# Patient Record
Sex: Female | Born: 1955 | Race: White | Hispanic: No | Marital: Single | Smoking: Never smoker
Health system: Southern US, Community
[De-identification: ages and names within clinical notes are randomized; demographics above are authoritative.]

## PROBLEM LIST (undated history)

## (undated) DIAGNOSIS — G5603 Carpal tunnel syndrome, bilateral upper limbs: Secondary | ICD-10-CM

## (undated) DIAGNOSIS — R7303 Prediabetes: Secondary | ICD-10-CM

## (undated) DIAGNOSIS — Z806 Family history of leukemia: Secondary | ICD-10-CM

## (undated) DIAGNOSIS — C439 Malignant melanoma of skin, unspecified: Secondary | ICD-10-CM

## (undated) DIAGNOSIS — R011 Cardiac murmur, unspecified: Secondary | ICD-10-CM

## (undated) DIAGNOSIS — D509 Iron deficiency anemia, unspecified: Secondary | ICD-10-CM

## (undated) DIAGNOSIS — Z973 Presence of spectacles and contact lenses: Secondary | ICD-10-CM

## (undated) DIAGNOSIS — Z8719 Personal history of other diseases of the digestive system: Secondary | ICD-10-CM

## (undated) DIAGNOSIS — N950 Postmenopausal bleeding: Secondary | ICD-10-CM

## (undated) DIAGNOSIS — M1990 Unspecified osteoarthritis, unspecified site: Secondary | ICD-10-CM

## (undated) DIAGNOSIS — M4722 Other spondylosis with radiculopathy, cervical region: Secondary | ICD-10-CM

## (undated) DIAGNOSIS — K580 Irritable bowel syndrome with diarrhea: Secondary | ICD-10-CM

## (undated) DIAGNOSIS — Z808 Family history of malignant neoplasm of other organs or systems: Secondary | ICD-10-CM

## (undated) DIAGNOSIS — Z923 Personal history of irradiation: Secondary | ICD-10-CM

## (undated) DIAGNOSIS — E7800 Pure hypercholesterolemia, unspecified: Secondary | ICD-10-CM

## (undated) DIAGNOSIS — Z803 Family history of malignant neoplasm of breast: Secondary | ICD-10-CM

## (undated) DIAGNOSIS — Z800 Family history of malignant neoplasm of digestive organs: Secondary | ICD-10-CM

## (undated) HISTORY — DX: Family history of malignant neoplasm of digestive organs: Z80.0

## (undated) HISTORY — DX: Family history of leukemia: Z80.6

## (undated) HISTORY — DX: Family history of malignant neoplasm of breast: Z80.3

## (undated) HISTORY — PX: BREAST BIOPSY: SHX20

## (undated) HISTORY — DX: Family history of malignant neoplasm of other organs or systems: Z80.8

## (undated) HISTORY — PX: TOOTH EXTRACTION: SUR596

## (undated) HISTORY — PX: BREAST FIBROADENOMA SURGERY: SHX580

---

## 1976-10-22 HISTORY — PX: BREAST EXCISIONAL BIOPSY: SUR124

## 2004-12-21 ENCOUNTER — Ambulatory Visit: Payer: Self-pay | Admitting: Obstetrics & Gynecology

## 2005-08-17 ENCOUNTER — Ambulatory Visit: Payer: Self-pay | Admitting: Unknown Physician Specialty

## 2006-02-21 ENCOUNTER — Ambulatory Visit: Payer: Self-pay | Admitting: Unknown Physician Specialty

## 2007-03-11 ENCOUNTER — Ambulatory Visit: Payer: Self-pay | Admitting: Unknown Physician Specialty

## 2008-03-31 ENCOUNTER — Ambulatory Visit: Payer: Self-pay | Admitting: Unknown Physician Specialty

## 2009-04-15 ENCOUNTER — Ambulatory Visit: Payer: Self-pay | Admitting: Unknown Physician Specialty

## 2010-04-14 DIAGNOSIS — D039 Melanoma in situ, unspecified: Secondary | ICD-10-CM

## 2010-04-14 HISTORY — DX: Melanoma in situ, unspecified: D03.9

## 2010-04-28 ENCOUNTER — Ambulatory Visit: Payer: Self-pay | Admitting: Unknown Physician Specialty

## 2010-08-04 ENCOUNTER — Ambulatory Visit: Payer: Self-pay | Admitting: Unknown Physician Specialty

## 2011-09-17 ENCOUNTER — Ambulatory Visit: Payer: Self-pay | Admitting: Unknown Physician Specialty

## 2012-02-19 ENCOUNTER — Ambulatory Visit: Payer: Self-pay | Admitting: Obstetrics and Gynecology

## 2012-09-24 ENCOUNTER — Ambulatory Visit: Payer: Self-pay | Admitting: Family Medicine

## 2012-09-29 ENCOUNTER — Ambulatory Visit: Payer: Self-pay | Admitting: Family Medicine

## 2013-10-12 ENCOUNTER — Ambulatory Visit: Payer: Self-pay | Admitting: Family Medicine

## 2014-11-09 ENCOUNTER — Ambulatory Visit: Payer: Self-pay | Admitting: Nurse Practitioner

## 2015-09-26 ENCOUNTER — Encounter
Admission: RE | Disposition: A | Discharge status: Home / Self Care | Payer: Self-pay | Source: Ambulatory Visit | Attending: Unknown Physician Specialty

## 2015-09-26 ENCOUNTER — Encounter: Payer: Self-pay | Admitting: *Deleted

## 2015-09-26 ENCOUNTER — Ambulatory Visit
Admission: RE | Admit: 2015-09-26 | Discharge: 2015-09-26 | Disposition: A | Discharge status: Home / Self Care | Payer: Federal, State, Local not specified - PPO | Source: Ambulatory Visit | Attending: Unknown Physician Specialty | Admitting: Unknown Physician Specialty

## 2015-09-26 ENCOUNTER — Ambulatory Visit: Payer: Federal, State, Local not specified - PPO | Admitting: Anesthesiology

## 2015-09-26 DIAGNOSIS — Z79899 Other long term (current) drug therapy: Secondary | ICD-10-CM | POA: Diagnosis not present

## 2015-09-26 DIAGNOSIS — Q438 Other specified congenital malformations of intestine: Secondary | ICD-10-CM | POA: Diagnosis not present

## 2015-09-26 DIAGNOSIS — Z1211 Encounter for screening for malignant neoplasm of colon: Secondary | ICD-10-CM | POA: Diagnosis present

## 2015-09-26 DIAGNOSIS — Z7982 Long term (current) use of aspirin: Secondary | ICD-10-CM | POA: Insufficient documentation

## 2015-09-26 DIAGNOSIS — K640 First degree hemorrhoids: Secondary | ICD-10-CM | POA: Diagnosis not present

## 2015-09-26 DIAGNOSIS — Z85820 Personal history of malignant melanoma of skin: Secondary | ICD-10-CM | POA: Insufficient documentation

## 2015-09-26 DIAGNOSIS — Z800 Family history of malignant neoplasm of digestive organs: Secondary | ICD-10-CM | POA: Diagnosis present

## 2015-09-26 HISTORY — DX: Malignant melanoma of skin, unspecified: C43.9

## 2015-09-26 HISTORY — PX: COLONOSCOPY: SHX5424

## 2015-09-26 SURGERY — COLONOSCOPY
Anesthesia: General

## 2015-09-26 MED ORDER — SODIUM CHLORIDE 0.9 % IV SOLN: INTRAVENOUS | Status: DC

## 2015-09-26 MED ORDER — LIDOCAINE HCL (PF) 2 % IJ SOLN
INTRAMUSCULAR | Status: DC | PRN
  Administered 2015-09-26: 50 mg

## 2015-09-26 MED ORDER — PROPOFOL 10 MG/ML IV BOLUS
INTRAVENOUS | Status: DC | PRN
  Administered 2015-09-26: 30 mg via INTRAVENOUS
  Administered 2015-09-26: 20 mg via INTRAVENOUS

## 2015-09-26 MED ORDER — FENTANYL CITRATE (PF) 100 MCG/2ML IJ SOLN
INTRAMUSCULAR | Status: DC | PRN
  Administered 2015-09-26: 50 ug via INTRAVENOUS

## 2015-09-26 MED ORDER — SODIUM CHLORIDE 0.9 % IV SOLN
INTRAVENOUS | Status: DC
  Administered 2015-09-26: 14:00:00 via INTRAVENOUS

## 2015-09-26 MED ORDER — PROPOFOL 500 MG/50ML IV EMUL
INTRAVENOUS | Status: DC | PRN
  Administered 2015-09-26: 100 ug/kg/min via INTRAVENOUS

## 2015-09-26 MED ORDER — MIDAZOLAM HCL 5 MG/5ML IJ SOLN
INTRAMUSCULAR | Status: DC | PRN
  Administered 2015-09-26: 1 mg via INTRAVENOUS

## 2015-09-26 NOTE — Transfer of Care (Signed)
Immediate Anesthesia Transfer of Care Note  Patient: Meghan Harper  Procedure(s) Performed: Procedure(s): COLONOSCOPY (N/A)  Patient Location: PACU  Anesthesia Type:General  Level of Consciousness: sedated  Airway & Oxygen Therapy: Patient Spontanous Breathing and Patient connected to nasal cannula oxygen  Post-op Assessment: Report given to RN and Post -op Vital signs reviewed and stable  Post vital signs: Reviewed and stable  Last Vitals:  Filed Vitals:   09/26/15 1344 09/26/15 1520  BP: 145/72 99/66  Pulse: 72 71  Temp: 37.6 C 36.4 C  Resp: 18 15    Complications: No apparent anesthesia complications

## 2015-09-26 NOTE — Op Note (Signed)
[facility] Patient Name: Meghan Harper Procedure Date: 09/26/2015 2:44 PM MRN: [id number] Account #: [id number] Date of Birth: 08/01/1956 Admit Type: Outpatient Age: 59 Room: [facility] ENDO ROOM 1 Gender: Female Note Status: Finalized Procedure:         Colonoscopy Indications:       Screening in patient at increased risk: Family history of                     1st-degree relative with colorectal cancer Providers:         Manya Silvas, MD Medicines:         Propofol per Anesthesia Complications:     No immediate complications. Procedure:         Pre-Anesthesia Assessment:                    - After reviewing the risks and benefits, the patient was                     deemed in satisfactory condition to undergo the procedure.                    After obtaining informed consent, the colonoscope was                     passed under direct vision. Throughout the procedure, the                     patient's blood pressure, pulse, and oxygen saturations                     were monitored continuously. The Olympus PCF-H180AL                     colonoscope ( S#: [id number] ) was introduced through the                     anus and advanced to the the cecum, identified by                     appendiceal orifice and ileocecal valve. The colonoscopy                     was somewhat difficult due to significant looping.                     Successful completion of the procedure was aided by                     applying abdominal pressure. The patient tolerated the                     procedure well. The quality of the bowel preparation was                     good. Findings:      The colon was long and redundant requiring external abd pressure. Cecum       reched wih certainty.      Internal hemorrhoids were found during endoscopy. The hemorrhoids were       small and Grade I (internal hemorrhoids that do not prolapse).      The exam was otherwise without  abnormality. Impression:        - Internal hemorrhoids.                    -  The examination was otherwise normal.                    - No specimens collected. Recommendation:    - Repeat colonoscopy in 5 years for screening purposes. Manya Silvas, MD 09/26/2015 3:18:58 PM This report has been signed electronically. Number of Addenda: 0 Note Initiated On: 09/26/2015 2:44 PM Scope Withdrawal Time: 0 hours 12 minutes 58 seconds  Total Procedure Duration: 0 hours 22 minutes 41 seconds       [facility]

## 2015-09-26 NOTE — H&P (Signed)
   Primary Care Physician:  No PCP Per Patient Primary Gastroenterologist:  Dr. Vira Agar  Pre-Procedure History & Physical: HPI:  Meghan Harper is a 59 y.o. female is here for an colonoscopy.   Past Medical History  Diagnosis Date  . Melanoma [facility])     Past Surgical History  Procedure Laterality Date  . Breast fibroadenoma surgery      Prior to Admission medications   Medication Sig Start Date End Date Taking? Authorizing Provider  aspirin EC 81 MG tablet Take 81 mg by mouth daily.   Yes Historical Provider, MD  Calcium Carb-Cholecalciferol (CALCIUM 600 + D PO) Take 2 tablets by mouth daily.   Yes Historical Provider, MD  estradiol (VIVELLE-DOT) 0.1 MG/24HR patch Place 1 patch onto the skin 2 (two) times a week.   Yes Historical Provider, MD  ipratropium-albuterol (DUONEB) 0.5-2.5 (3) MG/3ML SOLN Take 3 mLs by nebulization.   Yes Historical Provider, MD  medroxyPROGESTERone (DEPO-PROVERA) 150 MG/ML injection Inject 150 mg into the muscle every 3 (three) months.   Yes Historical Provider, MD  Multiple Vitamin (MULTIVITAMIN) capsule Take 1 capsule by mouth daily.   Yes Historical Provider, MD  naproxen sodium (ANAPROX) 220 MG tablet Take 220 mg by mouth 2 (two) times daily with a meal.   Yes Historical Provider, MD  Omega-3 Fatty Acids (FISH OIL) 1000 MG CAPS Take 1 capsule by mouth daily.   Yes Historical Provider, MD  rosuvastatin (CRESTOR) 5 MG tablet Take 5 mg by mouth daily.   Yes Historical Provider, MD  traZODone (DESYREL) 25 mg TABS tablet Take 25 mg by mouth at bedtime as needed for sleep.   Yes Historical Provider, MD    Allergies as of 08/12/2015  . (Not on File)    History reviewed. No pertinent family history.  Social History   Social History  . Marital Status: Single    Spouse Name: N/A  . Number of Children: N/A  . Years of Education: N/A   Occupational History  . Not on file.   Social History Main Topics  . Smoking status: Never Smoker   . Smokeless  tobacco: Not on file  . Alcohol Use: 0.6 oz/week    1 Glasses of wine per week  . Drug Use: No  . Sexual Activity: Not on file   Other Topics Concern  . Not on file   Social History Narrative    Review of Systems: See HPI, otherwise negative ROS  Physical Exam: BP 145/72 mmHg  Pulse 72  Temp(Src) 99.6 F (37.6 C) (Tympanic)  Resp 18  Ht 5\' 4"  (1.626 m)  Wt 79.833 kg (176 lb)  BMI 30.20 kg/m2  SpO2 100% General:   Alert,  pleasant and cooperative in NAD Head:  Normocephalic and atraumatic. Neck:  Supple; no masses or thyromegaly. Lungs:  Clear throughout to auscultation.    Heart:  Regular rate and rhythm. Abdomen:  Soft, nontender and nondistended. Normal bowel sounds, without guarding, and without rebound.   Neurologic:  Alert and  oriented x4;  grossly normal neurologically.  Impression/Plan: Meghan Harper is here for an colonoscopy to be performed for [facility] in mother  Risks, benefits, limitations, and alternatives regarding  colonoscopy have been reviewed with the patient.  Questions have been answered.  All parties agreeable.   Gaylyn Cheers, MD  09/26/2015, 2:46 PM

## 2015-09-26 NOTE — Anesthesia Preprocedure Evaluation (Signed)
Anesthesia Evaluation  Patient identified by MRN, date of birth, ID band Patient awake    Reviewed: Allergy & Precautions, H&P , NPO status , Patient's Chart, lab work & pertinent test results, reviewed documented beta blocker date and time   Airway Mallampati: II  TM Distance: >3 FB Neck ROM: full    Dental no notable dental hx. (+) Caps   Pulmonary neg pulmonary ROS,    Pulmonary exam normal breath sounds clear to auscultation       Cardiovascular Exercise Tolerance: Good negative cardio ROS Normal cardiovascular exam Rhythm:regular Rate:Normal     Neuro/Psych negative neurological ROS  negative psych ROS   GI/Hepatic negative GI ROS, Neg liver ROS,   Endo/Other  negative endocrine ROS  Renal/GU negative Renal ROS  negative genitourinary   Musculoskeletal   Abdominal   Peds  Hematology negative hematology ROS (+)   Anesthesia Other Findings Past Medical History:   Melanoma ([place])                                               Reproductive/Obstetrics negative OB ROS                             Anesthesia Physical Anesthesia Plan  ASA: I  Anesthesia Plan: General   Post-op Pain Management:    Induction:   Airway Management Planned:   Additional Equipment:   Intra-op Plan:   Post-operative Plan:   Informed Consent: I have reviewed the patients History and Physical, chart, labs and discussed the procedure including the risks, benefits and alternatives for the proposed anesthesia with the patient or authorized representative who has indicated his/her understanding and acceptance.   Dental Advisory Given  Plan Discussed with: Anesthesiologist, CRNA and Surgeon  Anesthesia Plan Comments:         Anesthesia Quick Evaluation

## 2015-09-28 NOTE — Anesthesia Postprocedure Evaluation (Signed)
Anesthesia Post Note  Patient: Meghan Harper  Procedure(s) Performed: Procedure(s) (LRB): COLONOSCOPY (N/A)  Patient location during evaluation: Endoscopy Anesthesia Type: General Level of consciousness: awake and alert Pain management: pain level controlled Vital Signs Assessment: post-procedure vital signs reviewed and stable Respiratory status: spontaneous breathing, nonlabored ventilation, respiratory function stable and patient connected to nasal cannula oxygen Cardiovascular status: blood pressure returned to baseline and stable Postop Assessment: no signs of nausea or vomiting Anesthetic complications: no    Last Vitals:  Filed Vitals:   09/26/15 1540 09/26/15 1550  BP: 125/73 124/72  Pulse: 71 66  Temp:    Resp: 19 18    Last Pain:  Filed Vitals:   09/27/15 0805  PainSc: 0-No pain                 Martha Clan

## 2016-02-27 DIAGNOSIS — E7800 Pure hypercholesterolemia, unspecified: Secondary | ICD-10-CM | POA: Insufficient documentation

## 2016-02-27 DIAGNOSIS — M19042 Primary osteoarthritis, left hand: Secondary | ICD-10-CM | POA: Insufficient documentation

## 2016-02-27 DIAGNOSIS — M4722 Other spondylosis with radiculopathy, cervical region: Secondary | ICD-10-CM | POA: Insufficient documentation

## 2016-04-27 ENCOUNTER — Other Ambulatory Visit: Payer: Self-pay | Admitting: Internal Medicine

## 2016-04-27 DIAGNOSIS — Z1231 Encounter for screening mammogram for malignant neoplasm of breast: Secondary | ICD-10-CM

## 2016-05-15 ENCOUNTER — Ambulatory Visit
Admission: RE | Admit: 2016-05-15 | Discharge: 2016-05-15 | Disposition: A | Discharge status: Home / Self Care | Payer: Federal, State, Local not specified - PPO | Source: Ambulatory Visit | Attending: Internal Medicine | Admitting: Internal Medicine

## 2016-05-15 ENCOUNTER — Other Ambulatory Visit: Payer: Self-pay | Admitting: Internal Medicine

## 2016-05-15 DIAGNOSIS — Z1231 Encounter for screening mammogram for malignant neoplasm of breast: Secondary | ICD-10-CM | POA: Diagnosis present

## 2017-05-05 DIAGNOSIS — J309 Allergic rhinitis, unspecified: Secondary | ICD-10-CM | POA: Insufficient documentation

## 2017-07-29 ENCOUNTER — Ambulatory Visit
Admission: RE | Admit: 2017-07-29 | Discharge: 2017-07-29 | Disposition: A | Discharge status: Home / Self Care | Payer: Federal, State, Local not specified - PPO | Source: Ambulatory Visit | Attending: Internal Medicine | Admitting: Internal Medicine

## 2017-07-29 ENCOUNTER — Other Ambulatory Visit: Payer: Self-pay | Admitting: Internal Medicine

## 2017-07-29 DIAGNOSIS — Z1231 Encounter for screening mammogram for malignant neoplasm of breast: Secondary | ICD-10-CM | POA: Diagnosis present

## 2017-08-02 DIAGNOSIS — R7303 Prediabetes: Secondary | ICD-10-CM | POA: Insufficient documentation

## 2017-08-02 DIAGNOSIS — N950 Postmenopausal bleeding: Secondary | ICD-10-CM | POA: Insufficient documentation

## 2017-08-02 DIAGNOSIS — Z780 Asymptomatic menopausal state: Secondary | ICD-10-CM | POA: Insufficient documentation

## 2017-08-28 ENCOUNTER — Ambulatory Visit: Payer: Federal, State, Local not specified - PPO | Admitting: Anesthesiology

## 2017-08-28 ENCOUNTER — Ambulatory Visit
Admission: RE | Admit: 2017-08-28 | Discharge: 2017-08-28 | Disposition: A | Discharge status: Home / Self Care | Payer: Federal, State, Local not specified - PPO | Source: Ambulatory Visit | Attending: Surgery | Admitting: Surgery

## 2017-08-28 ENCOUNTER — Encounter
Admission: RE | Disposition: A | Discharge status: Home / Self Care | Payer: Self-pay | Source: Ambulatory Visit | Attending: Surgery

## 2017-08-28 DIAGNOSIS — Z79899 Other long term (current) drug therapy: Secondary | ICD-10-CM | POA: Diagnosis not present

## 2017-08-28 DIAGNOSIS — Z820 Family history of epilepsy and other diseases of the nervous system: Secondary | ICD-10-CM | POA: Insufficient documentation

## 2017-08-28 DIAGNOSIS — Z8249 Family history of ischemic heart disease and other diseases of the circulatory system: Secondary | ICD-10-CM | POA: Insufficient documentation

## 2017-08-28 DIAGNOSIS — Z85820 Personal history of malignant melanoma of skin: Secondary | ICD-10-CM | POA: Insufficient documentation

## 2017-08-28 DIAGNOSIS — M1990 Unspecified osteoarthritis, unspecified site: Secondary | ICD-10-CM | POA: Diagnosis not present

## 2017-08-28 DIAGNOSIS — Z888 Allergy status to other drugs, medicaments and biological substances status: Secondary | ICD-10-CM | POA: Insufficient documentation

## 2017-08-28 DIAGNOSIS — G5601 Carpal tunnel syndrome, right upper limb: Secondary | ICD-10-CM | POA: Insufficient documentation

## 2017-08-28 DIAGNOSIS — E785 Hyperlipidemia, unspecified: Secondary | ICD-10-CM | POA: Diagnosis not present

## 2017-08-28 HISTORY — DX: Unspecified osteoarthritis, unspecified site: M19.90

## 2017-08-28 HISTORY — PX: CARPAL TUNNEL RELEASE: SHX101

## 2017-08-28 SURGERY — RELEASE, CARPAL TUNNEL, ENDOSCOPIC
Anesthesia: Regional | Laterality: Right | Wound class: Clean

## 2017-08-28 MED ORDER — MIDAZOLAM HCL 2 MG/2ML IJ SOLN
INTRAMUSCULAR | Status: DC | PRN
  Administered 2017-08-28: 2 mg via INTRAVENOUS

## 2017-08-28 MED ORDER — METOCLOPRAMIDE HCL 5 MG/ML IJ SOLN: 5.0000 mg | Freq: Three times a day (TID) | INTRAMUSCULAR | Status: DC | PRN

## 2017-08-28 MED ORDER — ONDANSETRON HCL 4 MG PO TABS: 4.0000 mg | ORAL_TABLET | Freq: Four times a day (QID) | ORAL | Status: DC | PRN

## 2017-08-28 MED ORDER — ONDANSETRON HCL 4 MG/2ML IJ SOLN: 4.0000 mg | Freq: Once | INTRAMUSCULAR | Status: DC | PRN

## 2017-08-28 MED ORDER — FENTANYL CITRATE (PF) 100 MCG/2ML IJ SOLN
INTRAMUSCULAR | Status: DC | PRN
  Administered 2017-08-28: 100 ug via INTRAVENOUS

## 2017-08-28 MED ORDER — PROPOFOL 500 MG/50ML IV EMUL
INTRAVENOUS | Status: DC | PRN
  Administered 2017-08-28: 100 ug/kg/min via INTRAVENOUS

## 2017-08-28 MED ORDER — BUPIVACAINE HCL (PF) 0.5 % IJ SOLN
INTRAMUSCULAR | Status: DC | PRN
  Administered 2017-08-28: 10 mL

## 2017-08-28 MED ORDER — POTASSIUM CHLORIDE IN NACL 20-0.9 MEQ/L-% IV SOLN: INTRAVENOUS | Status: DC

## 2017-08-28 MED ORDER — OXYCODONE HCL 5 MG PO TABS: 5.0000 mg | ORAL_TABLET | Freq: Once | ORAL | Status: DC | PRN

## 2017-08-28 MED ORDER — LIDOCAINE HCL (CARDIAC) 20 MG/ML IV SOLN
INTRAVENOUS | Status: DC | PRN
  Administered 2017-08-28: 50 mg via INTRAVENOUS

## 2017-08-28 MED ORDER — METOCLOPRAMIDE HCL 5 MG PO TABS: 5.0000 mg | ORAL_TABLET | Freq: Three times a day (TID) | ORAL | Status: DC | PRN

## 2017-08-28 MED ORDER — OXYCODONE HCL 5 MG/5ML PO SOLN: 5.0000 mg | Freq: Once | ORAL | Status: DC | PRN

## 2017-08-28 MED ORDER — LACTATED RINGERS IV SOLN
1000.0000 mL | INTRAVENOUS | Status: DC
  Administered 2017-08-28: 1000 mL via INTRAVENOUS

## 2017-08-28 MED ORDER — DEXTROSE 5 % IV SOLN
2000.0000 mg | Freq: Once | INTRAVENOUS | Status: AC
  Administered 2017-08-28: 2000 mg via INTRAVENOUS

## 2017-08-28 MED ORDER — FENTANYL CITRATE (PF) 100 MCG/2ML IJ SOLN: 25.0000 ug | INTRAMUSCULAR | Status: DC | PRN

## 2017-08-28 MED ORDER — ONDANSETRON HCL 4 MG/2ML IJ SOLN: 4.0000 mg | Freq: Four times a day (QID) | INTRAMUSCULAR | Status: DC | PRN

## 2017-08-28 MED ORDER — TRAMADOL HCL 50 MG PO TABS: 50.0000 mg | ORAL_TABLET | Freq: Four times a day (QID) | ORAL | 0 refills | Status: AC | PRN

## 2017-08-28 SURGICAL SUPPLY — 26 items
BANDAGE ELASTIC 2 LF NS (GAUZE/BANDAGES/DRESSINGS) ×2 IMPLANT
BNDG COHESIVE 4X5 TAN STRL (GAUZE/BANDAGES/DRESSINGS) ×2 IMPLANT
BNDG ESMARK 4X12 TAN STRL LF (GAUZE/BANDAGES/DRESSINGS) ×2 IMPLANT
CHLORAPREP W/TINT 26ML (MISCELLANEOUS) ×2 IMPLANT
CORD BIP STRL DISP 12FT (MISCELLANEOUS) ×2 IMPLANT
COVER LIGHT HANDLE UNIVERSAL (MISCELLANEOUS) ×4 IMPLANT
CUFF TOURNIQUET DUAL PORT 18X3 (MISCELLANEOUS) ×2 IMPLANT
DRAPE SURG 17X11 SM STRL (DRAPES) ×2 IMPLANT
GAUZE PETRO XEROFOAM 1X8 (MISCELLANEOUS) ×2 IMPLANT
GAUZE SPONGE 4X4 12PLY STRL (GAUZE/BANDAGES/DRESSINGS) ×2 IMPLANT
GLOVE BIO SURGEON STRL SZ8 (GLOVE) ×2 IMPLANT
GLOVE INDICATOR 8.0 STRL GRN (GLOVE) ×2 IMPLANT
GOWN STRL REUS W/ TWL LRG LVL3 (GOWN DISPOSABLE) ×1 IMPLANT
GOWN STRL REUS W/ TWL XL LVL3 (GOWN DISPOSABLE) ×1 IMPLANT
GOWN STRL REUS W/TWL LRG LVL3 (GOWN DISPOSABLE) ×1
GOWN STRL REUS W/TWL XL LVL3 (GOWN DISPOSABLE) ×1
KIT CARPAL TUNNEL (MISCELLANEOUS) ×1
KIT ESCP INSRT D SLOT CANN KN (MISCELLANEOUS) ×1 IMPLANT
KIT ROOM TURNOVER OR (KITS) ×2 IMPLANT
NS IRRIG 500ML POUR BTL (IV SOLUTION) ×2 IMPLANT
PACK EXTREMITY ARMC (MISCELLANEOUS) ×2 IMPLANT
STOCKINETTE IMPERVIOUS 9X36 MD (GAUZE/BANDAGES/DRESSINGS) ×2 IMPLANT
STRAP BODY AND KNEE 60X3 (MISCELLANEOUS) ×2 IMPLANT
SUT PROLENE 4 0 PS 2 18 (SUTURE) ×2 IMPLANT
SUT VIC AB 3-0 SH 27 (SUTURE)
SUT VIC AB 3-0 SH 27X BRD (SUTURE) IMPLANT

## 2017-08-28 NOTE — Discharge Instructions (Addendum)
General Anesthesia, Adult, Care After These instructions provide you with information about caring for yourself after your procedure. Your health care provider may also give you more specific instructions. Your treatment has been planned according to current medical practices, but problems sometimes occur. Call your health care provider if you have any problems or questions after your procedure. What can I expect after the procedure? After the procedure, it is common to have:  Vomiting.  A sore throat.  Mental slowness.  It is common to feel:  Nauseous.  Cold or shivery.  Sleepy.  Tired.  Sore or achy, even in parts of your body where you did not have surgery.  Follow these instructions at home: For at least 24 hours after the procedure:  Do not: ? Participate in activities where you could fall or become injured. ? Drive. ? Use heavy machinery. ? Drink alcohol. ? Take sleeping pills or medicines that cause drowsiness. ? Make important decisions or sign legal documents. ? Take care of children on your own.  Rest. Eating and drinking  If you vomit, drink water, juice, or soup when you can drink without vomiting.  Drink enough fluid to keep your urine clear or pale yellow.  Make sure you have little or no nausea before eating solid foods.  Follow the diet recommended by your health care provider. General instructions  Have a responsible adult stay with you until you are awake and alert.  Return to your normal activities as told by your health care provider. Ask your health care provider what activities are safe for you.  Take over-the-counter and prescription medicines only as told by your health care provider.  If you smoke, do not smoke without supervision.  Keep all follow-up visits as told by your health care provider. This is important. Contact a health care provider if:  You continue to have nausea or vomiting at home, and medicines are not helpful.  You  cannot drink fluids or start eating again.  You cannot urinate after 8-12 hours.  You develop a skin rash.  You have fever.  You have increasing redness at the site of your procedure. Get help right away if:  You have difficulty breathing.  You have chest pain.  You have unexpected bleeding.  You feel that you are having a life-threatening or urgent problem. This information is not intended to replace advice given to you by your health care provider. Make sure you discuss any questions you have with your health care provider. Document Released: 01/14/2001 Document Revised: 03/12/2016 Document Reviewed: 09/22/2015 Elsevier Interactive Patient Education  2018 [employer].   Keep dressing dry and intact. Keep hand elevated above heart level. May shower after dressing removed on postop day 4 (Sunday). Cover sutures with Band-Aid after drying off. Apply ice to affected area frequently. Take naproxen 440 mg BID with meals for 7-10 days, then as necessary. Take ES Tylenol or pain medication as prescribed when needed.  Return for follow-up in 10-14 days or as scheduled.

## 2017-08-28 NOTE — Anesthesia Procedure Notes (Signed)
Performed by: Daden Mahany, CRNA Pre-anesthesia Checklist: Patient identified, Emergency Drugs available, Suction available, Timeout performed and Patient being monitored Patient Re-evaluated:Patient Re-evaluated prior to induction Oxygen Delivery Method: Simple face mask Placement Confirmation: positive ETCO2       

## 2017-08-28 NOTE — Transfer of Care (Signed)
Immediate Anesthesia Transfer of Care Note  Patient: Meghan Harper  Procedure(s) Performed: CARPAL TUNNEL RELEASE ENDOSCOPIC (Right )  Patient Location: PACU  Anesthesia Type: Bier Block  Level of Consciousness: awake, alert  and patient cooperative  Airway and Oxygen Therapy: Patient Spontanous Breathing and Patient connected to supplemental oxygen  Post-op Assessment: Post-op Vital signs reviewed, Patient's Cardiovascular Status Stable, Respiratory Function Stable, Patent Airway and No signs of Nausea or vomiting  Post-op Vital Signs: Reviewed and stable  Complications: No apparent anesthesia complications

## 2017-08-28 NOTE — Anesthesia Postprocedure Evaluation (Signed)
Anesthesia Post Note  Patient: Meghan Harper  Procedure(s) Performed: CARPAL TUNNEL RELEASE ENDOSCOPIC (Right )  Patient location during evaluation: PACU Anesthesia Type: Bier Block Level of consciousness: awake and alert Pain management: pain level controlled Vital Signs Assessment: post-procedure vital signs reviewed and stable Respiratory status: spontaneous breathing, nonlabored ventilation, respiratory function stable and patient connected to nasal cannula oxygen Cardiovascular status: stable and blood pressure returned to baseline Postop Assessment: no apparent nausea or vomiting Anesthetic complications: no    Alisa Graff

## 2017-08-28 NOTE — Anesthesia Procedure Notes (Signed)
Anesthesia Regional Block: Bier block (IV Regional)   Pre-Anesthetic Checklist: ,, timeout performed, Correct Patient, Correct Site, Correct Laterality, Correct Procedure, Correct Position, site marked, Risks and benefits discussed, Surgical consent,  Pre-op evaluation,  At surgeon's request  Laterality: Right      Procedures:,,,,, intact distal pulses, Esmarch exsanguination,, #20gu IV placed and double tourniquet utilized  Narrative:  Start time: 08/28/2017 1:44 PM End time: 08/28/2017 1:44 PM Injection made incrementally with aspirations every 40 mL.

## 2017-08-28 NOTE — Op Note (Signed)
08/28/2017  2:22 PM  Patient:   Meghan Harper  Pre-Op Diagnosis:   Right carpal tunnel syndrome.  Post-Op Diagnosis:   Same.  Procedure:   Endoscopic right carpal tunnel release.  Surgeon:   Pascal Lux, MD  Anesthesia:   Bier block  Findings:   As above.  Complications:   None  EBL:   0 cc  Fluids:   700 cc crystalloid  TT:   29 minutes at 250 mmHg  Drains:   None  Closure:   4-0 Prolene interrupted sutures  Brief Clinical Note:   The patient is a 61 year old female with a history of progressively worsening pain and paresthesias of her right hand.  Her symptoms have persisted despite medications, activity modification, splinting, etc.  Her history and examination are consistent with carpal tunnel syndrome confirmed by EMG.  The patient presents at this time for an endoscopic right carpal tunnel release.   Procedure:   The patient was brought into the operating room and lain in the supine position. After adequate IV sedation was achieved, a timeout was performed to verify the appropriate surgical site before a Bier block was placed by the anesthesiologist and the tourniquet inflated to 250 mmHg. The right hand and upper extremity were prepped with ChloraPrep solution before being draped sterilely. Preoperative antibiotics were administered. An approximately 1.5-2 cm incision was made over the volar wrist flexion crease, centered over the palmaris longus tendon. The incision was carried down through the subcutaneous tissues with care taken to identify and protect any neurovascular structures. The distal forearm fascia was penetrated just proximal to the transverse carpal ligament. The soft tissues were released off the superficial and deep surfaces of the distal forearm fascia and this was released proximally for 3-4 cm under direct visualization.  Attention was directed distally. The Soil scientist was passed beneath the transverse carpal ligament along the ulnar aspect of the  carpal tunnel and used to release any adhesions as well as to remove any adherent synovial tissue before first the smaller then the larger of the two dilators were passed beneath the transverse carpal ligament along the ulnar margin of the carpal tunnel. The slotted cannula was introduced and the endoscope was placed into the slotted cannula and the undersurface of the transverse carpal ligament visualized. The distal margin of the transverse carpal ligament was marked by placing a 25-gauge needle percutaneously at Thayne cardinal point so that it entered the distal portion of the slotted cannula. Under endoscopic visualization, the transverse carpal ligament was released from proximal to distal using the end-cutting blade. A second pass was performed to ensure complete release of the ligament. The adequacy of release was verified both endoscopically and by palpation using the freer elevator.  The wound was irrigated thoroughly with sterile saline solution before being closed using 4-0 Prolene interrupted sutures. A total of 10 cc of 0.5% plain Sensorcaine was injected in and around the incision before a sterile bulky dressing was applied to the wound. The patient was placed into a volar wrist splint before being awakened and returned to the recovery room in satisfactory condition after tolerating the procedure well.

## 2017-08-28 NOTE — H&P (Signed)
Paper H&P to be scanned into permanent record. H&P reviewed and patient re-examined. No changes. 

## 2017-08-28 NOTE — Anesthesia Preprocedure Evaluation (Signed)
Anesthesia Evaluation  Patient identified by MRN, date of birth, ID band Patient awake    Reviewed: Allergy & Precautions, H&P , NPO status , Patient's Chart, lab work & pertinent test results, reviewed documented beta blocker date and time   Airway Mallampati: II  TM Distance: >3 FB Neck ROM: full    Dental no notable dental hx.    Pulmonary neg pulmonary ROS,    Pulmonary exam normal breath sounds clear to auscultation       Cardiovascular Exercise Tolerance: Good negative cardio ROS   Rhythm:regular Rate:Normal     Neuro/Psych negative neurological ROS  negative psych ROS   GI/Hepatic negative GI ROS, Neg liver ROS,   Endo/Other  negative endocrine ROS  Renal/GU negative Renal ROS  negative genitourinary   Musculoskeletal  (+) Arthritis ,   Abdominal   Peds  Hematology negative hematology ROS (+)   Anesthesia Other Findings   Reproductive/Obstetrics negative OB ROS                             Anesthesia Physical Anesthesia Plan  ASA: II  Anesthesia Plan: Bier Block and Bier Block-Lidocaine Only   Post-op Pain Management:    Induction:   PONV Risk Score and Plan: 2  Airway Management Planned:   Additional Equipment:   Intra-op Plan:   Post-operative Plan:   Informed Consent: I have reviewed the patients History and Physical, chart, labs and discussed the procedure including the risks, benefits and alternatives for the proposed anesthesia with the patient or authorized representative who has indicated his/her understanding and acceptance.   Dental Advisory Given  Plan Discussed with: CRNA  Anesthesia Plan Comments:         Anesthesia Quick Evaluation

## 2017-08-29 ENCOUNTER — Encounter: Payer: Self-pay | Admitting: Surgery

## 2017-10-31 ENCOUNTER — Encounter: Payer: Self-pay | Admitting: *Deleted

## 2017-10-31 ENCOUNTER — Other Ambulatory Visit: Payer: Self-pay

## 2017-11-06 ENCOUNTER — Ambulatory Visit
Admission: RE | Admit: 2017-11-06 | Discharge: 2017-11-06 | Disposition: A | Discharge status: Home / Self Care | Payer: Federal, State, Local not specified - PPO | Source: Ambulatory Visit | Attending: Surgery | Admitting: Surgery

## 2017-11-06 ENCOUNTER — Ambulatory Visit: Payer: Federal, State, Local not specified - PPO | Admitting: Anesthesiology

## 2017-11-06 ENCOUNTER — Encounter
Admission: RE | Disposition: A | Discharge status: Home / Self Care | Payer: Self-pay | Source: Ambulatory Visit | Attending: Surgery

## 2017-11-06 DIAGNOSIS — G5602 Carpal tunnel syndrome, left upper limb: Secondary | ICD-10-CM | POA: Diagnosis present

## 2017-11-06 DIAGNOSIS — Z79899 Other long term (current) drug therapy: Secondary | ICD-10-CM | POA: Diagnosis not present

## 2017-11-06 DIAGNOSIS — E785 Hyperlipidemia, unspecified: Secondary | ICD-10-CM | POA: Diagnosis not present

## 2017-11-06 DIAGNOSIS — Z85820 Personal history of malignant melanoma of skin: Secondary | ICD-10-CM | POA: Diagnosis not present

## 2017-11-06 HISTORY — DX: Presence of spectacles and contact lenses: Z97.3

## 2017-11-06 HISTORY — PX: CARPAL TUNNEL RELEASE: SHX101

## 2017-11-06 SURGERY — RELEASE, CARPAL TUNNEL, ENDOSCOPIC
Anesthesia: Regional | Laterality: Left | Wound class: Clean

## 2017-11-06 MED ORDER — FENTANYL CITRATE (PF) 100 MCG/2ML IJ SOLN: 25.0000 ug | INTRAMUSCULAR | Status: DC | PRN

## 2017-11-06 MED ORDER — LACTATED RINGERS IV SOLN
10.0000 mL/h | INTRAVENOUS | Status: DC
  Administered 2017-11-06: 10 mL/h via INTRAVENOUS

## 2017-11-06 MED ORDER — METOCLOPRAMIDE HCL 5 MG/ML IJ SOLN: 5.0000 mg | Freq: Three times a day (TID) | INTRAMUSCULAR | Status: DC | PRN

## 2017-11-06 MED ORDER — LACTATED RINGERS IV SOLN: INTRAVENOUS | Status: DC

## 2017-11-06 MED ORDER — POTASSIUM CHLORIDE IN NACL 20-0.9 MEQ/L-% IV SOLN: INTRAVENOUS | Status: DC

## 2017-11-06 MED ORDER — ONDANSETRON HCL 4 MG PO TABS: 4.0000 mg | ORAL_TABLET | Freq: Four times a day (QID) | ORAL | Status: DC | PRN

## 2017-11-06 MED ORDER — ONDANSETRON HCL 4 MG/2ML IJ SOLN: 4.0000 mg | Freq: Four times a day (QID) | INTRAMUSCULAR | Status: DC | PRN

## 2017-11-06 MED ORDER — OXYCODONE HCL 5 MG PO TABS: 5.0000 mg | ORAL_TABLET | Freq: Once | ORAL | Status: DC | PRN

## 2017-11-06 MED ORDER — ACETAMINOPHEN 10 MG/ML IV SOLN: 1000.0000 mg | Freq: Once | INTRAVENOUS | Status: DC | PRN

## 2017-11-06 MED ORDER — FENTANYL CITRATE (PF) 100 MCG/2ML IJ SOLN
INTRAMUSCULAR | Status: DC | PRN
  Administered 2017-11-06 (×2): 50 ug via INTRAVENOUS

## 2017-11-06 MED ORDER — LIDOCAINE HCL (CARDIAC) 20 MG/ML IV SOLN
INTRAVENOUS | Status: DC | PRN
  Administered 2017-11-06: 40 mg via INTRAVENOUS

## 2017-11-06 MED ORDER — METOCLOPRAMIDE HCL 5 MG PO TABS: 5.0000 mg | ORAL_TABLET | Freq: Three times a day (TID) | ORAL | Status: DC | PRN

## 2017-11-06 MED ORDER — BUPIVACAINE HCL (PF) 0.5 % IJ SOLN
INTRAMUSCULAR | Status: DC | PRN
  Administered 2017-11-06: 10 mL

## 2017-11-06 MED ORDER — MIDAZOLAM HCL 5 MG/5ML IJ SOLN
INTRAMUSCULAR | Status: DC | PRN
  Administered 2017-11-06: 2 mg via INTRAVENOUS

## 2017-11-06 MED ORDER — CEFAZOLIN SODIUM-DEXTROSE 2-4 GM/100ML-% IV SOLN
2.0000 g | Freq: Once | INTRAVENOUS | Status: AC
  Administered 2017-11-06: 2 g via INTRAVENOUS

## 2017-11-06 MED ORDER — ONDANSETRON HCL 4 MG/2ML IJ SOLN: 4.0000 mg | Freq: Once | INTRAMUSCULAR | Status: DC | PRN

## 2017-11-06 MED ORDER — OXYCODONE HCL 5 MG/5ML PO SOLN: 5.0000 mg | Freq: Once | ORAL | Status: DC | PRN

## 2017-11-06 MED ORDER — PROPOFOL 500 MG/50ML IV EMUL
INTRAVENOUS | Status: DC | PRN
  Administered 2017-11-06: 50 ug/kg/min via INTRAVENOUS

## 2017-11-06 SURGICAL SUPPLY — 27 items
BANDAGE ELASTIC 2 LF NS (GAUZE/BANDAGES/DRESSINGS) ×2 IMPLANT
BNDG COHESIVE 4X5 TAN STRL (GAUZE/BANDAGES/DRESSINGS) ×2 IMPLANT
BNDG ESMARK 4X12 TAN STRL LF (GAUZE/BANDAGES/DRESSINGS) ×2 IMPLANT
CHLORAPREP W/TINT 26ML (MISCELLANEOUS) ×2 IMPLANT
CORD BIP STRL DISP 12FT (MISCELLANEOUS) ×2 IMPLANT
COVER LIGHT HANDLE UNIVERSAL (MISCELLANEOUS) ×4 IMPLANT
CUFF TOURNIQUET DUAL PORT 18X3 (MISCELLANEOUS) ×2 IMPLANT
DRAPE SURG 17X11 SM STRL (DRAPES) ×2 IMPLANT
GAUZE PETRO XEROFOAM 1X8 (MISCELLANEOUS) ×2 IMPLANT
GAUZE SPONGE 4X4 12PLY STRL (GAUZE/BANDAGES/DRESSINGS) ×2 IMPLANT
GLOVE BIO SURGEON STRL SZ8 (GLOVE) ×2 IMPLANT
GLOVE INDICATOR 8.0 STRL GRN (GLOVE) ×2 IMPLANT
GOWN STRL REUS W/ TWL LRG LVL3 (GOWN DISPOSABLE) ×1 IMPLANT
GOWN STRL REUS W/ TWL XL LVL3 (GOWN DISPOSABLE) ×1 IMPLANT
GOWN STRL REUS W/TWL LRG LVL3 (GOWN DISPOSABLE) ×1
GOWN STRL REUS W/TWL XL LVL3 (GOWN DISPOSABLE) ×1
KIT CARPAL TUNNEL (MISCELLANEOUS) ×1
KIT ESCP INSRT D SLOT CANN KN (MISCELLANEOUS) ×1 IMPLANT
KIT ROOM TURNOVER OR (KITS) ×2 IMPLANT
NS IRRIG 500ML POUR BTL (IV SOLUTION) ×2 IMPLANT
PACK EXTREMITY ARMC (MISCELLANEOUS) ×2 IMPLANT
SPLINT WRIST M LT TX990308 (SOFTGOODS) ×2 IMPLANT
STOCKINETTE IMPERVIOUS 9X36 MD (GAUZE/BANDAGES/DRESSINGS) ×2 IMPLANT
STRAP BODY AND KNEE 60X3 (MISCELLANEOUS) ×2 IMPLANT
SUT PROLENE 4 0 PS 2 18 (SUTURE) ×2 IMPLANT
SUT VIC AB 3-0 SH 27 (SUTURE)
SUT VIC AB 3-0 SH 27X BRD (SUTURE) IMPLANT

## 2017-11-06 NOTE — Transfer of Care (Signed)
Immediate Anesthesia Transfer of Care Note  Patient: Meghan Harper  Procedure(s) Performed: CARPAL TUNNEL RELEASE ENDOSCOPIC (Left )  Patient Location: PACU  Anesthesia Type: General, Bier Block  Level of Consciousness: awake, alert  and patient cooperative  Airway and Oxygen Therapy: Patient Spontanous Breathing and Patient connected to supplemental oxygen  Post-op Assessment: Post-op Vital signs reviewed, Patient's Cardiovascular Status Stable, Respiratory Function Stable, Patent Airway and No signs of Nausea or vomiting  Post-op Vital Signs: Reviewed and stable  Complications: No apparent anesthesia complications

## 2017-11-06 NOTE — Anesthesia Procedure Notes (Signed)
Performed by: Amish Mintzer, CRNA Pre-anesthesia Checklist: Patient identified, Emergency Drugs available, Suction available, Patient being monitored and Timeout performed Patient Re-evaluated:Patient Re-evaluated prior to induction Oxygen Delivery Method: Simple face mask Placement Confirmation: positive ETCO2 and breath sounds checked- equal and bilateral       

## 2017-11-06 NOTE — H&P (Signed)
Paper H&P to be scanned into permanent record. H&P reviewed and patient re-examined. No changes. 

## 2017-11-06 NOTE — Anesthesia Preprocedure Evaluation (Signed)
Anesthesia Evaluation  Patient identified by MRN, date of birth, ID band Patient awake    Reviewed: Allergy & Precautions, NPO status , Patient's Chart, lab work & pertinent test results  History of Anesthesia Complications Negative for: history of anesthetic complications  Airway Mallampati: I  TM Distance: >3 FB Neck ROM: Full    Dental no notable dental hx.    Pulmonary  Snoring    Pulmonary exam normal breath sounds clear to auscultation       Cardiovascular Exercise Tolerance: Good negative cardio ROS Normal cardiovascular exam Rhythm:Regular Rate:Normal     Neuro/Psych negative neurological ROS     GI/Hepatic negative GI ROS,   Endo/Other  negative endocrine ROS  Renal/GU negative Renal ROS     Musculoskeletal  (+) Arthritis , Osteoarthritis,    Abdominal   Peds  Hematology negative hematology ROS (+)   Anesthesia Other Findings   Reproductive/Obstetrics                             Anesthesia Physical Anesthesia Plan  ASA: II  Anesthesia Plan: General and Bier Block and Bier Block-LIDOCAINE ONLY   Post-op Pain Management:  Regional for Post-op pain and GA combined w/ Regional for post-op pain   Induction: Intravenous  PONV Risk Score and Plan: 2 and TIVA and Ondansetron  Airway Management Planned: Natural Airway  Additional Equipment:   Intra-op Plan:   Post-operative Plan:   Informed Consent: I have reviewed the patients History and Physical, chart, labs and discussed the procedure including the risks, benefits and alternatives for the proposed anesthesia with the patient or authorized representative who has indicated his/her understanding and acceptance.     Plan Discussed with: CRNA  Anesthesia Plan Comments:         Anesthesia Quick Evaluation

## 2017-11-06 NOTE — Discharge Instructions (Signed)
General Anesthesia, Adult, Care After These instructions provide you with information about caring for yourself after your procedure. Your health care provider may also give you more specific instructions. Your treatment has been planned according to current medical practices, but problems sometimes occur. Call your health care provider if you have any problems or questions after your procedure. What can I expect after the procedure? After the procedure, it is common to have:  Vomiting.  A sore throat.  Mental slowness.  It is common to feel:  Nauseous.  Cold or shivery.  Sleepy.  Tired.  Sore or achy, even in parts of your body where you did not have surgery.  Follow these instructions at home: For at least 24 hours after the procedure:  Do not: ? Participate in activities where you could fall or become injured. ? Drive. ? Use heavy machinery. ? Drink alcohol. ? Take sleeping pills or medicines that cause drowsiness. ? Make important decisions or sign legal documents. ? Take care of children on your own.  Rest. Eating and drinking  If you vomit, drink water, juice, or soup when you can drink without vomiting.  Drink enough fluid to keep your urine clear or pale yellow.  Make sure you have little or no nausea before eating solid foods.  Follow the diet recommended by your health care provider. General instructions  Have a responsible adult stay with you until you are awake and alert.  Return to your normal activities as told by your health care provider. Ask your health care provider what activities are safe for you.  Take over-the-counter and prescription medicines only as told by your health care provider.  If you smoke, do not smoke without supervision.  Keep all follow-up visits as told by your health care provider. This is important. Contact a health care provider if:  You continue to have nausea or vomiting at home, and medicines are not helpful.  You  cannot drink fluids or start eating again.  You cannot urinate after 8-12 hours.  You develop a skin rash.  You have fever.  You have increasing redness at the site of your procedure. Get help right away if:  You have difficulty breathing.  You have chest pain.  You have unexpected bleeding.  You feel that you are having a life-threatening or urgent problem. This information is not intended to replace advice given to you by your health care provider. Make sure you discuss any questions you have with your health care provider. Document Released: 01/14/2001 Document Revised: 03/12/2016 Document Reviewed: 09/22/2015 Elsevier Interactive Patient Education  2018 [employer].   Keep dressing dry and intact. Keep hand elevated above heart level. May shower after dressing removed on postop day 4 (Sunday). Cover sutures with Band-Aids after drying off. Apply ice to affected area frequently. Take ibuprofen 600 mg TID or Aleve 2 tabs BID with meals for 7-10 days, then as necessary. Take ES Tylenol or pain medication as prescribed when needed.  Return for follow-up in 10-14 days or as scheduled.

## 2017-11-06 NOTE — Op Note (Signed)
11/06/2017  12:34 PM  Patient:   Meghan Harper  Pre-Op Diagnosis:   Left carpal tunnel syndrome.  Post-Op Diagnosis:   Same.  Procedure:   Endoscopic left carpal tunnel release.  Surgeon:   Pascal Lux, MD  Anesthesia:   Bier block  Findings:   As above.  Complications:   None  EBL:   1 cc  Fluids:   750 cc crystalloid  TT:   40 minutes at 250 mmHg  Drains:   None  Closure:   4-0 Prolene interrupted sutures  Brief Clinical Note:   The patient is a 62 year old female with a history of progressive pain and paresthesias to her left hand.  Her symptoms have persisted despite medications, activity modification, etc.  Her history and examination consistent with carpal tunnel syndrome confirmed by EMG.  The patient presents at this time for an endoscopic left carpal tunnel release.   Procedure:   The patient was brought into the operating room and lain in the supine position. After adequate IV sedation was achieved, a timeout was performed to verify the appropriate surgical site before a Bier block was placed by the anesthesiologist and the tourniquet inflated to 250 mmHg. The left hand and upper extremity were prepped with ChloraPrep solution before being draped sterilely. Preoperative antibiotics were administered. An approximately 1.5-2 cm incision was made over the volar wrist flexion crease, centered over the palmaris longus tendon. The incision was carried down through the subcutaneous tissues with care taken to identify and protect any neurovascular structures. The distal forearm fascia was penetrated just proximal to the transverse carpal ligament. The soft tissues were released off the superficial and deep surfaces of the distal forearm fascia and this was released proximally for 3-4 cm under direct visualization.  Attention was directed distally. The Soil scientist was passed beneath the transverse carpal ligament along the ulnar aspect of the carpal tunnel and used to  release any adhesions as well as to remove any adherent synovial tissue before first the smaller then the larger of the two dilators were passed beneath the transverse carpal ligament along the ulnar margin of the carpal tunnel. The slotted cannula was introduced and the endoscope was placed into the slotted cannula and the undersurface of the transverse carpal ligament visualized. The distal margin of the transverse carpal ligament was marked by placing a 25-gauge needle percutaneously at [place] cardinal point so that it entered the distal portion of the slotted cannula. Under endoscopic visualization, the transverse carpal ligament was released from proximal to distal using the end-cutting blade. A second pass was performed to ensure complete release of the ligament. The adequacy of release was verified both endoscopically and by palpation using the freer elevator.  The wound was irrigated thoroughly with sterile saline solution before being closed using 4-0 Prolene interrupted sutures. A total of 10 cc of 0.5% plain Sensorcaine was injected in and around the incision before a sterile bulky dressing was applied to the wound. The patient was placed into a volar wrist splint before being awakened and returned to the recovery room in satisfactory condition after tolerating the procedure well.

## 2017-11-06 NOTE — Anesthesia Procedure Notes (Addendum)
Anesthesia Regional Block: Bier block (IV Regional)   Pre-Anesthetic Checklist: ,, timeout performed, Correct Patient, Correct Site, Correct Laterality, Correct Procedure, Correct Position, site marked, Risks and benefits discussed,  Surgical consent,  Pre-op evaluation,  At surgeon's request and post-op pain management  Laterality: Left  Prep: alcohol swabs        Procedures:,,,,, intact distal pulses, Esmarch exsanguination,, #20gu IV placed and double tourniquet utilized  Narrative:  Start time: 11/06/2017 11:48 AM End time: 11/06/2017 11:49 AM Injection made incrementally with aspirations every 5 mL.  Performed by: With CRNAs  Anesthesiologist: Darrin Nipper, MD CRNA: Mayme Genta, CRNA  Additional Notes: After esmarch exsanguination, proximal and distal tourniquets inflated. Negative radial pulse noted. Distal cuff deflated. Injected 35mL 0.5% plain Lidocaine. Tolerated well by pt.

## 2017-11-06 NOTE — Anesthesia Postprocedure Evaluation (Signed)
Anesthesia Post Note  Patient: Meghan Harper  Procedure(s) Performed: CARPAL TUNNEL RELEASE ENDOSCOPIC (Left )  Patient location during evaluation: PACU Anesthesia Type: Bier Block Level of consciousness: awake and alert, oriented and patient cooperative Pain management: pain level controlled Vital Signs Assessment: post-procedure vital signs reviewed and stable Respiratory status: spontaneous breathing, nonlabored ventilation and respiratory function stable Cardiovascular status: blood pressure returned to baseline and stable Postop Assessment: adequate PO intake Anesthetic complications: no    Darrin Nipper

## 2018-08-12 ENCOUNTER — Other Ambulatory Visit: Payer: Self-pay | Admitting: Internal Medicine

## 2018-08-12 DIAGNOSIS — Z1231 Encounter for screening mammogram for malignant neoplasm of breast: Secondary | ICD-10-CM

## 2018-09-26 ENCOUNTER — Ambulatory Visit
Admission: RE | Admit: 2018-09-26 | Discharge: 2018-09-26 | Disposition: A | Discharge status: Home / Self Care | Payer: Federal, State, Local not specified - PPO | Source: Ambulatory Visit | Attending: Internal Medicine | Admitting: Internal Medicine

## 2018-09-26 DIAGNOSIS — Z1231 Encounter for screening mammogram for malignant neoplasm of breast: Secondary | ICD-10-CM | POA: Insufficient documentation

## 2018-11-05 IMAGING — MG 2D DIGITAL SCREENING BILATERAL MAMMOGRAM WITH CAD AND ADJUNCT TO
8 of 12 series · 8 of 28 positions shown · non-contrast
Comparison: Previous exam(s).

CLINICAL DATA: Screening.

EXAM:
2D DIGITAL SCREENING BILATERAL MAMMOGRAM WITH CAD AND ADJUNCT TOMO

[R MLO synth-2D]
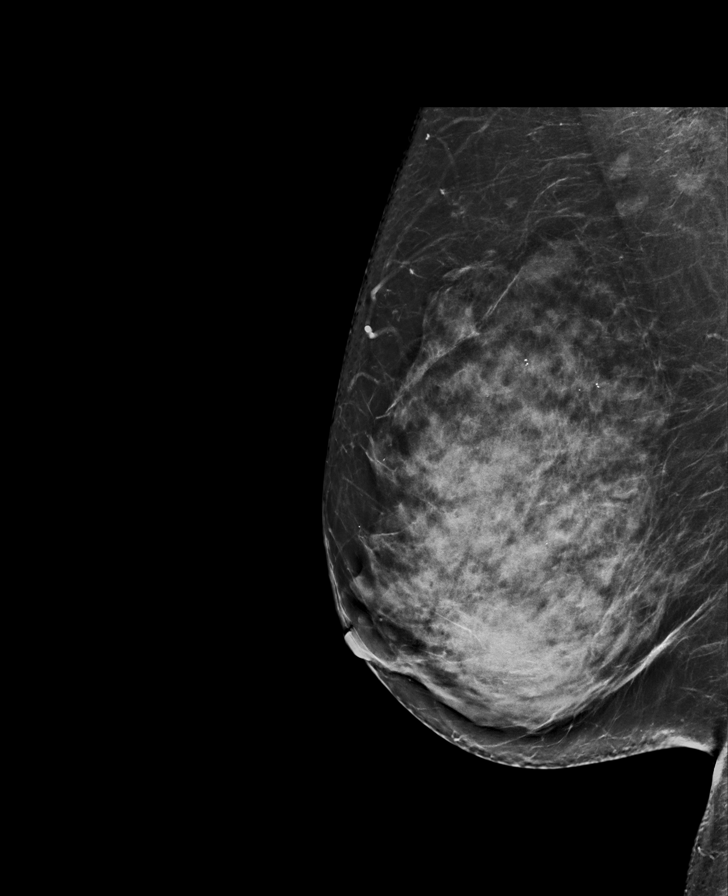

[L MLO synth-2D]
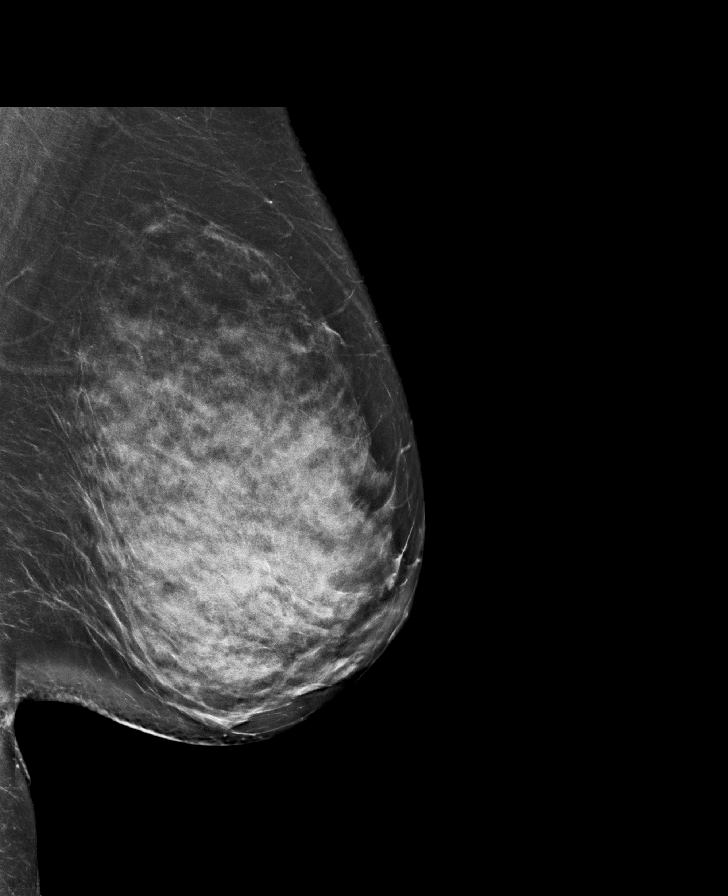

[R CC]
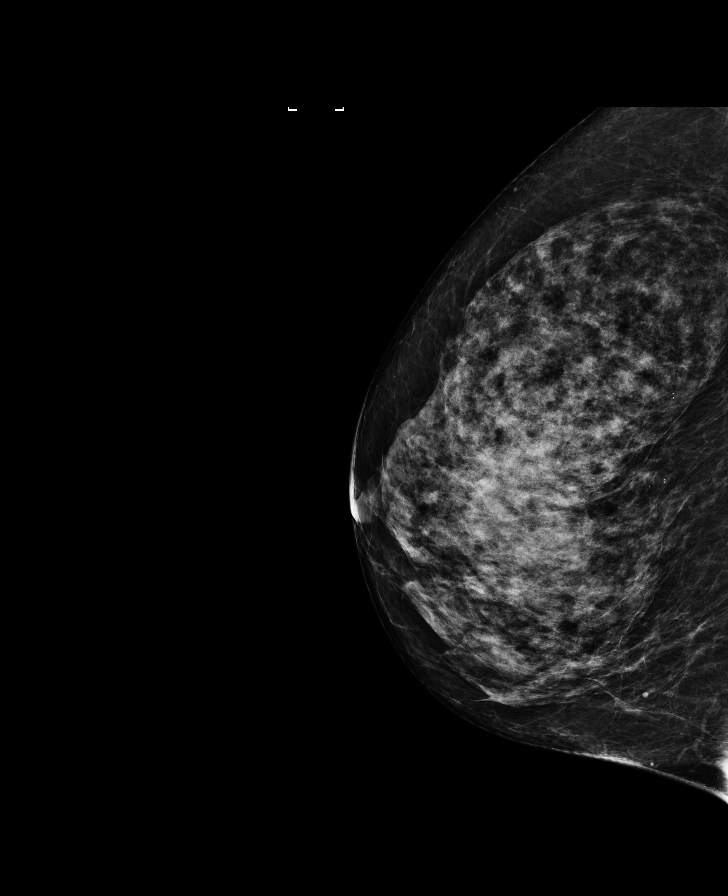

[L CC synth-2D]
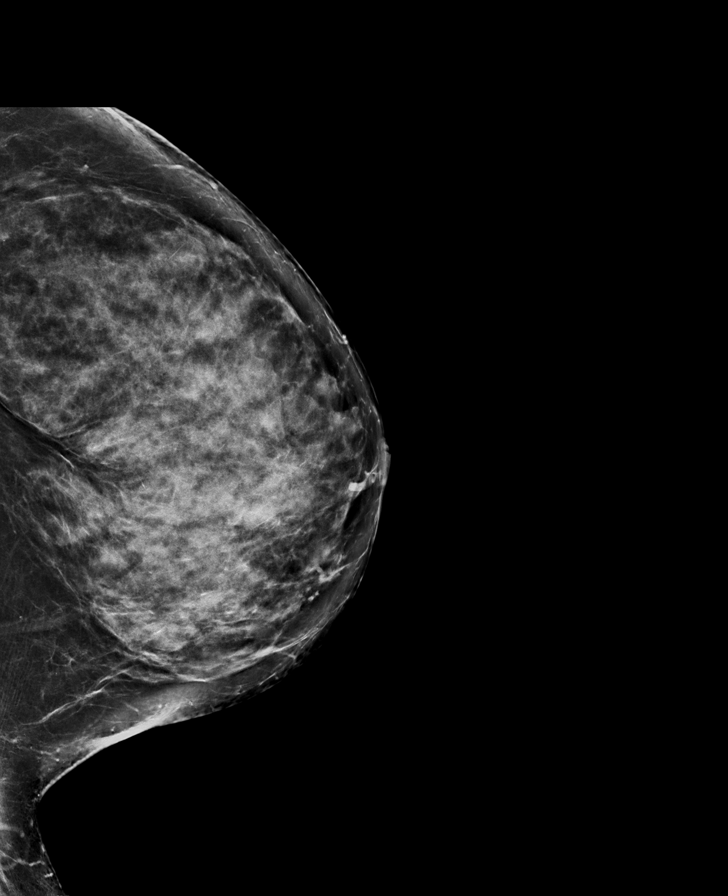

[R CC synth-2D]
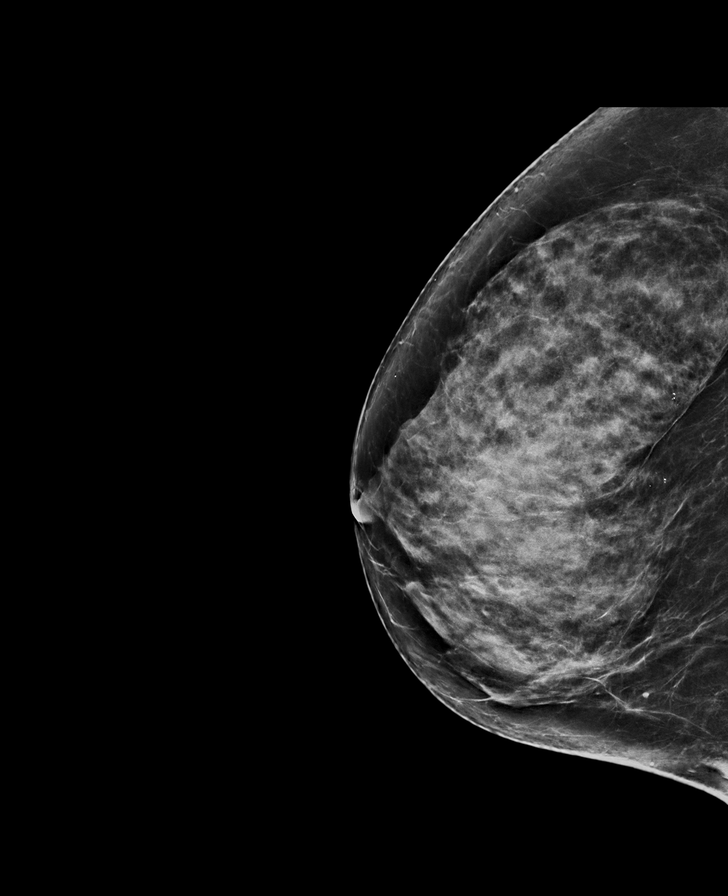

[L MLO]
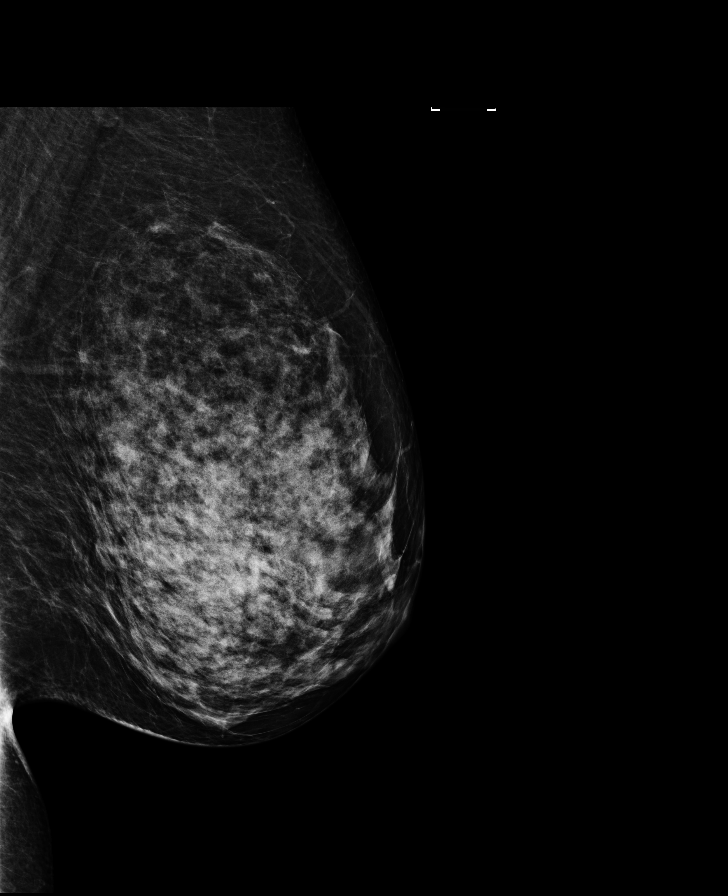

[R MLO]
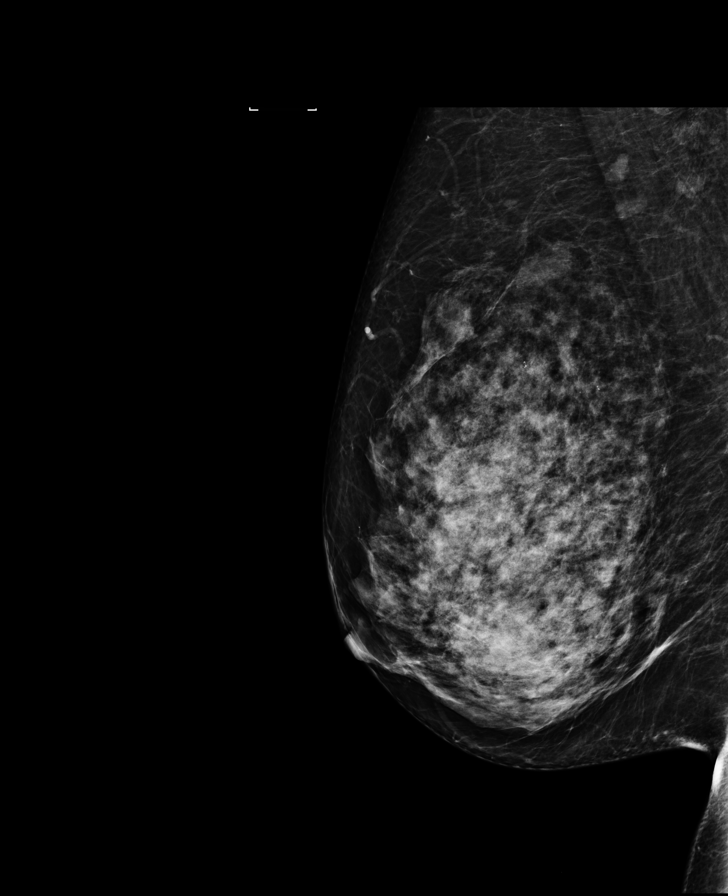

[L CC]
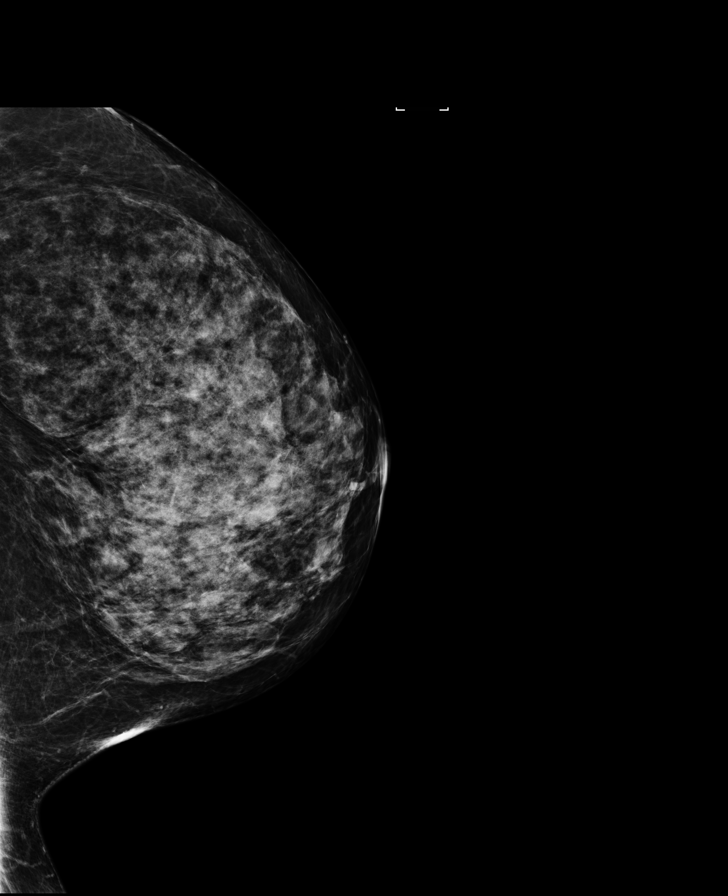

[8 of 28 positions shown; findings below may reference images not displayed]

ACR Breast Density Category d: The breast tissue is extremely dense,
which lowers the sensitivity of mammography.
FINDINGS: There are no findings suspicious for malignancy. Images were
processed with CAD.
IMPRESSION: No mammographic evidence of malignancy. A result letter of this
screening mammogram will be mailed directly to the patient.

RECOMMENDATION:
Screening mammogram in one year. (Code:US-D-RZ7)

BI-RADS CATEGORY  1: Negative.

## 2019-11-09 ENCOUNTER — Other Ambulatory Visit: Payer: Self-pay | Admitting: Physician Assistant

## 2019-11-09 DIAGNOSIS — Z1231 Encounter for screening mammogram for malignant neoplasm of breast: Secondary | ICD-10-CM

## 2019-12-04 ENCOUNTER — Ambulatory Visit
Admission: RE | Admit: 2019-12-04 | Discharge: 2019-12-04 | Disposition: A | Discharge status: Home / Self Care | Payer: Federal, State, Local not specified - PPO | Source: Ambulatory Visit | Attending: Physician Assistant | Admitting: Physician Assistant

## 2019-12-04 DIAGNOSIS — Z1231 Encounter for screening mammogram for malignant neoplasm of breast: Secondary | ICD-10-CM

## 2020-02-17 ENCOUNTER — Other Ambulatory Visit (HOSPITAL_COMMUNITY): Payer: Self-pay | Admitting: Physician Assistant

## 2020-02-17 ENCOUNTER — Ambulatory Visit
Admission: RE | Admit: 2020-02-17 | Discharge: 2020-02-17 | Disposition: A | Discharge status: Home / Self Care | Payer: Federal, State, Local not specified - PPO | Source: Ambulatory Visit | Attending: Physician Assistant | Admitting: Physician Assistant

## 2020-02-17 ENCOUNTER — Other Ambulatory Visit: Payer: Self-pay | Admitting: Physician Assistant

## 2020-02-17 ENCOUNTER — Other Ambulatory Visit: Payer: Self-pay

## 2020-02-17 DIAGNOSIS — R7303 Prediabetes: Secondary | ICD-10-CM | POA: Insufficient documentation

## 2020-02-17 DIAGNOSIS — M79662 Pain in left lower leg: Secondary | ICD-10-CM

## 2020-02-17 DIAGNOSIS — M7989 Other specified soft tissue disorders: Secondary | ICD-10-CM | POA: Diagnosis present

## 2020-06-07 ENCOUNTER — Other Ambulatory Visit: Payer: Self-pay

## 2020-06-07 ENCOUNTER — Ambulatory Visit: Payer: Federal, State, Local not specified - PPO | Admitting: Dermatology

## 2020-06-07 ENCOUNTER — Encounter: Payer: Self-pay | Admitting: Dermatology

## 2020-06-07 DIAGNOSIS — D216 Benign neoplasm of connective and other soft tissue of trunk, unspecified: Secondary | ICD-10-CM | POA: Diagnosis not present

## 2020-06-07 DIAGNOSIS — Z1283 Encounter for screening for malignant neoplasm of skin: Secondary | ICD-10-CM | POA: Diagnosis not present

## 2020-06-07 DIAGNOSIS — D225 Melanocytic nevi of trunk: Secondary | ICD-10-CM

## 2020-06-07 DIAGNOSIS — L738 Other specified follicular disorders: Secondary | ICD-10-CM

## 2020-06-07 DIAGNOSIS — Z86006 Personal history of melanoma in-situ: Secondary | ICD-10-CM

## 2020-06-07 DIAGNOSIS — L814 Other melanin hyperpigmentation: Secondary | ICD-10-CM

## 2020-06-07 DIAGNOSIS — D229 Melanocytic nevi, unspecified: Secondary | ICD-10-CM | POA: Diagnosis not present

## 2020-06-07 DIAGNOSIS — D219 Benign neoplasm of connective and other soft tissue, unspecified: Secondary | ICD-10-CM

## 2020-06-07 DIAGNOSIS — L578 Other skin changes due to chronic exposure to nonionizing radiation: Secondary | ICD-10-CM

## 2020-06-07 DIAGNOSIS — D1800 Hemangioma unspecified site: Secondary | ICD-10-CM

## 2020-06-07 DIAGNOSIS — L821 Other seborrheic keratosis: Secondary | ICD-10-CM

## 2020-06-07 NOTE — Patient Instructions (Signed)

## 2020-06-07 NOTE — Progress Notes (Signed)
   Follow-Up Visit   Subjective  Meghan Harper is a 64 y.o. female who presents for the following: Annual Exam. 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 She has a growth on the bottom of her foot that she would like removed at a later date. She is going on vacation next week.  No other new or changing lesions.  The following portions of the chart were reviewed this encounter and updated as appropriate:      Review of Systems:  No other skin or systemic complaints except as noted in HPI or Assessment and Plan.  Objective  Well appearing patient in no apparent distress; mood and affect are within normal limits.  A full examination was performed including scalp, head, eyes, ears, nose, lips, neck, chest, axillae, abdomen, back, buttocks, bilateral upper extremities, bilateral lower extremities, hands, feet, fingers, toes, fingernails, and toenails. All findings within normal limits unless otherwise noted below.  Objective  Right Upper Back: Well healed scar with no evidence of recurrence.   Objective  Left Plantar 1st Webspace: 4.55mm pink flesh papule  Objective  Left Buttock: 7.0 x 4.56mm medium dark brown macule, Present for years and no change per patient.   Images     Assessment & Plan   Skin cancer screening performed today.  Actinic Damage - diffuse scaly erythematous macules with underlying dyspigmentation - Recommend daily broad spectrum sunscreen SPF 30+ to sun-exposed areas, reapply every 2 hours as needed.  - Call for new or changing lesions.  Seborrheic Keratoses - Stuck-on, waxy, tan-brown papules and plaques  - Discussed benign etiology and prognosis. - Observe - Call for any changes  Lentigines - Scattered tan macules - Discussed due to sun exposure - Benign, observe - Call for any changes  Hemangiomas - Red papules - Discussed benign nature - Observe - Call for any changes  Melanocytic Nevi -  Tan-brown and/or pink-flesh-colored symmetric macules and papules - Benign appearing on exam today - Observation - Call clinic for new or changing moles - Recommend daily use of broad spectrum spf 30+ sunscreen to sun-exposed areas.   Sebaceous Hyperplasia - Small yellow papules with a central dell - Benign - Observe. Discussed ED, not covered by insurance.   History of melanoma in situ Right Upper Back  Clear. Observe for recurrence. Call clinic for new or changing lesions.  Recommend regular skin exams, daily broad-spectrum spf 30+ sunscreen use, and photoprotection.     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 Patient will schedule for shave removal  at a later time.  Nevus Left Buttock  Benign-appearing.  Observation.  Call clinic for new or changing moles.  Recommend daily use of broad spectrum spf 30+ sunscreen to sun-exposed areas.   Return in about 1 year (around 06/07/2021) for TBSE. Patient will schedule for biopsy on her foot at her convenience. Lindi Adie, CMA, am acting as scribe for Brendolyn Patty, MD .  Documentation: I have reviewed the above documentation for accuracy and completeness, and I agree with the above.  Brendolyn Patty MD

## 2020-09-07 ENCOUNTER — Ambulatory Visit: Payer: Federal, State, Local not specified - PPO | Admitting: Dermatology

## 2020-10-22 DIAGNOSIS — C50211 Malignant neoplasm of upper-inner quadrant of right female breast: Secondary | ICD-10-CM

## 2020-10-22 DIAGNOSIS — C50919 Malignant neoplasm of unspecified site of unspecified female breast: Secondary | ICD-10-CM

## 2020-10-22 HISTORY — DX: Malignant neoplasm of unspecified site of unspecified female breast: C50.919

## 2020-10-22 HISTORY — DX: Malignant neoplasm of upper-inner quadrant of right female breast: C50.211

## 2021-03-10 ENCOUNTER — Other Ambulatory Visit: Payer: Self-pay | Admitting: Physician Assistant

## 2021-03-10 DIAGNOSIS — Z1231 Encounter for screening mammogram for malignant neoplasm of breast: Secondary | ICD-10-CM

## 2021-03-16 ENCOUNTER — Other Ambulatory Visit: Payer: Self-pay

## 2021-03-16 ENCOUNTER — Ambulatory Visit
Admission: RE | Admit: 2021-03-16 | Discharge: 2021-03-16 | Disposition: A | Discharge status: Home / Self Care | Payer: Federal, State, Local not specified - PPO | Source: Ambulatory Visit | Attending: Physician Assistant | Admitting: Physician Assistant

## 2021-03-16 DIAGNOSIS — Z1231 Encounter for screening mammogram for malignant neoplasm of breast: Secondary | ICD-10-CM | POA: Diagnosis present

## 2021-03-23 ENCOUNTER — Other Ambulatory Visit: Payer: Self-pay | Admitting: Physician Assistant

## 2021-03-23 DIAGNOSIS — N6310 Unspecified lump in the right breast, unspecified quadrant: Secondary | ICD-10-CM

## 2021-03-23 DIAGNOSIS — R928 Other abnormal and inconclusive findings on diagnostic imaging of breast: Secondary | ICD-10-CM

## 2021-03-30 ENCOUNTER — Ambulatory Visit
Admission: RE | Admit: 2021-03-30 | Discharge: 2021-03-30 | Disposition: A | Discharge status: Home / Self Care | Payer: Federal, State, Local not specified - PPO | Source: Ambulatory Visit | Attending: Physician Assistant | Admitting: Physician Assistant

## 2021-03-30 ENCOUNTER — Other Ambulatory Visit: Payer: Self-pay

## 2021-03-30 DIAGNOSIS — N6310 Unspecified lump in the right breast, unspecified quadrant: Secondary | ICD-10-CM | POA: Diagnosis present

## 2021-03-30 DIAGNOSIS — R928 Other abnormal and inconclusive findings on diagnostic imaging of breast: Secondary | ICD-10-CM | POA: Diagnosis present

## 2021-04-03 ENCOUNTER — Other Ambulatory Visit: Payer: Self-pay | Admitting: Physician Assistant

## 2021-04-03 DIAGNOSIS — N6310 Unspecified lump in the right breast, unspecified quadrant: Secondary | ICD-10-CM

## 2021-04-03 DIAGNOSIS — R928 Other abnormal and inconclusive findings on diagnostic imaging of breast: Secondary | ICD-10-CM

## 2021-04-06 ENCOUNTER — Ambulatory Visit
Admission: RE | Admit: 2021-04-06 | Discharge: 2021-04-06 | Disposition: A | Discharge status: Home / Self Care | Payer: Federal, State, Local not specified - PPO | Source: Ambulatory Visit | Attending: Physician Assistant | Admitting: Physician Assistant

## 2021-04-06 ENCOUNTER — Other Ambulatory Visit: Payer: Self-pay

## 2021-04-06 DIAGNOSIS — N6310 Unspecified lump in the right breast, unspecified quadrant: Secondary | ICD-10-CM

## 2021-04-06 DIAGNOSIS — R928 Other abnormal and inconclusive findings on diagnostic imaging of breast: Secondary | ICD-10-CM | POA: Insufficient documentation

## 2021-04-06 HISTORY — PX: BREAST BIOPSY: SHX20

## 2021-04-10 NOTE — Progress Notes (Signed)
Introduced Materials engineer.  Patient requests consult with Dr. Bary Castilla.  Per office this will be scheduled tomorrow after talking to Dr. Bary Castilla. Patient does not want to schedule Med/Onc appointment until she sees surgeon.  History obtained for case conference.

## 2021-04-12 ENCOUNTER — Other Ambulatory Visit: Payer: Self-pay | Admitting: General Surgery

## 2021-04-12 DIAGNOSIS — C50411 Malignant neoplasm of upper-outer quadrant of right female breast: Secondary | ICD-10-CM

## 2021-04-12 DIAGNOSIS — C50919 Malignant neoplasm of unspecified site of unspecified female breast: Secondary | ICD-10-CM

## 2021-04-12 NOTE — Progress Notes (Signed)
Per Dr. Dwyane Luo request, patient scheduled with Dr. Rogue Bussing for 04/18/21.  Genetic's referral placed.

## 2021-04-14 LAB — SURGICAL PATHOLOGY

## 2021-04-18 ENCOUNTER — Encounter: Payer: Self-pay | Admitting: Licensed Clinical Social Worker

## 2021-04-18 ENCOUNTER — Inpatient Hospital Stay (HOSPITAL_BASED_OUTPATIENT_CLINIC_OR_DEPARTMENT_OTHER): Payer: Federal, State, Local not specified - PPO | Admitting: Licensed Clinical Social Worker

## 2021-04-18 ENCOUNTER — Encounter: Payer: Self-pay | Admitting: Internal Medicine

## 2021-04-18 ENCOUNTER — Encounter (INDEPENDENT_AMBULATORY_CARE_PROVIDER_SITE_OTHER): Payer: Self-pay

## 2021-04-18 ENCOUNTER — Inpatient Hospital Stay: Payer: Federal, State, Local not specified - PPO

## 2021-04-18 ENCOUNTER — Inpatient Hospital Stay: Payer: Federal, State, Local not specified - PPO | Attending: Internal Medicine | Admitting: Internal Medicine

## 2021-04-18 ENCOUNTER — Other Ambulatory Visit: Payer: Self-pay

## 2021-04-18 DIAGNOSIS — Z808 Family history of malignant neoplasm of other organs or systems: Secondary | ICD-10-CM | POA: Insufficient documentation

## 2021-04-18 DIAGNOSIS — C50919 Malignant neoplasm of unspecified site of unspecified female breast: Secondary | ICD-10-CM | POA: Diagnosis not present

## 2021-04-18 DIAGNOSIS — Z800 Family history of malignant neoplasm of digestive organs: Secondary | ICD-10-CM | POA: Insufficient documentation

## 2021-04-18 DIAGNOSIS — C50211 Malignant neoplasm of upper-inner quadrant of right female breast: Secondary | ICD-10-CM

## 2021-04-18 DIAGNOSIS — Z806 Family history of leukemia: Secondary | ICD-10-CM

## 2021-04-18 DIAGNOSIS — Z170 Estrogen receptor positive status [ER+]: Secondary | ICD-10-CM | POA: Diagnosis not present

## 2021-04-18 DIAGNOSIS — Z803 Family history of malignant neoplasm of breast: Secondary | ICD-10-CM

## 2021-04-18 LAB — COMPREHENSIVE METABOLIC PANEL
ALT: 30 U/L (ref 0–44)
AST: 31 U/L (ref 15–41)
Albumin: 4.7 g/dL (ref 3.5–5.0)
Alkaline Phosphatase: 50 U/L (ref 38–126)
Anion gap: 9 (ref 5–15)
BUN: 12 mg/dL (ref 8–23)
CO2: 26 mmol/L (ref 22–32)
Calcium: 9.2 mg/dL (ref 8.9–10.3)
Chloride: 102 mmol/L (ref 98–111)
Creatinine, Ser: 0.66 mg/dL (ref 0.44–1.00)
GFR, Estimated: >60 mL/min (ref >60)
Glucose, Bld: 102 mg/dL — ABNORMAL HIGH (ref 70–99)
Potassium: 4.1 mmol/L (ref 3.5–5.1)
Sodium: 137 mmol/L (ref 135–145)
Total Bilirubin: 0.7 mg/dL (ref 0.3–1.2)
Total Protein: 7.5 g/dL (ref 6.5–8.1)

## 2021-04-18 LAB — CBC WITH DIFFERENTIAL/PLATELET
Abs Immature Granulocytes: 0.01 10*3/uL (ref 0.00–0.07)
Basophils Absolute: 0 10*3/uL (ref 0.0–0.1)
Basophils Relative: 1 %
Eosinophils Absolute: 0.1 10*3/uL (ref 0.0–0.5)
Eosinophils Relative: 2 %
HCT: 40.5 % (ref 36.0–46.0)
Hemoglobin: 13.8 g/dL (ref 12.0–15.0)
Immature Granulocytes: 0 %
Lymphocytes Relative: 26 %
Lymphs Abs: 1.7 10*3/uL (ref 0.7–4.0)
MCH: 30.1 pg (ref 26.0–34.0)
MCHC: 34.1 g/dL (ref 30.0–36.0)
MCV: 88.4 fL (ref 80.0–100.0)
Monocytes Absolute: 0.4 10*3/uL (ref 0.1–1.0)
Monocytes Relative: 7 %
Neutro Abs: 4.2 10*3/uL (ref 1.7–7.7)
Neutrophils Relative %: 64 %
Platelets: 242 10*3/uL (ref 150–400)
RBC: 4.58 MIL/uL (ref 3.87–5.11)
RDW: 11.9 % (ref 11.5–15.5)
WBC: 6.4 10*3/uL (ref 4.0–10.5)
nRBC: 0 % (ref 0.0–0.2)

## 2021-04-18 NOTE — Progress Notes (Signed)
REFERRING PROVIDER: Robert Bellow, MD [place],  [place] [id number]  PRIMARY PROVIDER:  Marinda Elk, MD  PRIMARY REASON FOR VISIT:  1. Invasive carcinoma of breast ([place])   2. Family history of breast cancer   3. Family history of pancreatic cancer   4. Family history of colon cancer   5. Family history of melanoma   6. Family history of CLL (chronic lymphoid leukemia)      HISTORY OF PRESENT ILLNESS:   Meghan Harper, a 65 y.o. female, was seen for a [place] cancer genetics consultation at the request of Dr. Bary Castilla due to a personal and family history of cancer.  Meghan Harper presents to clinic today to discuss the possibility of a hereditary predisposition to cancer, genetic testing, and to further clarify her future cancer risks, as well as potential cancer risks for family members.   In 2022, at the age of 62, [name] was diagnosed with invasive mammary carcinoma of the right breast. The treatment plan is still being determined. Meghan Harper does not believe she change a surgical plan based on genetic test results, but would prefer to have results back quickly just in case.    CANCER HISTORY:  Oncology History   No history exists.     RISK FACTORS:  Menarche was at age 43.  Ovaries intact: yes.  Hysterectomy: no.  Menopausal status: postmenopausal.  HRT use: yes Colonoscopy: yes; normal. Mammogram within the last year: yes. Number of breast biopsies: 3.  Past Medical History:  Diagnosis Date   Arthritis    Family history of breast cancer    Family history of CLL (chronic lymphoid leukemia)    Family history of colon cancer    Family history of melanoma    Family history of pancreatic cancer    Melanoma in situ ([place]) 04/14/2010   Right upper back. 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 Excised: 04/21/2010   Wears contact lenses     Past Surgical History:  Procedure Laterality Date   BREAST BIOPSY Right 1987, 2013   fibroadenoma x 2   BREAST  BIOPSY Right 04/06/2021   u/s bx-"venus" clip-path pendning   BREAST EXCISIONAL BIOPSY Left 1978   BREAST FIBROADENOMA SURGERY     CARPAL TUNNEL RELEASE Right 08/28/2017   Procedure: CARPAL TUNNEL RELEASE ENDOSCOPIC;  Surgeon: Corky Mull, MD;  Location: [facility];  Service: Orthopedics;  Laterality: Right;   CARPAL TUNNEL RELEASE Left 11/06/2017   Procedure: CARPAL TUNNEL RELEASE ENDOSCOPIC;  Surgeon: Corky Mull, MD;  Location: [facility];  Service: Orthopedics;  Laterality: Left;   COLONOSCOPY N/A 09/26/2015   Procedure: COLONOSCOPY;  Surgeon: Manya Silvas, MD;  Location: [facility] ENDOSCOPY;  Service: Endoscopy;  Laterality: N/A;    Social History   Socioeconomic History   Marital status: Single    Spouse name: Not on file   Number of children: Not on file   Years of education: Not on file   Highest education level: Not on file  Occupational History   Not on file  Tobacco Use   Smoking status: Never   Smokeless tobacco: Never  Vaping Use   Vaping Use: Never used  Substance and Sexual Activity   Alcohol use: Yes    Alcohol/week: 0.0 standard drinks    Comment: may have a drink 3-4x/yr   Drug use: No   Sexual activity: Not on file  Other Topics Concern   Not on file  Social History  Narrative   Not on file   Social Determinants of Health   Financial Resource Strain: Not on file  Food Insecurity: Not on file  Transportation Needs: Not on file  Physical Activity: Not on file  Stress: Not on file  Social Connections: Not on file     FAMILY HISTORY:  We obtained a detailed, 4-generation family history.  Significant diagnoses are listed below:  Family History  Problem Relation Age of Onset   Colon cancer Mother        dx 41s   Other Brother        CLL and skin cancers   Breast cancer Maternal Aunt 25   Lung cancer Maternal Aunt    Breast cancer Paternal Aunt 78   Pancreatic cancer Paternal Aunt    Melanoma Maternal Uncle    Breast cancer  Niece        dx 34s, "BRCA pos"     Meghan Harper does not have children. She has 3  brothers. One brother has CLL and has had skin cancers, he worked in [employer]. His daughter had breast cancer in her 32s and reportedly has a BRCA mutation. Patient is unsure if she could get a copy of this report.   Meghan Harper mother had colon cancer in her 63s and died at 54. Patient had 3 maternal aunts, 1 uncle. One aunt had breast cancer postmenopause, then 10 years later had lung cancer and died at 30. Her uncle had melanoma and died in his 73s.Grandmother died at 67, grandfather died at 80.  Meghan Harper father died at 9. Patient had 2 paternal aunts. One had breast cancer postmenopause and then pancreatic cancer 10 years later, she died at  25. Maternal grandmother died at 51, grandfather died early.   Meghan Harper is aware of previous family history of genetic testing for hereditary cancer risks. Patient's maternal ancestors are of English descent, and paternal ancestors are of Scottish/Irish descent. There is no reported Ashkenazi Jewish ancestry. There is no known consanguinity.    GENETIC COUNSELING ASSESSMENT: [name] is a 65 y.o. female with a personal and family history of breast cancer, and family history of a BRCA mutation, which is somewhat suggestive of a hereditary cancer syndrome and predisposition to cancer. We, therefore, discussed and recommended the following at today's visit.   DISCUSSION: We discussed that approximately 5-10% of breast cancer is hereditary. Most cases of hereditary breast cancer are associated with BRCA1/BRCA2 genes, although there are other genes associated with hereditary cancer as well. Cancers and risks are gene specific. We discussed that in order for her to have a chance to have the BRCA mutation in her niece, her brother would also have to have it. [name] does not believe he had genetic testing. If he has it, she would have a 50% chance of also having it.  We  discussed that testing is beneficial for several reasons including surgical decision-making for breast cancer, knowing about other cancer risks, identifying potential screening and risk-reduction options that may be appropriate, and to understand if other family members could be at risk for cancer and allow them to undergo genetic testing.   We reviewed the characteristics, features and inheritance patterns of hereditary cancer syndromes. We also discussed genetic testing, including the appropriate family members to test, the process of testing, insurance coverage and turn-around-time for results. We discussed the implications of a negative, positive and/or variant of uncertain significant result. In order to get genetic test results in  a timely manner so that Ms. Tippets can use these genetic test results for surgical decisions, we recommended Ms. Mione pursue genetic testing for the [facility] panel. Once complete, we recommend Ms. Christoffersen pursue reflex genetic testing to the CancerNext-Expanded+RNA gene panel.   The CancerNext-Expanded + RNAinsight gene panel offered by [employer] and includes sequencing and rearrangement analysis for the following 77 genes: IP, ALK, APC*, ATM*, AXIN2, BAP1, BARD1, BLM, BMPR1A, BRCA1*, BRCA2*, BRIP1*, CDC73, CDH1*,CDK4, CDKN1B, CDKN2A, CHEK2*, CTNNA1, DICER1, FANCC, FH, FLCN, GALNT12, KIF1B, LZTR1, MAX, MEN1, MET, MLH1*, MSH2*, MSH3, MSH6*, MUTYH*, NBN, NF1*, NF2, NTHL1, PALB2*, PHOX2B, PMS2*, POT1, PRKAR1A, PTCH1, PTEN*, RAD51C*, RAD51D*,RB1, RECQL, RET, SDHA, SDHAF2, SDHB, SDHC, SDHD, SMAD4, SMARCA4, SMARCB1, SMARCE1, STK11, SUFU, TMEM127, TP53*,TSC1, TSC2, VHL and XRCC2 (sequencing and deletion/duplication); EGFR, EGLN1, HOXB13, KIT, MITF, PDGFRA, POLD1 and POLE (sequencing only); EPCAM and GREM1 (deletion/duplication only).  Based on Ms. Banales's personal and family history of cancer, she meets medical criteria for genetic testing. Despite that she meets criteria, she  may still have an out of pocket cost.   PLAN: After considering the risks, benefits, and limitations, Ms. Shorty provided informed consent to pursue genetic testing and the blood sample was sent to [facility] for analysis of the BRCAPlus+CancerNext-Expanded+RNA panel. Results should be available within approximately 5-12 days' time, at which point they will be disclosed by telephone to Ms. Sabatino, as will any additional recommendations warranted by these results. Ms. August will receive a summary of her genetic counseling visit and a copy of her results once available. This information will also be available in Epic.   Ms. Schlotterbeck questions were answered to her satisfaction today. Our contact information was provided should additional questions or concerns arise. Thank you for the referral and allowing [place] to share in the care of your patient.   Faith Rogue, MS, [facility] Genetic Counselor [email or web address] Phone: [phone]  The patient was seen for a total of 40 minutes in face-to-face genetic counseling.  Patient brought her boyfriend, Jenny Reichmann. Dr. Grayland Ormond was available for discussion regarding this case.   _______________________________________________________________________ For Office Staff:  Number of people involved in session: 2 Was an Intern/ student involved with case: no

## 2021-04-18 NOTE — Assessment & Plan Note (Addendum)
#  Clinical stage I breast cancer- ER positive; PR negative; her 2 negative; awaiting MRI for further evaluation-given dense breast.  #  I had a long discussion with the patient in general regarding the treatment options of breast cancer including-surgery; adjuvant radiation; role of adjuvant systemic therapy including-chemotherapy antihormone therapy.  #Based on pending MRI- patient will likely need lumpectomy with sentinel lymph node evaluation; followed by radiation.   # Decision regarding chemotherapy based on final surgical pathology/gene assay. Patient will benefit from antihormone therapy.  However suspect given her lobular disease/ER/PR positive disease-less likely to recommend chemotherapy.  However await further work-up.  #Genetics: will discuss.   Thank you Dr. Bary Castilla for allowing me to participate in the care of your pleasant patient. Please do not hesitate to contact me with questions or concerns in the interim.  Discussed with Sheena. Discussed with Dr. Bary Castilla  # DISPOSITION: #  Labs- cbc/cmp- # follow up TBD- Dr.B

## 2021-04-18 NOTE — Progress Notes (Signed)
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   Oncology History Overview Note  # June 2022- Targeted ultrasound was performed of the RIGHT upper inner breast. At 1:30 8 cm from the nipple, there is an irregular hypoechoic mass with indistinct margins. It measures approximately 10 x 5 x 6 mm. This corresponds to the site of screening mammographic concern.   Targeted ultrasound was performed of the RIGHT axilla. No suspicious axillary lymph nodes are seen.   IMPRESSION: 1. There is a 10 mm mass in the RIGHT upper inner breast which is concerning for malignancy. Recommend ultrasound-guided biopsy for definitive characterization.  DIAGNOSIS:  A. BREAST, RIGHT, 1:30 O'CLOCK 8 CM FROM NIPPLE; ULTRASOUND-GUIDED CORE  BIOPSY:  - INVASIVE MAMMARY CARCINOMA, NO SPECIAL TYPE.   Size of invasive carcinoma: 7 mm in this sample  Histologic grade of invasive carcinoma: Grade 2                       Glandular/tubular differentiation score: 3                       Nuclear pleomorphism score: 2                       Mitotic rate score: 1                       Total score: 6  Ductal carcinoma in situ: Present, intermediate grade  Lymphovascular invasion: Not identified   CASE SUMMARY: BREAST BIOMARKER TESTS  Estrogen Receptor (ER) Status: POSITIVE          Percentage of cells with nuclear positivity: Greater than 90%          Average intensity of staining: Strong   Progesterone Receptor (PgR) Status: NEGATIVE (less than 1%)          Internal control cells present and stain as expected   HER2 (by immunohistochemistry): NEGATIVE (Score 0)   Carcinoma of upper-inner quadrant of right breast in female, estrogen receptor positive ([place])  04/18/2021 Initial Diagnosis    Carcinoma of upper-inner quadrant of right breast in female, estrogen receptor positive ([place])       HISTORY OF PRESENTING ILLNESS:  Meghan Harper 65 y.o.  female female with no prior history of breast cancer/or malignancies has been referred to [place] for further evaluation recommendations for new diagnosis of breast cancer.   Patient states she was found to have an abnormal screening mammogram which led to diagnostic mammogram/ultrasound/followed by biopsy-as summarized above.  Patient denies any chest pain or shortness of breath or cough.  Denies any nausea vomiting.  Evaluated by Dr. Bary Castilla.  She is awaiting MRI of the breast given her dense breasts.  Review of Systems  Constitutional:  Negative for chills, diaphoresis, fever, malaise/fatigue and weight loss.  HENT:  Negative for nosebleeds and sore throat.   Eyes:  Negative for double vision.  Respiratory:  Negative for cough, hemoptysis, sputum production, shortness of breath and wheezing.   Cardiovascular:  Negative for chest pain, palpitations, orthopnea and leg swelling.  Gastrointestinal:  Negative for abdominal pain, blood in stool, constipation, diarrhea, heartburn, melena, nausea and vomiting.  Genitourinary:  Negative for dysuria, frequency and urgency.  Musculoskeletal:  Negative for back pain and joint pain.  Skin: Negative.  Negative for itching and rash.  Neurological:  Negative for dizziness, tingling, focal weakness, weakness and headaches.  Endo/Heme/Allergies:  Does not bruise/bleed easily.  Psychiatric/Behavioral:  Negative for depression. The patient is not nervous/anxious and does not have insomnia.     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 Excised: 04/21/2010   Wears contact lenses     SURGICAL HISTORY: Past Surgical History:  Procedure Laterality Date   BREAST BIOPSY Right 1987, 2013   fibroadenoma x 2   BREAST BIOPSY Right 04/06/2021   u/s bx-"venus" clip-path pendning   BREAST EXCISIONAL BIOPSY Left 1978   BREAST FIBROADENOMA SURGERY     CARPAL TUNNEL RELEASE Right 08/28/2017   Procedure: CARPAL TUNNEL RELEASE ENDOSCOPIC;  Surgeon: Corky Mull, MD;  Location: [place];  Service: Orthopedics;  Laterality: Right;   CARPAL TUNNEL RELEASE Left 11/06/2017   Procedure: CARPAL TUNNEL RELEASE ENDOSCOPIC;  Surgeon: Corky Mull, MD;  Location: [place];  Service: Orthopedics;  Laterality: Left;   COLONOSCOPY N/A 09/26/2015   Procedure: COLONOSCOPY;  Surgeon: Manya Silvas, MD;  Location[facility] ENDOSCOPY;  Service: Endoscopy;  Laterality: N/A;    SOCIAL HISTORY: Social History   Socioeconomic History   Marital status: Single    Spouse name: Not on file   Number of children: Not on file   Years of education: Not on file   Highest education level: Not on file  Occupational History   Not on file  Tobacco Use   Smoking status: Never   Smokeless tobacco: Never  Vaping Use   Vaping Use: Never used  Substance and Sexual Activity   Alcohol use: Yes    Alcohol/week: 0.0 standard drinks    Comment: may have a drink 3-4x/yr   Drug use: No   Sexual activity: Not on file  Other Topics Concern   Not on file  Social History Narrative   Not on file   Social Determinants of Health   Financial Resource Strain: Not on file  Food Insecurity: Not on file  Transportation Needs: Not on file  Physical Activity: Not on file  Stress: Not on file  Social Connections: Not on file  Intimate Partner Violence: Not on file    FAMILY HISTORY: Family History  Problem Relation Age of Onset   Colon cancer Mother        dx 26s   Other Brother        CLL and skin  cancers   Breast cancer Maternal Aunt 60   Lung cancer Maternal Aunt    Breast cancer Paternal Aunt 60   Pancreatic cancer Paternal Aunt    Melanoma Maternal Uncle    Breast cancer Niece        dx 2s, "BRCA pos"    ALLERGIES:  is allergic to meloxicam.  MEDICATIONS:  Current Outpatient Medications  Medication Sig Dispense Refill   Calcium Carb-Cholecalciferol (CALCIUM 600 + D PO) Take 2 tablets by mouth daily.     co-enzyme Q-10 50 MG capsule Take 50 mg by  mouth daily.     fexofenadine (ALLEGRA) 60 MG tablet Take by mouth.     levocetirizine (XYZAL) 5 MG tablet Take 2.5 mg by mouth at bedtime as needed for allergies.     Multiple Vitamin (MULTIVITAMIN) capsule Take 1 capsule by mouth daily.     Omega-3 Fatty Acids (FISH OIL) 1000 MG CAPS Take 1 capsule by mouth daily.     rosuvastatin (CRESTOR) 5 MG tablet Take 5 mg by mouth daily.     Turmeric 500 MG CAPS Take by mouth.     No current facility-administered medications for this visit.      Marland Kitchen  PHYSICAL EXAMINATION: ECOG PERFORMANCE STATUS: 0 - Asymptomatic  Vitals:   04/18/21 1358  BP: (!) 146/79  Pulse: 70  Resp: 16  Temp: 98.2 F (36.8 C)  SpO2: 98%   Filed Weights   04/18/21 1358  Weight: 187 lb 12.8 oz (85.2 kg)    Physical Exam Vitals and nursing note reviewed.  Constitutional:      Comments: Ambulating: Independently;  With family-husband.  HENT:     Head: Normocephalic and atraumatic.     Mouth/Throat:     Pharynx: Oropharynx is clear.  Eyes:     Extraocular Movements: Extraocular movements intact.     Pupils: Pupils are equal, round, and reactive to light.  Cardiovascular:     Rate and Rhythm: Normal rate and regular rhythm.  Pulmonary:     Comments: Decreased breath sounds bilaterally.  Abdominal:     Palpations: Abdomen is soft.  Musculoskeletal:        General: Normal range of motion.     Cervical back: Normal range of motion.  Skin:    General: Skin is warm.  Neurological:      General: No focal deficit present.     Mental Status: She is alert and oriented to person, place, and time.  Psychiatric:        Behavior: Behavior normal.        Judgment: Judgment normal.     LABORATORY DATA:  I have reviewed the data as listed Lab Results  Component Value Date   WBC 6.4 04/18/2021   HGB 13.8 04/18/2021   HCT 40.5 04/18/2021   MCV 88.4 04/18/2021   PLT 242 04/18/2021   Recent Labs    04/18/21 1459  NA 137  K 4.1  CL 102  CO2 26  GLUCOSE 102*  BUN 12  CREATININE 0.66  CALCIUM 9.2  GFRNONAA >60  PROT 7.5  ALBUMIN 4.7  AST 31  ALT 30  ALKPHOS 50  BILITOT 0.7    RADIOGRAPHIC STUDIES: I have personally reviewed the radiological images as listed and agreed with the findings in the report. MR BREAST BILATERAL W WO CONTRAST INC CAD  Result Date: 04/19/2021 CLINICAL DATA:  65 year old female for staging of newly diagnosed RIGHT breast cancer. LABS:  None performed today EXAM: BILATERAL BREAST MRI WITH AND WITHOUT CONTRAST TECHNIQUE: Multiplanar, multisequence MR images of both breasts were obtained prior to and following the intravenous administration of 7.5 ml of Gadavist Three-dimensional MR images were rendered by post-processing of the original MR data on an independent workstation. The three-dimensional MR images were interpreted, and findings are reported in the following complete MRI report for this study. Three dimensional images were evaluated at the independent interpreting workstation using the DynaCAD thin client. COMPARISON:  Prior mammograms and ultrasounds FINDINGS: Breast composition: c. Heterogeneous fibroglandular tissue. Background parenchymal enhancement: Mild Right breast: A 1.3 cm  UPPER INNER RIGHT breast mass containing biopsy clip artifact, middle-posterior depth, compatible with biopsy-proven malignancy. No other suspicious abnormal areas of enhancement are noted. Left breast: No mass or abnormal enhancement. Lymph nodes: No abnormal  appearing lymph nodes. Ancillary findings:  None. IMPRESSION: 1. 1.3 cm biopsy-proven UPPER INNER RIGHT breast malignancy. No evidence of multifocal, multicentric or contralateral malignancy. No abnormal appearing lymph nodes. RECOMMENDATION: Treatment plan BI-RADS CATEGORY  6: Known biopsy-proven malignancy. Electronically Signed   By: Margarette Canada M.D.   On: 04/19/2021 13:13  US BREAST LTD UNI RIGHT INC AXILLA  Result Date: 03/30/2021 CLINICAL DATA:  Callback for RIGHT breast mass with associated architectural distortion. History of excisional biopsy in the RIGHT for fibroadenomas. EXAM: DIGITAL DIAGNOSTIC UNILATERAL RIGHT MAMMOGRAM WITH TOMOSYNTHESIS AND CAD; ULTRASOUND RIGHT BREAST LIMITED TECHNIQUE: Right digital diagnostic mammography and breast tomosynthesis was performed. The images were evaluated with computer-aided detection.; Targeted ultrasound examination of the right breast was performed COMPARISON:  Previous exam(s). ACR Breast Density Category c: The breast tissue is heterogeneously dense, which may obscure small masses. FINDINGS: Spot compression tomosynthesis views confirm persistence of an irregular mass with associated architectural distortion in the RIGHT upper inner breast at posterior depth. It measures approximately 9 mm. On physical exam, firm tissue is noted in the RIGHT upper inner breast. Targeted ultrasound was performed of the RIGHT upper inner breast. At 1:30 8 cm from the nipple, there is an irregular hypoechoic mass with indistinct margins. It measures approximately 10 x 5 x 6 mm. This corresponds to the site of screening mammographic concern. Targeted ultrasound was performed of the RIGHT axilla. No suspicious axillary lymph nodes are seen. IMPRESSION: 1. There is a 10 mm mass in the RIGHT upper inner breast which is concerning for malignancy. Recommend ultrasound-guided biopsy for definitive characterization. 2. No RIGHT axillary adenopathy. RECOMMENDATION: RIGHT breast  ultrasound-guided biopsy x1 I have discussed the findings and recommendations with the patient. If applicable, a reminder letter will be sent to the patient regarding the next appointment. Patient will be scheduled for biopsy at her earliest convenience by the schedulers and ordering provider will be notified. BI-RADS CATEGORY  5: Highly suggestive of malignancy. Electronically Signed   By: Valentino Saxon MD   On: 03/30/2021 15:46  MM DIAG BREAST TOMO UNI RIGHT  Result Date: 03/30/2021 CLINICAL DATA:  Callback for RIGHT breast mass with associated architectural distortion. History of excisional biopsy in the RIGHT for fibroadenomas. EXAM: DIGITAL DIAGNOSTIC UNILATERAL RIGHT MAMMOGRAM WITH TOMOSYNTHESIS AND CAD; ULTRASOUND RIGHT BREAST LIMITED TECHNIQUE: Right digital diagnostic mammography and breast tomosynthesis was performed. The images were evaluated with computer-aided detection.; Targeted ultrasound examination of the right breast was performed COMPARISON:  Previous exam(s). ACR Breast Density Category c: The breast tissue is heterogeneously dense, which may obscure small masses. FINDINGS: Spot compression tomosynthesis views confirm persistence of an irregular mass with associated architectural distortion in the RIGHT upper inner breast at posterior depth. It measures approximately 9 mm. On physical exam, firm tissue is noted in the RIGHT upper inner breast. Targeted ultrasound was performed of the RIGHT upper inner breast. At 1:30 8 cm from the nipple, there is an irregular hypoechoic mass with indistinct margins. It measures approximately 10 x 5 x 6 mm. This corresponds to the site of screening mammographic concern. Targeted ultrasound was performed of the RIGHT axilla. No suspicious axillary lymph nodes are seen. IMPRESSION: 1. There is a 10 mm mass in the RIGHT upper inner breast which is concerning for malignancy. Recommend  ultrasound-guided biopsy for definitive characterization. 2. No RIGHT  axillary adenopathy. RECOMMENDATION: RIGHT breast ultrasound-guided biopsy x1 I have discussed the findings and recommendations with the patient. If applicable, a reminder letter will be sent to the patient regarding the next appointment. Patient will be scheduled for biopsy at her earliest convenience by the schedulers and ordering provider will be notified. BI-RADS CATEGORY  5: Highly suggestive of malignancy. Electronically Signed   By: Valentino Saxon MD   On: 03/30/2021 15:46  MM CLIP PLACEMENT RIGHT  Result Date: 04/06/2021 CLINICAL DATA:  Status post ultrasound-guided biopsy EXAM: DIAGNOSTIC RIGHT MAMMOGRAM POST ULTRASOUND BIOPSY COMPARISON:  Previous exam(s). FINDINGS: Mammographic images were obtained following ultrasound guided biopsy of a mass in the RIGHT upper inner breast. The VENUS biopsy marking clip is in expected position at the site of biopsy. IMPRESSION: Appropriate positioning of the VENUS shaped biopsy marking clip at the site of biopsy in the RIGHT upper inner breast at site of mass with associated architectural distortion. Final Assessment: Post Procedure Mammograms for Marker Placement Electronically Signed   By: Valentino Saxon MD   On: 04/06/2021 08:50  [place] RT BREAST BX W LOC DEV 1ST LESION IMG BX SPEC US GUIDE  Addendum Date: 04/07/2021   ADDENDUM REPORT: 04/07/2021 12:55 ADDENDUM: PATHOLOGY revealed: A. BREAST, RIGHT, 1:30 O'CLOCK 8 CM FROM NIPPLE; ULTRASOUND-GUIDED CORE BIOPSY: - INVASIVE MAMMARY CARCINOMA, NO SPECIAL TYPE. Size of invasive carcinoma: 7 mm in this sample. Grade 2. Ductal carcinoma in situ: Present, intermediate grade. Lymphovascular invasion: Not identified. Comment: The definitive grade will be assigned on the excisional specimen. Pathology results are CONCORDANT with imaging findings, per Dr. Valentino Saxon. Pathology results and recommendations were discussed with patient via telephone on 05/07/2021. Patient reported doing well after the biopsy with no  adverse symptoms, and only slight tenderness at the site. Post biopsy care instructions were reviewed, questions were answered and my direct phone number was provided. Patient was instructed to call [facility] for any additional questions or concerns related to biopsy site. Recommendations: 1. Surgical consultation. Request for surgical consultation relayed to Al Pimple RN and Tanya Nones RN at [facility] by Electa Sniff RN on 04/07/2021. 2. Consider bilateral breast MRI without and with contrast to determine extent of disease given patient's breast density. Pathology results reported by Electa Sniff RN on 04/07/2021. Electronically Signed   By: Valentino Saxon MD   On: 04/07/2021 12:55   Result Date: 04/07/2021 CLINICAL DATA:  Suspicious RIGHT breast mass EXAM: ULTRASOUND GUIDED RIGHT BREAST CORE NEEDLE BIOPSY COMPARISON:  Previous exam(s). PROCEDURE: I met with the patient and we discussed the procedure of ultrasound-guided biopsy, including benefits and alternatives. We discussed the high likelihood of a successful procedure. We discussed the risks of the procedure, including infection, bleeding, tissue injury, clip migration, and inadequate sampling. Informed written consent was given. The usual time-out protocol was performed immediately prior to the procedure. Lesion quadrant: Upper inner quadrant Using sterile technique and 1% lidocaine and 1% lidocaine with epinephrine as local anesthetic, under direct ultrasound visualization, a 14 gauge spring-loaded device was used to perform biopsy of a mass at 1:30 8 cm from the nipple using a medial approach. At the conclusion of the procedure a VENUS tissue marker clip was deployed into the biopsy cavity. Follow up 2 view mammogram was performed and dictated separately. IMPRESSION: Ultrasound guided biopsy of a RIGHT breast mass. No apparent complications. Electronically Signed: By: Valentino Saxon MD On: 04/06/2021 09:52  ASSESSMENT & PLAN:   Carcinoma of upper-inner quadrant of right breast in female, estrogen receptor positive ([place]) #Clinical stage I breast cancer- ER positive; PR negative; her 2 negative; awaiting MRI for further evaluation-given dense breast.  #  I had a long discussion with the patient in general regarding the treatment options of breast cancer including-surgery; adjuvant radiation; role of adjuvant systemic therapy including-chemotherapy antihormone therapy.  #Based on pending MRI- patient will likely need lumpectomy with sentinel lymph node evaluation; followed by radiation.   # Decision regarding chemotherapy based on final surgical pathology/gene assay. Patient will benefit from antihormone therapy.  However suspect given her lobular disease/ER/PR positive disease-less likely to recommend chemotherapy.  However await further work-up.  #Genetics: will discuss.   Thank you Dr. Bary Castilla for allowing me to participate in the care of your pleasant patient. Please do not hesitate to contact me with questions or concerns in the interim.  Discussed with Sheena. Discussed with Dr. Bary Castilla  # DISPOSITION: #  Labs- cbc/cmp- # follow up TBD- Dr.B  All questions were answered. The patient/family knows to call the clinic with any problems, questions or concerns.    Cammie Sickle, MD 04/24/2021 7:37 PM

## 2021-04-19 ENCOUNTER — Telehealth: Payer: Self-pay | Admitting: General Surgery

## 2021-04-19 ENCOUNTER — Ambulatory Visit
Admission: RE | Admit: 2021-04-19 | Discharge: 2021-04-19 | Disposition: A | Discharge status: Home / Self Care | Payer: Federal, State, Local not specified - PPO | Source: Ambulatory Visit | Attending: General Surgery | Admitting: General Surgery

## 2021-04-19 DIAGNOSIS — C50411 Malignant neoplasm of upper-outer quadrant of right female breast: Secondary | ICD-10-CM | POA: Diagnosis present

## 2021-04-19 MED ORDER — GADOBUTROL 1 MMOL/ML IV SOLN
7.5000 mL | Freq: Once | INTRAVENOUS | Status: AC | PRN
  Administered 2021-04-19: 7.5 mL via INTRAVENOUS

## 2021-04-19 NOTE — Telephone Encounter (Signed)
Message left that MRI showed no additional findings.

## 2021-04-25 ENCOUNTER — Telehealth: Payer: Self-pay | Admitting: Internal Medicine

## 2021-04-25 NOTE — Telephone Encounter (Signed)
Pt is a scheduled for surgery on July 18; discussed with Dr. Bary Castilla.  A- please schedule follow-up-first week of August. MD; no labs-in [place].  Thanks GB

## 2021-04-25 NOTE — Telephone Encounter (Signed)
Colette - please schedule apts

## 2021-04-27 ENCOUNTER — Other Ambulatory Visit: Payer: Self-pay | Admitting: General Surgery

## 2021-04-27 DIAGNOSIS — C50211 Malignant neoplasm of upper-inner quadrant of right female breast: Secondary | ICD-10-CM

## 2021-04-27 DIAGNOSIS — Z170 Estrogen receptor positive status [ER+]: Secondary | ICD-10-CM

## 2021-04-27 NOTE — Progress Notes (Signed)
Subjective:     Patient ID: Meghan Harper is a 65 y.o. female.   HPI   The following portions of the patient's history were reviewed and updated as appropriate.   This an established patient is here today for: office visit. The patient has been referred today by Al Pimple, RN for evaluation of a newly diagnosed right breast cancer. The patient reports that she did not feel a lump prior to her recent mammogram. She states she has yearly mammogram checks. Patient does have a history of 3 prior fibroadenomas since age 50.    The patient has no history of breast trauma.  She does report a niece, her brothers daughter had breast cancer and her 37s and by report has a BRCA mutation.   Her brother had CLL.   A maternal and paternal aunt both had breast cancer.   Maternal uncle with melanoma.   Outside of her previous fibroadenoma excisions in the past she has had no breast problems.   The patient works for [employer].   Patient states her bra size is a 38DDD.   The patient is accompanied today by her boyfriend, [name].          Chief Complaint  Patient presents with   Treatment Plan Discussion      Newly diagnosed right breast cancer       BP (!) 162/88   Pulse 80   Temp 36.6 C (97.8 F)   Ht 161.3 cm (5' 3.5")   Wt 85.7 kg (189 lb)   SpO2 97%   BMI 32.95 kg/m        Past Medical History:  Diagnosis Date   Abnormal cytology 06/1995   Allergy many years   Arthritis 02/2016   History of abnormal cervical Pap smear 06/1995   Hyperlipidemia 2007   Melanoma (CMS-HCC) 2011   Moderate single current episode of major depressive disorder (CMS-HCC)             Past Surgical History:  Procedure Laterality Date   BREAST EXCISIONAL BIOPSY   04/06/2021   BREAST EXCISIONAL BIOPSY Left 1978   BREAST EXCISIONAL BIOPSY Right 1987   BREAST EXCISIONAL BIOPSY Right 2013   COLONOSCOPY   12/26/2001    Adenomatous Polyp, [facility] (Mother)   COLONOSCOPY   08/04/2010, 08/17/2005     PH Adenomatous Polyp, [facility] (Mother): CBF 07/2015; Recall Ltr mailed 06/08/2015 (dw)   COLONOSCOPY   09/26/2015    PH Adenomatous Polyp, [facility] (Mother): CBF 09/2020   COLONOSCOPY   09/26/2015   Endoscopic left carpal tunnel release  Left 11/06/2017    Dr.Poggi    endoscopic right carpal tunnel release  Right 08/28/2017    Dr.Poggi    TOOTH EXTRACTION Right 07/12/2018                OB History     Gravida  0   Para  0   Term  0   Preterm  0   AB  0   Living  0      SAB  0   IAB  0   Ectopic  0   Molar  0   Multiple  0   Live Births  0        Obstetric Comments  Age at first period 78               Social History           Socioeconomic History  Marital status: Single  Tobacco Use   Smoking status: Never Smoker   Smokeless tobacco: Never Used  Vaping Use   Vaping Use: Never used  Substance and Sexual Activity   Alcohol use: Yes      Comment: Occasional   Drug use: Never   Sexual activity: Yes      Partners: Male      Birth control/protection: Post-menopausal            Allergies  Allergen Reactions   Mobic [Meloxicam] Itching      Current Medications        Current Outpatient Medications  Medication Sig Dispense Refill   calcium carb/vit D3/minerals (CALTRATE 600+D PLUS MINERALS ORAL) Take by mouth once daily       cranberry extract/vitamin C (AZO CRANBERRY PLUS VIT C ORAL)         fexofenadine (ALLEGRA) 60 MG tablet Take 60 mg by mouth once daily as needed       folic acid/multivit-min/lutein (CENTRUM SILVER ORAL) Take by mouth once daily       glucosam/chond/hyalu/CF borate (MOVE FREE JOINT HEALTH ORAL) Take by mouth once daily       krill-omega-3-dha-epa-lipids 350-90-24-50 mg Cap Take 1 capsule by mouth once daily       Lactobac no.41/Bifidobact no.7 (PROBIOTIC-10 ORAL) Take by mouth once daily       pamabrom 50 mg Cap Take 1 capsule by mouth once daily as needed       rosuvastatin (CRESTOR) 5 MG tablet TAKE ONE TABLET BY  MOUTH DAILY 90 tablet 3   TURMERIC ORAL Take by mouth once daily       ubidecarenone/vitamin E mixed (COQ10 SG 100 ORAL) Take 100 mg by mouth once daily       cetirizine (ZYRTEC) 10 MG tablet Take 10 mg by mouth once daily       ipratropium (ATROVENT) 0.06 % nasal spray 1 spray as needed.           No current facility-administered medications for this visit.             Family History  Problem Relation Age of Onset   Alzheimer's disease Mother     Colon cancer Mother     Hyperlipidemia (Elevated cholesterol) Mother     Osteoporosis (Thinning of bones) Mother     Allergic rhinitis Mother     Myocardial Infarction (Heart attack) Father     Alzheimer's disease Father     Heart failure Father     Stroke Maternal Aunt     Breast cancer Maternal Aunt 60   Skin cancer Maternal Uncle     Breast cancer Paternal Aunt 4          Review of Systems  Constitutional: Negative for chills and fever.  Respiratory: Negative for cough.          Objective:   Physical Exam Exam conducted with a chaperone present.  Constitutional:      Appearance: Normal appearance.  Cardiovascular:     Rate and Rhythm: Normal rate and regular rhythm.     Pulses: Normal pulses.     Heart sounds: Normal heart sounds.  Pulmonary:     Effort: Pulmonary effort is normal.     Breath sounds: Normal breath sounds.  Chest:  Breasts:     Right: Inverted nipple (baseline per patient report) present. No axillary adenopathy or supraclavicular adenopathy.     Left: Normal. No axillary adenopathy or supraclavicular adenopathy.  Comments: The patient has 2 scars on the right breast, 1 on the left from previous biopsies.  No evidence of upper extremity asymmetry. Musculoskeletal:     Cervical back: Neck supple.  Lymphadenopathy:     Upper Body:     Right upper body: No supraclavicular or axillary adenopathy.     Left upper body: No supraclavicular or axillary adenopathy.  Skin:    General: Skin is  warm and dry.  Neurological:     Mental Status: She is alert and oriented to person, place, and time.  Psychiatric:        Mood and Affect: Mood normal.        Behavior: Behavior normal.        Labs and Radiology:    April 06, 2021 pathology:   . BREAST, RIGHT, 1:30 O'CLOCK 8 CM FROM NIPPLE; ULTRASOUND-GUIDED CORE  BIOPSY:  - INVASIVE MAMMARY CARCINOMA, NO SPECIAL TYPE.   Size of invasive carcinoma: 7 mm in this sample  Histologic grade of invasive carcinoma: Grade 2                       Glandular/tubular differentiation score: 3                       Nuclear pleomorphism score: 2                       Mitotic rate score: 1                       Total score: 6  Ductal carcinoma in situ: Present, intermediate grade  Lymphovascular invasion: Not identified   ER/PR/HER2: Immunohistochemistry will be performed on block A1, with  reflex to [facility] for HER2 2+. The results will be reported in an addendu     Imaging studies from December 04, 2019 through April 06, 2021 were independently reviewed.   New density developing in the upper inner quadrant of the right breast.  Ultrasound identified a 5 x 6 x 10 mm irregular mass in this area.  Biopsy results above.   Ultrasound:   Limited ultrasound of the right breast was undertaken to determine if preoperative localization would be required.  The hypoechoic nodule in the upper inner quadrant of the right breast at the 130 o'clock position, 8 cm from nipple measures 0.5 x 0.6 x 0.8 cm.  The clip is well visualized.  Wire localization will not be required.   February 14, 2021 laboratory:   Hemoglobin A1C 4.2 - 5.6 % 5.7 High    Average Blood Glucose (Calc) mg/dL 117     Glucose 70 - 110 mg/dL 97   Sodium 136 - 145 mmol/L 142   Potassium 3.6 - 5.1 mmol/L 4.3   Chloride 97 - 109 mmol/L 104   Carbon Dioxide (CO2) 22.0 - 32.0 mmol/L 30.4   Urea Nitrogen (BUN) 7 - 25 mg/dL 12   Creatinine 0.6 - 1.1 mg/dL 0.7   Glomerular Filtration Rate  (eGFR), MDRD Estimate >60 mL/min/1.73sq m 85   Calcium 8.7 - 10.3 mg/dL 9.6   AST  8 - 39 U/L 23   ALT  5 - 38 U/L 23   Alk Phos (alkaline Phosphatase) 34 - 104 U/L 50   Albumin 3.5 - 4.8 g/dL 4.6   Bilirubin, Total 0.3 - 1.2 mg/dL 0.5   Protein, Total 6.1 - 7.9 g/dL 6.6   A/G Ratio  1.0 - 5.0 gm/dL 2.3     WBC (White Blood Cell Count) 4.1 - 10.2 10^3/uL 5.4   RBC (Red Blood Cell Count) 4.04 - 5.48 10^6/uL 4.55   Hemoglobin 12.0 - 15.0 gm/dL 13.6   Hematocrit 35.0 - 47.0 % 40.4   MCV (Mean Corpuscular Volume) 80.0 - 100.0 fl 88.8   MCH (Mean Corpuscular Hemoglobin) 27.0 - 31.2 pg 29.9   MCHC (Mean Corpuscular Hemoglobin Concentration) 32.0 - 36.0 gm/dL 33.7   Platelet Count 150 - 450 10^3/uL 227   RDW-CV (Red Cell Distribution Width) 11.6 - 14.8 % 11.6   MPV (Mean Platelet Volume) 9.4 - 12.4 fl 8.9 Low    Neutrophils 1.50 - 7.80 10^3/uL 2.78   Lymphocytes 1.00 - 3.60 10^3/uL 1.93   Monocytes 0.00 - 1.50 10^3/uL 0.42   Eosinophils 0.00 - 0.55 10^3/uL 0.18   Basophils 0.00 - 0.09 10^3/uL 0.06   Neutrophil % 32.0 - 70.0 % 51.7   Lymphocyte % 10.0 - 50.0 % 35.9   Monocyte % 4.0 - 13.0 % 7.8   Eosinophil % 1.0 - 5.0 % 3.3   Basophil% 0.0 - 2.0 % 1.1   Immature Granulocyte % <=0.7 % 0.2       September 26 2015 colonoscopy:   Study completed for history of first-degree relative with colorectal cancer.  Negative.  5-year repeat recommended.  (2021) C.      Assessment:     Stage I carcinoma of the right breast.   Very dense breasts on mammography for age.   Family history of breast cancer with at least 1 member BRCA positive (by report).   History of colon cancer in her mother.  Slightly overdue for 5-year screening.    Plan:     We spent approximately an hour together, the majority of which reviewed options for management of her recently diagnosed cancer.  The radiologist had recommended and I agree that based on her dense breast is not inappropriate to do an MRI.  We had a  long discussion regarding the high false positive rate of MRI for either overestimating or underestimating tumor size and finding additional lesions that may or may not be of clinical significance.  In spite of this I think with her dense breast is appropriate.  Orders have been placed.   She requested a recommendation regarding medical oncology.  This was provided and Dr. Aletha Halim office has been contacted.   Breast conservation and mastectomy were presented as therapeutically equivalent procedures.  Pros and cons of each reviewed.  Either would be likely an outpatient procedure.  Role of radiation therapy for breast conservation patient's was discussed.   Hormone receptor assay is pending at this time.  This would not change her surgical options.   She is a candidate for genetic counseling based on her nieces reported diagnosis of being BRCA positive and having 2 maternal aunts with breast cancer.  This report would not change her options for management of her breast.   Informational brochure and website for her benefit were provided.   She was encouraged to call should she have any questions or concerns.  Opportunity for [employer] referral was discussed.   Follow-up based on her needs.   Patient to call the office with her decision.     This note is partially prepared by Ledell Noss, CMA acting as a scribe in the presence of Dr. Hervey Ard, MD.    The documentation recorded by the scribe accurately reflects the service I  personally performed and the decisions made by me.    Robert Bellow, MD FACS

## 2021-04-28 ENCOUNTER — Telehealth: Payer: Self-pay | Admitting: Licensed Clinical Social Worker

## 2021-04-28 NOTE — Telephone Encounter (Signed)
Revealed negative genetic testing on the BRCAPlus STAT Panel through [facility]. This test looks at 8 genes associated with the highest risks for breast cancer that some women will use to help with a surgical decision. The remainder of her testing is still pending and we will call once it is back.

## 2021-05-02 ENCOUNTER — Other Ambulatory Visit
Admission: RE | Admit: 2021-05-02 | Discharge: 2021-05-02 | Disposition: A | Discharge status: Home / Self Care | Payer: Federal, State, Local not specified - PPO | Source: Ambulatory Visit | Attending: General Surgery | Admitting: General Surgery

## 2021-05-02 ENCOUNTER — Other Ambulatory Visit: Payer: Self-pay

## 2021-05-02 HISTORY — DX: Prediabetes: R73.03

## 2021-05-02 NOTE — Patient Instructions (Signed)
Your procedure is scheduled on: Monday May 08, 2021. Report to Day Surgery inside [facility] 2nd floor (stop by admissions desk first before getting on elevator). To find out your arrival time please call [phone] between 1PM - 3PM on Friday May 05, 2021.  Remember: Instructions that are not followed completely may result in serious medical risk,  up to and including death, or upon the discretion of your surgeon and anesthesiologist your  surgery may need to be rescheduled.     _X__ 1. Do not eat food after midnight the night before your procedure.                 No chewing gum or hard candies. You may drink clear liquids up to 2 hours                 before you are scheduled to arrive for your surgery- DO not drink clear                 liquids within 2 hours of the start of your surgery.                 Clear Liquids include:  water, apple juice without pulp, clear Gatorade, G2 or                  Gatorade Zero (avoid Red/Purple/Blue), Black Coffee or Tea (Do not add                 anything to coffee or tea).  __X__2.  On the morning of surgery brush your teeth with toothpaste and water, you                may rinse your mouth with mouthwash if you wish.  Do not swallow any toothpaste of mouthwash.     _X__ 3.  No Alcohol for 24 hours before or after surgery.   _X__ 4.  Do Not Smoke or use e-cigarettes For 24 Hours Prior to Your Surgery.                 Do not use any chewable tobacco products for at least 6 hours prior to                 Surgery.  _X__  5.  Do not use any recreational drugs (marijuana, cocaine, heroin, ecstasy, MDMA or other)                For at least one week prior to your surgery.  Combination of these drugs with anesthesia                May have life threatening results.   __X__6.  Notify your doctor if there is any change in your medical condition      (cold, fever, infections).     Do not wear jewelry, make-up, hairpins,  clips or nail polish. Do not wear lotions, powders, or perfumes. You may wear deodorant. Do not shave 48 hours prior to surgery. Men may shave face and neck. Do not bring valuables to the hospital.    [facility] is not responsible for any belongings or valuables.  Contacts, dentures or bridgework may not be worn into surgery. Leave your suitcase in the car. After surgery it may be brought to your room. For patients admitted to the hospital, discharge time is determined by your treatment team.   Patients discharged the day of surgery will not be allowed to drive  home.   Make arrangements for someone to be with you for the first 24 hours of your Same Day Discharge.   __X__ Take these medicines the morning of surgery with A SIP OF WATER:    1. None   2.   3.   4.  5.  6.  ____ Fleet Enema (as directed)   __X__ Use CHG Soap (or wipes) as directed  ____ Use Benzoyl Peroxide Gel as instructed  ____ Use inhalers on the day of surgery  ____ Stop metformin 2 days prior to surgery    ____ Take 1/2 of usual insulin dose the night before surgery. No insulin the morning          of surgery.   ____ Call your PCP, cardiologist, or Pulmonologist if taking Coumadin/Plavix/aspirin and ask when to stop before your surgery.   __X__ One Week prior to surgery- Stop Anti-inflammatories such as Ibuprofen, Aleve, Advil, Motrin, meloxicam (MOBIC), diclofenac, etodolac, ketorolac, Toradol, Daypro, piroxicam, Goody's or BC powders. OK TO USE TYLENOL IF NEEDED   __X__ Stop supplements until after surgery.    ____ Bring C-Pap to the hospital.    If you have any questions regarding your pre-procedure instructions,  Please call Pre-admit Testing at [phone].

## 2021-05-08 ENCOUNTER — Ambulatory Visit: Payer: Federal, State, Local not specified - PPO | Admitting: Urgent Care

## 2021-05-08 ENCOUNTER — Encounter: Payer: Self-pay | Admitting: General Surgery

## 2021-05-08 ENCOUNTER — Encounter
Admission: RE | Admit: 2021-05-08 | Discharge: 2021-05-08 | Disposition: A | Discharge status: Home / Self Care | Payer: Federal, State, Local not specified - PPO | Source: Ambulatory Visit | Attending: General Surgery | Admitting: General Surgery

## 2021-05-08 ENCOUNTER — Other Ambulatory Visit: Payer: Self-pay

## 2021-05-08 ENCOUNTER — Encounter
Admission: RE | Disposition: A | Discharge status: Home / Self Care | Payer: Self-pay | Source: Home / Self Care | Attending: General Surgery

## 2021-05-08 ENCOUNTER — Ambulatory Visit
Admission: RE | Admit: 2021-05-08 | Discharge: 2021-05-08 | Disposition: A | Discharge status: Home / Self Care | Payer: Federal, State, Local not specified - PPO | Attending: General Surgery | Admitting: General Surgery

## 2021-05-08 ENCOUNTER — Ambulatory Visit
Admission: RE | Admit: 2021-05-08 | Discharge: 2021-05-08 | Disposition: A | Discharge status: Home / Self Care | Payer: Federal, State, Local not specified - PPO | Source: Home / Self Care | Attending: General Surgery | Admitting: General Surgery

## 2021-05-08 DIAGNOSIS — Z79899 Other long term (current) drug therapy: Secondary | ICD-10-CM | POA: Diagnosis not present

## 2021-05-08 DIAGNOSIS — Z170 Estrogen receptor positive status [ER+]: Secondary | ICD-10-CM | POA: Insufficient documentation

## 2021-05-08 DIAGNOSIS — Z91048 Other nonmedicinal substance allergy status: Secondary | ICD-10-CM | POA: Diagnosis not present

## 2021-05-08 DIAGNOSIS — C50211 Malignant neoplasm of upper-inner quadrant of right female breast: Secondary | ICD-10-CM | POA: Insufficient documentation

## 2021-05-08 DIAGNOSIS — R7303 Prediabetes: Secondary | ICD-10-CM | POA: Diagnosis not present

## 2021-05-08 DIAGNOSIS — Z800 Family history of malignant neoplasm of digestive organs: Secondary | ICD-10-CM | POA: Insufficient documentation

## 2021-05-08 DIAGNOSIS — Z803 Family history of malignant neoplasm of breast: Secondary | ICD-10-CM | POA: Insufficient documentation

## 2021-05-08 DIAGNOSIS — Z888 Allergy status to other drugs, medicaments and biological substances status: Secondary | ICD-10-CM | POA: Insufficient documentation

## 2021-05-08 DIAGNOSIS — Z808 Family history of malignant neoplasm of other organs or systems: Secondary | ICD-10-CM | POA: Insufficient documentation

## 2021-05-08 DIAGNOSIS — N6320 Unspecified lump in the left breast, unspecified quadrant: Secondary | ICD-10-CM

## 2021-05-08 DIAGNOSIS — C50911 Malignant neoplasm of unspecified site of right female breast: Secondary | ICD-10-CM | POA: Diagnosis present

## 2021-05-08 HISTORY — PX: BREAST LUMPECTOMY WITH SENTINEL LYMPH NODE BIOPSY: SHX5597

## 2021-05-08 SURGERY — BREAST LUMPECTOMY WITH SENTINEL LYMPH NODE BX
Anesthesia: General | Laterality: Right

## 2021-05-08 MED ORDER — BUPIVACAINE-EPINEPHRINE (PF) 0.5% -1:200000 IJ SOLN
INTRAMUSCULAR | Status: AC
  Filled 2021-05-08: qty 30

## 2021-05-08 MED ORDER — CHLORHEXIDINE GLUCONATE CLOTH 2 % EX PADS: 6.0000 | MEDICATED_PAD | Freq: Once | CUTANEOUS | Status: DC

## 2021-05-08 MED ORDER — PHENYLEPHRINE HCL (PRESSORS) 10 MG/ML IV SOLN
INTRAVENOUS | Status: DC | PRN
  Administered 2021-05-08: 100 ug via INTRAVENOUS
  Administered 2021-05-08: 50 ug via INTRAVENOUS
  Administered 2021-05-08: 100 ug via INTRAVENOUS

## 2021-05-08 MED ORDER — ORAL CARE MOUTH RINSE: 15.0000 mL | Freq: Once | OROMUCOSAL | Status: AC

## 2021-05-08 MED ORDER — ONDANSETRON HCL 4 MG/2ML IJ SOLN
INTRAMUSCULAR | Status: DC | PRN
Start: 2021-05-08 — End: 2021-05-08
  Administered 2021-05-08: 4 mg via INTRAVENOUS

## 2021-05-08 MED ORDER — MIDAZOLAM HCL 2 MG/2ML IJ SOLN
INTRAMUSCULAR | Status: AC
  Filled 2021-05-08: qty 2

## 2021-05-08 MED ORDER — BUPIVACAINE-EPINEPHRINE (PF) 0.5% -1:200000 IJ SOLN
INTRAMUSCULAR | Status: DC | PRN
  Administered 2021-05-08: 30 mL

## 2021-05-08 MED ORDER — ROCURONIUM BROMIDE 10 MG/ML (PF) SYRINGE
PREFILLED_SYRINGE | INTRAVENOUS | Status: AC
  Filled 2021-05-08: qty 10

## 2021-05-08 MED ORDER — CHLORHEXIDINE GLUCONATE 0.12 % MT SOLN
OROMUCOSAL | Status: AC
  Administered 2021-05-08: 15 mL via OROMUCOSAL
  Filled 2021-05-08: qty 15

## 2021-05-08 MED ORDER — KETOROLAC TROMETHAMINE 30 MG/ML IJ SOLN
INTRAMUSCULAR | Status: AC
  Filled 2021-05-08: qty 2

## 2021-05-08 MED ORDER — FAMOTIDINE 20 MG PO TABS: 20.0000 mg | ORAL_TABLET | Freq: Once | ORAL | Status: AC

## 2021-05-08 MED ORDER — DEXAMETHASONE SODIUM PHOSPHATE 10 MG/ML IJ SOLN
INTRAMUSCULAR | Status: AC
  Filled 2021-05-08: qty 1

## 2021-05-08 MED ORDER — METHYLENE BLUE 0.5 % INJ SOLN
INTRAVENOUS | Status: DC | PRN
  Administered 2021-05-08: 5 mL

## 2021-05-08 MED ORDER — CHLORHEXIDINE GLUCONATE 0.12 % MT SOLN: 15.0000 mL | Freq: Once | OROMUCOSAL | Status: AC

## 2021-05-08 MED ORDER — ACETAMINOPHEN 10 MG/ML IV SOLN
INTRAVENOUS | Status: DC | PRN
  Administered 2021-05-08: 1000 mg via INTRAVENOUS

## 2021-05-08 MED ORDER — METHYLENE BLUE 0.5 % INJ SOLN
INTRAVENOUS | Status: AC
  Filled 2021-05-08: qty 10

## 2021-05-08 MED ORDER — PROPOFOL 500 MG/50ML IV EMUL
INTRAVENOUS | Status: AC
  Filled 2021-05-08: qty 50

## 2021-05-08 MED ORDER — DEXMEDETOMIDINE (PRECEDEX) IN NS 20 MCG/5ML (4 MCG/ML) IV SYRINGE
PREFILLED_SYRINGE | INTRAVENOUS | Status: DC | PRN
  Administered 2021-05-08 (×2): 4 ug via INTRAVENOUS

## 2021-05-08 MED ORDER — FENTANYL CITRATE (PF) 100 MCG/2ML IJ SOLN: 25.0000 ug | INTRAMUSCULAR | Status: DC | PRN

## 2021-05-08 MED ORDER — TECHNETIUM TC 99M TILMANOCEPT KIT
1.0000 | PACK | Freq: Once | INTRAVENOUS | Status: AC | PRN
  Administered 2021-05-08: 1.16 via INTRADERMAL

## 2021-05-08 MED ORDER — LIDOCAINE HCL (PF) 2 % IJ SOLN
INTRAMUSCULAR | Status: AC
  Filled 2021-05-08: qty 10

## 2021-05-08 MED ORDER — FAMOTIDINE 20 MG PO TABS
ORAL_TABLET | ORAL | Status: AC
  Administered 2021-05-08: 20 mg via ORAL
  Filled 2021-05-08: qty 1

## 2021-05-08 MED ORDER — ACETAMINOPHEN 10 MG/ML IV SOLN: 1000.0000 mg | Freq: Once | INTRAVENOUS | Status: DC | PRN

## 2021-05-08 MED ORDER — FENTANYL CITRATE (PF) 100 MCG/2ML IJ SOLN
INTRAMUSCULAR | Status: AC
  Filled 2021-05-08: qty 2

## 2021-05-08 MED ORDER — FENTANYL CITRATE (PF) 100 MCG/2ML IJ SOLN
INTRAMUSCULAR | Status: DC | PRN
  Administered 2021-05-08 (×4): 25 ug via INTRAVENOUS
  Administered 2021-05-08 (×2): 50 ug via INTRAVENOUS

## 2021-05-08 MED ORDER — DEXAMETHASONE SODIUM PHOSPHATE 10 MG/ML IJ SOLN
INTRAMUSCULAR | Status: DC | PRN
  Administered 2021-05-08: 10 mg via INTRAVENOUS

## 2021-05-08 MED ORDER — LACTATED RINGERS IV SOLN: INTRAVENOUS | Status: DC

## 2021-05-08 MED ORDER — ONDANSETRON HCL 4 MG/2ML IJ SOLN
INTRAMUSCULAR | Status: AC
  Filled 2021-05-08: qty 2

## 2021-05-08 MED ORDER — ONDANSETRON HCL 4 MG/2ML IJ SOLN
INTRAMUSCULAR | Status: AC
  Filled 2021-05-08: qty 6

## 2021-05-08 MED ORDER — DEXAMETHASONE SODIUM PHOSPHATE 10 MG/ML IJ SOLN
INTRAMUSCULAR | Status: AC
  Filled 2021-05-08: qty 3

## 2021-05-08 MED ORDER — HYDROCODONE-ACETAMINOPHEN 5-325 MG PO TABS: 1.0000 | ORAL_TABLET | ORAL | 0 refills | Status: DC | PRN

## 2021-05-08 MED ORDER — ONDANSETRON HCL 4 MG/2ML IJ SOLN: 4.0000 mg | Freq: Once | INTRAMUSCULAR | Status: DC | PRN

## 2021-05-08 MED ORDER — OXYCODONE HCL 5 MG PO TABS: 5.0000 mg | ORAL_TABLET | Freq: Once | ORAL | Status: DC | PRN

## 2021-05-08 MED ORDER — MIDAZOLAM HCL 2 MG/2ML IJ SOLN
INTRAMUSCULAR | Status: AC
  Filled 2021-05-08: qty 10

## 2021-05-08 MED ORDER — PROPOFOL 10 MG/ML IV BOLUS
INTRAVENOUS | Status: DC | PRN
  Administered 2021-05-08: 180 mg via INTRAVENOUS

## 2021-05-08 MED ORDER — LIDOCAINE HCL (CARDIAC) PF 100 MG/5ML IV SOSY
PREFILLED_SYRINGE | INTRAVENOUS | Status: DC | PRN
Start: 2021-05-08 — End: 2021-05-08
  Administered 2021-05-08: 100 mg via INTRAVENOUS

## 2021-05-08 MED ORDER — OXYCODONE HCL 5 MG/5ML PO SOLN: 5.0000 mg | Freq: Once | ORAL | Status: DC | PRN

## 2021-05-08 MED ORDER — MIDAZOLAM HCL 2 MG/2ML IJ SOLN
INTRAMUSCULAR | Status: DC | PRN
Start: 2021-05-08 — End: 2021-05-08
  Administered 2021-05-08: 2 mg via INTRAVENOUS

## 2021-05-08 MED ORDER — KETOROLAC TROMETHAMINE 30 MG/ML IJ SOLN
INTRAMUSCULAR | Status: DC | PRN
Start: 2021-05-08 — End: 2021-05-08
  Administered 2021-05-08: 30 mg via INTRAVENOUS

## 2021-05-08 MED ORDER — LIDOCAINE HCL (PF) 2 % IJ SOLN
INTRAMUSCULAR | Status: AC
  Filled 2021-05-08: qty 5

## 2021-05-08 MED ORDER — EPHEDRINE SULFATE 50 MG/ML IJ SOLN
INTRAMUSCULAR | Status: DC | PRN
  Administered 2021-05-08: 10 mg via INTRAVENOUS
  Administered 2021-05-08: 5 mg via INTRAVENOUS
  Administered 2021-05-08: 10 mg via INTRAVENOUS
  Administered 2021-05-08: 5 mg via INTRAVENOUS

## 2021-05-08 MED ORDER — ACETAMINOPHEN 10 MG/ML IV SOLN
INTRAVENOUS | Status: AC
  Filled 2021-05-08: qty 100

## 2021-05-08 MED ORDER — STERILE WATER FOR IRRIGATION IR SOLN
Status: DC | PRN
  Administered 2021-05-08: 1000 mL

## 2021-05-08 SURGICAL SUPPLY — 60 items
ADH SKN CLS APL DERMABOND .7 (GAUZE/BANDAGES/DRESSINGS) ×1
APL PRP STRL LF DISP 70% ISPRP (MISCELLANEOUS) ×1
BINDER BREAST XLRG (GAUZE/BANDAGES/DRESSINGS) ×2 IMPLANT
BLADE BOVIE TIP EXT 4 (BLADE) IMPLANT
BLADE SURG 15 STRL SS SAFETY (BLADE) ×4 IMPLANT
BULB RESERV EVAC DRAIN JP 100C (MISCELLANEOUS) IMPLANT
CANISTER SUCT 1200ML W/VALVE (MISCELLANEOUS) ×2 IMPLANT
CHLORAPREP W/TINT 26 (MISCELLANEOUS) ×2 IMPLANT
CNTNR SPEC 2.5X3XGRAD LEK (MISCELLANEOUS)
CONT SPEC 4OZ STER OR WHT (MISCELLANEOUS)
CONT SPEC 4OZ STRL OR WHT (MISCELLANEOUS)
CONTAINER SPEC 2.5X3XGRAD LEK (MISCELLANEOUS) IMPLANT
COVER PROBE FLX POLY STRL (MISCELLANEOUS) ×2 IMPLANT
COVER TRANSDUCER ULTRASOUND (MISCELLANEOUS) ×2 IMPLANT
DERMABOND ADVANCED (GAUZE/BANDAGES/DRESSINGS) ×1
DERMABOND ADVANCED .7 DNX12 (GAUZE/BANDAGES/DRESSINGS) ×1 IMPLANT
DEVICE DUBIN SPECIMEN MAMMOGRA (MISCELLANEOUS) ×2 IMPLANT
DRAIN CHANNEL JP 15F RND 16 (MISCELLANEOUS) IMPLANT
DRAPE LAPAROTOMY TRNSV 106X77 (MISCELLANEOUS) ×2 IMPLANT
DRSG GAUZE FLUFF 36X18 (GAUZE/BANDAGES/DRESSINGS) ×4 IMPLANT
DRSG TELFA 3X8 NADH (GAUZE/BANDAGES/DRESSINGS) ×2 IMPLANT
ELECT CAUTERY BLADE TIP 2.5 (TIP) ×2
ELECT REM PT RETURN 9FT ADLT (ELECTROSURGICAL) ×2
ELECTRODE CAUTERY BLDE TIP 2.5 (TIP) ×1 IMPLANT
ELECTRODE REM PT RTRN 9FT ADLT (ELECTROSURGICAL) ×1 IMPLANT
GAUZE 4X4 16PLY ~~LOC~~+RFID DBL (SPONGE) ×2 IMPLANT
GLOVE SURG ENC MOIS LTX SZ7.5 (GLOVE) ×2 IMPLANT
GLOVE SURG UNDER LTX SZ8 (GLOVE) ×2 IMPLANT
GOWN STRL REUS W/ TWL LRG LVL3 (GOWN DISPOSABLE) ×2 IMPLANT
GOWN STRL REUS W/TWL LRG LVL3 (GOWN DISPOSABLE) ×4
KIT TURNOVER KIT A (KITS) ×2 IMPLANT
LABEL OR SOLS (LABEL) ×2 IMPLANT
MANIFOLD NEPTUNE II (INSTRUMENTS) ×2 IMPLANT
MARGIN MAP 10MM (MISCELLANEOUS) ×2 IMPLANT
NEEDLE HYPO 22GX1.5 SAFETY (NEEDLE) ×2 IMPLANT
NEEDLE HYPO 25X1 1.5 SAFETY (NEEDLE) ×4 IMPLANT
NEEDLE SPNL 20GX3.5 QUINCKE YW (NEEDLE) IMPLANT
PACK BASIN MINOR ARMC (MISCELLANEOUS) ×2 IMPLANT
RETRACTOR RING XSMALL (MISCELLANEOUS) ×1 IMPLANT
RTRCTR WOUND ALEXIS 13CM XS SH (MISCELLANEOUS) ×2
SHEARS FOC LG CVD HARMONIC 17C (MISCELLANEOUS) IMPLANT
SHEARS HARMONIC 9CM CVD (BLADE) IMPLANT
SLEVE PROBE SENORX GAMMA FIND (MISCELLANEOUS) IMPLANT
STRIP CLOSURE SKIN 1/2X4 (GAUZE/BANDAGES/DRESSINGS) ×2 IMPLANT
SUT ETHILON 3-0 FS-10 30 BLK (SUTURE) ×2
SUT SILK 2 0 (SUTURE) ×2
SUT SILK 2-0 18XBRD TIE 12 (SUTURE) ×1 IMPLANT
SUT VIC AB 2-0 CT1 (SUTURE) ×2 IMPLANT
SUT VIC AB 2-0 CT1 27 (SUTURE) ×4
SUT VIC AB 2-0 CT1 TAPERPNT 27 (SUTURE) ×2 IMPLANT
SUT VIC AB 3-0 SH 27 (SUTURE) ×4
SUT VIC AB 3-0 SH 27X BRD (SUTURE) ×2 IMPLANT
SUT VIC AB 4-0 FS2 27 (SUTURE) ×2 IMPLANT
SUT VICRYL+ 3-0 144IN (SUTURE) ×2 IMPLANT
SUTURE EHLN 3-0 FS-10 30 BLK (SUTURE) ×1 IMPLANT
SWABSTK COMLB BENZOIN TINCTURE (MISCELLANEOUS) ×2 IMPLANT
SYR 10ML LL (SYRINGE) ×2 IMPLANT
SYR BULB IRRIG 60ML STRL (SYRINGE) ×2 IMPLANT
TAPE TRANSPORE STRL 2 31045 (GAUZE/BANDAGES/DRESSINGS) ×2 IMPLANT
WATER STERILE IRR 1000ML POUR (IV SOLUTION) ×2 IMPLANT

## 2021-05-08 NOTE — Op Note (Signed)
Preoperative diagnosis: Invasive mammary carcinoma of the right breast, upper inner quadrant.  Postoperative diagnosis: Same.  Operative procedure: Ultrasound-guided wide excision of the right breast cancer, sentinel node biopsy x4, tissue transfer.  Operating Surgeon: Hervey Ard, MD.  Anesthesia: General by LMA, Marcaine 0.5% with 1: 200,000 units of epinephrine, 30 cc (pectoralis block).  Estimated blood loss: 5 cc.  Clinical note: This 65 year old woman was recently notified with a T1b carcinoma of the right breast.  She desired breast conservation.  She underwent injection with technetium sulfur colloid the morning of the procedure and tolerated this well.  SCD stockings were used for DVT prevention.  Antibiotics were not indicated.  Operative note: The patient underwent general anesthesia and tolerated this well.  5 cc of 0.5% methylene blue was instilled in the subareolar plexus.  The breast was then cleansed with ChloraPrep and draped.  Ultrasound was used to perform a pectoralis block with the above-mentioned local anesthetic.  The tumor site in the upper inner quadrant was identified with ultrasound (image captured for permanent record) and the area for resection blocked out.  Local anesthesia was infiltrated for postoperative analgesia.  A curvilinear incision in the upper outer quadrant was made.  The skin was incised sharply and the adipose tissue divided with electrocautery.  An extra small Alexis wound protector was placed and a block of tissue measuring 2-1/2 x 3 x 3 cm including the pectoralis fascia was excised, orientated and specimen radiograph confirmed the previously placed clip.  A dampened sponge was placed in the wound and attention was turned towards the axilla.  The node seeker device was utilized in the area of increased uptake in the axilla identified.  A transverse incision was made here and carried down through the skin and subcutaneous tissue.  Hemostasis was  electrocautery.  The axillary envelope was opened and eventually 4 nodes were identified with counts of 14,000, 9000, 8000 and 1000.  These were sent in formalin for routine histology.  Hemostasis was electrocautery.  The axillary envelope was closed with a 2-0 Vicryl figure-of-eight suture.  The adipose layer was closed in similar fashion.  The skin was closed with a running 4-0 Vicryl subcuticular suture.  The breast tissue was elevated circumferentially off the pectoralis muscle with electrocautery.  This allowed easy approximation of this 10 cm defect.  This was completed with 2-0 Vicryl figure-of-eight sutures.  Layered closure of the adipose tissue and breast parenchyma was completed with similar sutures.  The subcutaneous fat was closed with a running 2-0 Vicryl suture.  The skin closed with a running 4-0 Vicryl subcuticular suture.  The most medial aspect of the incision gapes slightly and it was necessary to use 1 Steri-Strip in this area.  The remaining wounds were covered with Dermabond.  Telfa, fluff gauze and a compressive wrap were applied.  Patient tolerated the procedure well and was taken to recovery in stable condition.

## 2021-05-08 NOTE — Anesthesia Preprocedure Evaluation (Signed)
Anesthesia Evaluation  Patient identified by MRN, date of birth, ID band Patient awake    Reviewed: Allergy & Precautions, NPO status , Patient's Chart, lab work & pertinent test results  History of Anesthesia Complications Negative for: history of anesthetic complications  Airway Mallampati: II  TM Distance: >3 FB Neck ROM: Full    Dental no notable dental hx. (+) Teeth Intact   Pulmonary neg pulmonary ROS, neg sleep apnea, neg COPD, Patient abstained from smoking.Not current smoker,  Snoring    Pulmonary exam normal breath sounds clear to auscultation       Cardiovascular Exercise Tolerance: Good METS(-) hypertension(-) CAD and (-) Past MI negative cardio ROS Normal cardiovascular exam(-) dysrhythmias  Rhythm:Regular Rate:Normal - Systolic murmurs    Neuro/Psych negative neurological ROS  negative psych ROS   GI/Hepatic negative GI ROS, neg GERD  ,(+)     (-) substance abuse  ,   Endo/Other  negative endocrine ROSneg diabetes  Renal/GU negative Renal ROS     Musculoskeletal  (+) Arthritis , Osteoarthritis,    Abdominal   Peds  Hematology negative hematology ROS (+)   Anesthesia Other Findings Past Medical History: No date: Arthritis No date: Family history of breast cancer No date: Family history of CLL (chronic lymphoid leukemia) No date: Family history of colon cancer No date: Family history of melanoma No date: Family history of pancreatic cancer 04/14/2010: 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 Excised:               04/21/2010 No date: Pre-diabetes No date: Wears contact lenses  Reproductive/Obstetrics                             Anesthesia Physical  Anesthesia Plan  ASA: 2  Anesthesia Plan: General   Post-op Pain Management:    Induction: Intravenous  PONV Risk Score and Plan: 3 and Ondansetron, Dexamethasone and  Midazolam  Airway Management Planned: LMA  Additional Equipment: None  Intra-op Plan:   Post-operative Plan: Extubation in OR  Informed Consent: I have reviewed the patients History and Physical, chart, labs and discussed the procedure including the risks, benefits and alternatives for the proposed anesthesia with the patient or authorized representative who has indicated his/her understanding and acceptance.     Dental advisory given  Plan Discussed with: CRNA  Anesthesia Plan Comments: (Discussed risks of anesthesia with patient, including PONV, sore throat, lip/dental damage. Rare risks discussed as well, such as cardiorespiratory and neurological sequelae. Patient understands.)        Anesthesia Quick Evaluation

## 2021-05-08 NOTE — Transfer of Care (Signed)
Immediate Anesthesia Transfer of Care Note  Patient: Meghan Harper  Procedure(s) Performed: BREAST LUMPECTOMY WITH SENTINEL LYMPH NODE BX (Right)  Patient Location: PACU  Anesthesia Type:General  Level of Consciousness: drowsy  Airway & Oxygen Therapy: Patient Spontanous Breathing and Patient connected to face mask oxygen  Post-op Assessment: Report given to RN and Post -op Vital signs reviewed and stable  Post vital signs: Reviewed and stable  Last Vitals:  Vitals Value Taken Time  BP 117/64 05/08/21 1533  Temp 36.6 C 05/08/21 1533  Pulse 82 05/08/21 1536  Resp 18 05/08/21 1536  SpO2 100 % 05/08/21 1536  Vitals shown include unvalidated device data.  Last Pain:  Vitals:   05/08/21 1240  TempSrc: Temporal  PainSc: 0-No pain         Complications: No notable events documented.

## 2021-05-08 NOTE — Discharge Instructions (Addendum)
Urine will be green for the next 24 hours.AMBULATORY SURGERY  DISCHARGE INSTRUCTIONS   The drugs that you were given will stay in your system until tomorrow so for the next 24 hours you should not:  Drive an automobile Make any legal decisions Drink any alcoholic beverage   You may resume regular meals tomorrow.  Today it is better to start with liquids and gradually work up to solid foods.  You may eat anything you prefer, but it is better to start with liquids, then soup and crackers, and gradually work up to solid foods.   Please notify your doctor immediately if you have any unusual bleeding, trouble breathing, redness and pain at the surgery site, drainage, fever, or pain not relieved by medication.    Your post-operative visit with Dr.                                       is: Date:                        Time:    Please call to schedule your post-operative visit.  Additional Instructions:

## 2021-05-08 NOTE — Anesthesia Procedure Notes (Signed)
Procedure Name: LMA Insertion Date/Time: 05/08/2021 1:45 PM Performed by: Demetrius Charity, CRNA Pre-anesthesia Checklist: Patient identified, Patient being monitored, Timeout performed, Emergency Drugs available and Suction available Patient Re-evaluated:Patient Re-evaluated prior to induction Oxygen Delivery Method: Circle system utilized Preoxygenation: Pre-oxygenation with 100% oxygen Induction Type: IV induction Ventilation: Mask ventilation without difficulty LMA: LMA inserted LMA Size: 4.0 Tube type: Oral Number of attempts: 1 Placement Confirmation: positive ETCO2 and breath sounds checked- equal and bilateral Tube secured with: Tape Dental Injury: Teeth and Oropharynx as per pre-operative assessment

## 2021-05-08 NOTE — H&P (Signed)
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  The patient desires to proceed to breast conservation surgery.  She tolerated injection with technetium this morning without difficulty.  Medications Prior to Admission  Medication Sig Dispense Refill Last Dose   Calcium Carb-Cholecalciferol (CALCIUM 600 + D PO) Take 1 tablet by mouth 2 (two) times daily.   05/02/2021   CO-ENZYME Q-10 PO Take 100 mg by mouth daily.   05/02/2021   fexofenadine (ALLEGRA) 60 MG tablet Take 60 mg by mouth daily.   05/02/2021   Glucosamine-Chondroitin (MOVE FREE PO) Take 1 tablet by mouth daily.   05/02/2021   ibuprofen (ADVIL) 200 MG tablet Take 400 mg by mouth every 6 (six) hours as needed for mild pain.   Past Week   Krill Oil 350 MG CAPS Take 350 mg by mouth daily.   05/02/2021   Multiple Vitamin (MULTIVITAMIN) capsule Take 1 capsule by mouth daily. Centrum Silver   05/02/2021   Pamabrom 50 MG CAPS Take 50 mg by mouth daily as needed (water gain).   Past Month   Phenylephrine-Acetaminophen (TYLENOL SINUS+HEADACHE) 5-325 MG TABS Take 1 tablet by mouth daily as needed (Headache).   Past Week   Polyvinyl Alcohol-Povidone (CLEAR EYES ALL SEASONS) 5-6 MG/ML SOLN Place 1 drop into both eyes daily as needed (Dry eyes).   05/08/2021 at 1030   Probiotic Product (PROBIOTIC-10 PO) Take 1 tablet by mouth daily.   05/02/2021   rosuvastatin (CRESTOR) 5 MG tablet Take 5 mg by mouth daily.   05/07/2021   Turmeric 500 MG CAPS Take 500 mg by mouth daily.   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 Excised: 04/21/2010   Pre-diabetes    Wears contact lenses    Past Surgical History:  Procedure Laterality Date   BREAST BIOPSY Right 1987, 2013   fibroadenoma x 2   BREAST BIOPSY Right 04/06/2021   u/s bx-"venus" clip-path pendning   BREAST EXCISIONAL BIOPSY Left 1978   BREAST FIBROADENOMA SURGERY     CARPAL TUNNEL RELEASE Right 08/28/2017   Procedure: CARPAL TUNNEL RELEASE ENDOSCOPIC;  Surgeon: Corky Mull, MD;  Location: [facility];  Service: Orthopedics;  Laterality: Right;   CARPAL TUNNEL RELEASE Left 11/06/2017   Procedure: CARPAL TUNNEL RELEASE ENDOSCOPIC;  Surgeon: Corky Mull, MD;  Location: [facility];  Service: Orthopedics;  Laterality: Left;   COLONOSCOPY N/A 09/26/2015   Procedure: COLONOSCOPY;  Surgeon: Manya Silvas, MD;  Location: [facility] ENDOSCOPY;  Service: Endoscopy;  Laterality: N/A;   TOOTH EXTRACTION     Social History   Socioeconomic History   Marital status: Single    Spouse name: Not on file   Number of children: Not on file   Years of education: Not on file   Highest education level: Not on file  Occupational History   Not on  file  Tobacco Use   Smoking status: Never   Smokeless tobacco: Never  Vaping Use   Vaping Use: Never used  Substance and Sexual Activity   Alcohol use: Yes    Alcohol/week: 0.0 standard drinks    Comment: may have a drink 3-4x/yr   Drug use: No   Sexual activity: Not on file  Other Topics Concern   Not on file  Social History Narrative   Not on file   Social Determinants of Health   Financial Resource Strain: Not on file  Food Insecurity: Not on file  Transportation Needs: Not on file  Physical Activity: Not on file  Stress: Not on file  Social Connections: Not on file  Intimate Partner Violence: Not on file   Social History   Social History Narrative   Not on file      ROS: Negative.     PE: HEENT: Negative. Lungs: Clear. Cardio: RR.  Assessment/Plan:  Proceed with planned right breast cancer excision and SLN biopsy.  Forest Gleason [facility] 05/08/2021

## 2021-05-09 ENCOUNTER — Encounter: Payer: Self-pay | Admitting: General Surgery

## 2021-05-09 ENCOUNTER — Telehealth: Payer: Self-pay | Admitting: Licensed Clinical Social Worker

## 2021-05-09 ENCOUNTER — Encounter: Payer: Self-pay | Admitting: Licensed Clinical Social Worker

## 2021-05-09 ENCOUNTER — Ambulatory Visit: Payer: Self-pay | Admitting: Licensed Clinical Social Worker

## 2021-05-09 DIAGNOSIS — Z1379 Encounter for other screening for genetic and chromosomal anomalies: Secondary | ICD-10-CM

## 2021-05-09 DIAGNOSIS — Z806 Family history of leukemia: Secondary | ICD-10-CM

## 2021-05-09 DIAGNOSIS — Z803 Family history of malignant neoplasm of breast: Secondary | ICD-10-CM

## 2021-05-09 DIAGNOSIS — Z170 Estrogen receptor positive status [ER+]: Secondary | ICD-10-CM

## 2021-05-09 DIAGNOSIS — Z808 Family history of malignant neoplasm of other organs or systems: Secondary | ICD-10-CM

## 2021-05-09 DIAGNOSIS — C50211 Malignant neoplasm of upper-inner quadrant of right female breast: Secondary | ICD-10-CM

## 2021-05-09 DIAGNOSIS — Z800 Family history of malignant neoplasm of digestive organs: Secondary | ICD-10-CM

## 2021-05-09 NOTE — Anesthesia Postprocedure Evaluation (Signed)
Anesthesia Post Note  Patient: Meghan Harper  Procedure(s) Performed: BREAST LUMPECTOMY WITH SENTINEL LYMPH NODE BX (Right)  Patient location during evaluation: PACU Anesthesia Type: General Level of consciousness: awake and alert Pain management: pain level controlled Vital Signs Assessment: post-procedure vital signs reviewed and stable Respiratory status: spontaneous breathing, nonlabored ventilation, respiratory function stable and patient connected to nasal cannula oxygen Cardiovascular status: blood pressure returned to baseline and stable Postop Assessment: no apparent nausea or vomiting Anesthetic complications: no   No notable events documented.   Last Vitals:  Vitals:   05/08/21 1630 05/08/21 1642  BP: 125/67 128/66  Pulse: 86 89  Resp: 18 18  Temp: (!) 36.3 C (!) 36.3 C  SpO2: 94% 96%    Last Pain:  Vitals:   05/08/21 1642  TempSrc: Temporal  PainSc: 0-No pain                 Arita Miss

## 2021-05-09 NOTE — Telephone Encounter (Signed)
Revealed negative genetic testing on the remainder of the panel.  This normal result is reassuring and indicates that it is unlikely Meghan Harper's cancer is due to a hereditary cause.  It is unlikely that there is an increased risk of another cancer due to a mutation in one of these genes.  However, genetic testing is not perfect, and cannot definitively rule out a hereditary cause.  It will be important for her to keep in contact with genetics to learn if any additional testing may be needed in the future.

## 2021-05-09 NOTE — Progress Notes (Signed)
HPI:  Ms. Esteban was previously seen in the [place] clinic due to a personal and family history of cancer and concerns regarding a hereditary predisposition to cancer. Please refer to our prior cancer genetics clinic note for more information regarding our discussion, assessment and recommendations, at the time. Ms. Stopher recent genetic test results were disclosed to her, as were recommendations warranted by these results. These results and recommendations are discussed in more detail below.  CANCER HISTORY:  Oncology History Overview Note  # June 2022- Targeted ultrasound was performed of the RIGHT upper inner breast. At 1:30 8 cm from the nipple, there is an irregular hypoechoic mass with indistinct margins. It measures approximately 10 x 5 x 6 mm. This corresponds to the site of screening mammographic concern.   Targeted ultrasound was performed of the RIGHT axilla. No suspicious axillary lymph nodes are seen.   IMPRESSION: 1. There is a 10 mm mass in the RIGHT upper inner breast which is concerning for malignancy. Recommend ultrasound-guided biopsy for definitive characterization.  DIAGNOSIS:  A. BREAST, RIGHT, 1:30 O'CLOCK 8 CM FROM NIPPLE; ULTRASOUND-GUIDED CORE  BIOPSY:  - INVASIVE MAMMARY CARCINOMA, NO SPECIAL TYPE.   Size of invasive carcinoma: 7 mm in this sample  Histologic grade of invasive carcinoma: Grade 2                       Glandular/tubular differentiation score: 3                       Nuclear pleomorphism score: 2                       Mitotic rate score: 1                       Total score: 6  Ductal carcinoma in situ: Present, intermediate grade  Lymphovascular invasion: Not identified   CASE SUMMARY: BREAST BIOMARKER TESTS  Estrogen Receptor (ER) Status: POSITIVE          Percentage of cells with nuclear positivity: Greater than 90%          Average intensity of staining: Strong   Progesterone Receptor (PgR) Status: NEGATIVE (less  than 1%)          Internal control cells present and stain as expected   HER2 (by immunohistochemistry): NEGATIVE (Score 0)   Carcinoma of upper-inner quadrant of right breast in female, estrogen receptor positive ([facility])  04/18/2021 Initial Diagnosis   Carcinoma of upper-inner quadrant of right breast in female, estrogen receptor positive ([facility])    Genetic Testing   Negative genetic testing. No pathogenic variants identified on the [facility] CancerNext-Expanded+RNA Panel. The report date is 04/30/2021.   The CancerNext-Expanded + RNAinsight gene panel offered by [employer] and includes sequencing and rearrangement analysis for the following 77 genes: IP, ALK, APC*, ATM*, AXIN2, BAP1, BARD1, BLM, BMPR1A, BRCA1*, BRCA2*, BRIP1*, CDC73, CDH1*,CDK4, CDKN1B, CDKN2A, CHEK2*, CTNNA1, DICER1, FANCC, FH, FLCN, GALNT12, KIF1B, LZTR1, MAX, MEN1, MET, MLH1*, MSH2*, MSH3, MSH6*, MUTYH*, NBN, NF1*, NF2, NTHL1, PALB2*, PHOX2B, PMS2*, POT1, PRKAR1A, PTCH1, PTEN*, RAD51C*, RAD51D*,RB1, RECQL, RET, SDHA, SDHAF2, SDHB, SDHC, SDHD, SMAD4, SMARCA4, SMARCB1, SMARCE1, STK11, SUFU, TMEM127, TP53*,TSC1, TSC2, VHL and XRCC2 (sequencing and deletion/duplication); EGFR, EGLN1, HOXB13, KIT, MITF, PDGFRA, POLD1 and POLE (sequencing only); EPCAM and GREM1 (deletion/duplication only).     FAMILY HISTORY:  We obtained a detailed, 4-generation family history.  Significant  diagnoses are listed below: Family History  Problem Relation Age of Onset   Colon cancer Mother        dx 17s   Other Brother        CLL and skin cancers   Breast cancer Maternal Aunt 60   Lung cancer Maternal Aunt    Breast cancer Paternal Aunt 72   Pancreatic cancer Paternal Aunt    Melanoma Maternal Uncle    Breast cancer Niece        dx 66s, "BRCA pos"   Ms. Doubrava does not have children. She has 3  brothers. One brother has CLL and has had skin cancers, he worked in [employer]. His daughter had breast cancer in her 47s and reportedly has a BRCA  mutation. Patient is unsure if she could get a copy of this report.   Ms. Chick mother had colon cancer in her 70s and died at 32. Patient had 3 maternal aunts, 1 uncle. One aunt had breast cancer postmenopause, then 10 years later had lung cancer and died at 52. Her uncle had melanoma and died in his 27s.Grandmother died at 62, grandfather died at 23.   Ms. Faulks father died at 43. Patient had 2 paternal aunts. One had breast cancer postmenopause and then pancreatic cancer 10 years later, she died at  52. Maternal grandmother died at 61, grandfather died early.   Ms. Fogleman is aware of previous family history of genetic testing for hereditary cancer risks. Patient's maternal ancestors are of English descent, and paternal ancestors are of Scottish/Irish descent. There is no reported Ashkenazi Jewish ancestry. There is no known consanguinity.       GENETIC TEST RESULTS: Genetic testing reported out on 04/30/2021 through the [facility] CancerNext-Expanded+RNA cancer panel found no pathogenic mutations.   The CancerNext-Expanded + RNAinsight gene panel offered by [employer] and includes sequencing and rearrangement analysis for the following 77 genes: IP, ALK, APC*, ATM*, AXIN2, BAP1, BARD1, BLM, BMPR1A, BRCA1*, BRCA2*, BRIP1*, CDC73, CDH1*,CDK4, CDKN1B, CDKN2A, CHEK2*, CTNNA1, DICER1, FANCC, FH, FLCN, GALNT12, KIF1B, LZTR1, MAX, MEN1, MET, MLH1*, MSH2*, MSH3, MSH6*, MUTYH*, NBN, NF1*, NF2, NTHL1, PALB2*, PHOX2B, PMS2*, POT1, PRKAR1A, PTCH1, PTEN*, RAD51C*, RAD51D*,RB1, RECQL, RET, SDHA, SDHAF2, SDHB, SDHC, SDHD, SMAD4, SMARCA4, SMARCB1, SMARCE1, STK11, SUFU, TMEM127, TP53*,TSC1, TSC2, VHL and XRCC2 (sequencing and deletion/duplication); EGFR, EGLN1, HOXB13, KIT, MITF, PDGFRA, POLD1 and POLE (sequencing only); EPCAM and GREM1 (deletion/duplication only).   The test report has been scanned into EPIC and is located under the Molecular Pathology section of the Results Review tab.  A portion of the  result report is included below for reference.     We discussed that because current genetic testing is not perfect, it is possible there may be a gene mutation in one of these genes that current testing cannot detect, but that chance is small.  There could be another gene that has not yet been discovered, or that we have not yet tested, that is responsible for the cancer diagnoses in the family. It is also possible there is a hereditary cause for the cancer in the family that Ms. Geisler did not inherit and therefore was not identified in her testing.  Therefore, it is important to remain in touch with cancer genetics in the future so that we can continue to offer Ms. Widrig the most up to date genetic testing.   ADDITIONAL GENETIC TESTING: We discussed with Ms. Yim that her genetic testing was fairly extensive.  If there are genes identified to increase  cancer risk that can be analyzed in the future, we would be happy to discuss and coordinate this testing at that time.    CANCER SCREENING RECOMMENDATIONS: Ms. Morning test result is considered negative (normal).  This means that we have not identified a hereditary cause for her  personal and family history of cancer at this time. Most cancers happen by chance and this negative test suggests that her cancer may fall into this category.    While reassuring, this does not definitively rule out a hereditary predisposition to cancer. It is still possible that there could be genetic mutations that are undetectable by current technology. There could be genetic mutations in genes that have not been tested or identified to increase cancer risk.  Therefore, it is recommended she continue to follow the cancer management and screening guidelines provided by her oncology and primary healthcare provider.   An individual's cancer risk and medical management are not determined by genetic test results alone. Overall cancer risk assessment incorporates additional  factors, including personal medical history, family history, and any available genetic information that may result in a personalized plan for cancer prevention and surveillance.  RECOMMENDATIONS FOR FAMILY MEMBERS:  Relatives in this family might be at some increased risk of developing cancer, over the general population risk, simply due to the family history of cancer.  We recommended female relatives in this family have a yearly mammogram beginning at age 58, or 1 years younger than the earliest onset of cancer, an annual clinical breast exam, and perform monthly breast self-exams. Female relatives in this family should also have a gynecological exam as recommended by their primary provider.  All family members should be referred for colonoscopy starting at age 51.    It is also possible there is a hereditary cause for the cancer in Ms. Zywicki's family that she did not inherit and therefore was not identified in her.  Based on Ms. Heady's family history, we recommended her brothers/paternal relatives have genetic counseling and testing. Ms. Goers will let us know if we can be of any assistance in coordinating genetic counseling and/or testing for these family members.  FOLLOW-UP: Lastly, we discussed with Ms. Rickles that cancer genetics is a rapidly advancing field and it is possible that new genetic tests will be appropriate for her and/or her family members in the future. We encouraged her to remain in contact with cancer genetics on an annual basis so we can update her personal and family histories and let her know of advances in cancer genetics that may benefit this family.   Our contact number was provided. Ms. Aufiero questions were answered to her satisfaction, and she knows she is welcome to call us at anytime with additional questions or concerns.   Faith Rogue, MS, [facility] Genetic Counselor [email or web address] Phone: [phone]

## 2021-05-15 LAB — SURGICAL PATHOLOGY

## 2021-05-18 ENCOUNTER — Other Ambulatory Visit: Payer: Self-pay | Admitting: General Surgery

## 2021-05-18 DIAGNOSIS — C50211 Malignant neoplasm of upper-inner quadrant of right female breast: Secondary | ICD-10-CM

## 2021-05-18 DIAGNOSIS — Z170 Estrogen receptor positive status [ER+]: Secondary | ICD-10-CM

## 2021-05-23 ENCOUNTER — Other Ambulatory Visit: Payer: Self-pay | Admitting: Internal Medicine

## 2021-05-24 ENCOUNTER — Other Ambulatory Visit: Payer: Self-pay

## 2021-05-24 ENCOUNTER — Inpatient Hospital Stay: Payer: Federal, State, Local not specified - PPO | Attending: Internal Medicine | Admitting: Internal Medicine

## 2021-05-24 DIAGNOSIS — C50211 Malignant neoplasm of upper-inner quadrant of right female breast: Secondary | ICD-10-CM

## 2021-05-24 DIAGNOSIS — Z170 Estrogen receptor positive status [ER+]: Secondary | ICD-10-CM | POA: Diagnosis not present

## 2021-05-24 NOTE — Assessment & Plan Note (Addendum)
#  Stage I T1c- ER positive; PR negative; her 2 negative; s/p lumpectomy clear margins.  #I reviewed the pathology stage with the patient in detail.  Patient currently awaiting Oncotype testing [Dr. Byrnett].  Discussed with patient difference between genomic testing versus genetic testing.  Discussed that based on genomic testing-would recommend chemotherapy or not.  Discussed with Dr. Bary Castilla.  #Follow-up patient with benefit from endocrine therapy. #Discussed the mechanism of action of aromatase inhibitors-with blocking of estrogen to prevent breast cancer.  Also discussed the potential side effects including but not limited to arthralgias hot flashes and increased risk of osteoporosis.  #Genetics: S/p counseling; reviewed genetic testing negative for any deleterious mutations.  # DISPOSITION: # referral to Dr.Crystal re: Breast cancer [Oncotype-pending] # Follow up in 6 weeks; MD; no labs-Dr.B

## 2021-05-24 NOTE — Progress Notes (Signed)
one [place] NOTE  Patient Care Team: Marinda Elk, MD as PCP - General (Physician Assistant) Theodore Demark, RN as Oncology Nurse Navigator  CHIEF COMPLAINTS/PURPOSE OF CONSULTATION: Breast cancer    Oncology History Overview Note  # June 2022- Targeted ultrasound was performed of the RIGHT upper inner breast. At 1:30 8 cm from the nipple, there is an irregular hypoechoic mass with indistinct margins. It measures approximately 10 x 5 x 6 mm. This corresponds to the site of screening mammographic concern.   Targeted ultrasound was performed of the RIGHT axilla. No suspicious axillary lymph nodes are seen.   IMPRESSION: 1. There is a 10 mm mass in the RIGHT upper inner breast which is concerning for malignancy. Recommend ultrasound-guided biopsy for definitive characterization.  DIAGNOSIS:  A. BREAST, RIGHT, 1:30 O'CLOCK 8 CM FROM NIPPLE; ULTRASOUND-GUIDED CORE  BIOPSY:  - INVASIVE MAMMARY CARCINOMA, NO SPECIAL TYPE.   Size of invasive carcinoma: 7 mm in this sample  Histologic grade of invasive carcinoma: Grade 2                       Glandular/tubular differentiation score: 3                       Nuclear pleomorphism score: 2                       Mitotic rate score: 1                       Total score: 6  Ductal carcinoma in situ: Present, intermediate grade  Lymphovascular invasion: Not identified   CASE SUMMARY: BREAST BIOMARKER TESTS  Estrogen Receptor (ER) Status: POSITIVE          Percentage of cells with nuclear positivity: Greater than 90%          Average intensity of staining: Strong   Progesterone Receptor (PgR) Status: NEGATIVE (less than 1%)          Internal control cells present and stain as expected   HER2 (by immunohistochemistry): NEGATIVE (Score 0)  TNM Descriptors: Not applicable  NA3F  Regional Lymph Nodes Modifier: Not applicable  pN0  pM - Not applicable  #  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   #Right  breast cancer status postlumpectomy   Carcinoma of upper-inner quadrant of right breast in female, estrogen receptor positive ([place])  04/18/2021 Initial Diagnosis   Carcinoma of upper-inner quadrant of right breast in female, estrogen receptor positive ([place])    Genetic Testing   Negative genetic testing. No pathogenic variants identified on the [facility] CancerNext-Expanded+RNA Panel. The report date is 04/30/2021.   The CancerNext-Expanded + RNAinsight gene panel offered by [employer] and includes sequencing and rearrangement analysis for the following 77 genes: IP, ALK, APC*, ATM*, AXIN2, BAP1, BARD1, BLM, BMPR1A, BRCA1*, BRCA2*, BRIP1*, CDC73, CDH1*,CDK4, CDKN1B, CDKN2A, CHEK2*, CTNNA1, DICER1, FANCC, FH, FLCN, GALNT12, KIF1B, LZTR1, MAX, MEN1, MET, MLH1*, MSH2*, MSH3, MSH6*, MUTYH*, NBN, NF1*, NF2, NTHL1, PALB2*, PHOX2B, PMS2*, POT1, PRKAR1A, PTCH1, PTEN*, RAD51C*, RAD51D*,RB1, RECQL, RET, SDHA, SDHAF2, SDHB, SDHC, SDHD, SMAD4, SMARCA4, SMARCB1, SMARCE1, STK11, SUFU, TMEM127, TP53*,TSC1, TSC2, VHL and XRCC2 (sequencing and deletion/duplication); EGFR, EGLN1, HOXB13, KIT, MITF, PDGFRA, POLD1 and POLE (sequencing only); EPCAM and GREM1 (deletion/duplication only).   05/25/2021 Cancer Staging   Staging form: Breast, AJCC 8th Edition - Pathologic: Stage IA (pT1c, pN0, cM0, G2, ER+, PR-, HER2-) -  Signed by Cammie Sickle, MD on 05/25/2021  Histologic grading system: 3 grade system        HISTORY OF PRESENTING ILLNESS:  Meghan Harper 65 y.o.  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  Patient interim was evaluated by surgery.  No surgical complication noted.  No fever no chills.  Review of Systems  Constitutional:  Negative for chills, diaphoresis, fever, malaise/fatigue and weight loss.  HENT:  Negative for nosebleeds and sore throat.   Eyes:  Negative for double vision.  Respiratory:  Negative for cough,  hemoptysis, sputum production, shortness of breath and wheezing.   Cardiovascular:  Negative for chest pain, palpitations, orthopnea and leg swelling.  Gastrointestinal:  Negative for abdominal pain, blood in stool, constipation, diarrhea, heartburn, melena, nausea and vomiting.  Genitourinary:  Negative for dysuria, frequency and urgency.  Musculoskeletal:  Negative for back pain and joint pain.  Skin: Negative.  Negative for itching and rash.  Neurological:  Negative for dizziness, tingling, focal weakness, weakness and headaches.  Endo/Heme/Allergies:  Does not bruise/bleed easily.  Psychiatric/Behavioral:  Negative for depression. The patient is not nervous/anxious and does not have insomnia.     MEDICAL HISTORY:  Past Medical History:  Diagnosis Date  . Arthritis   . Family history of breast cancer   . Family history of CLL (chronic lymphoid leukemia)   . Family history of colon cancer   . Family history of melanoma   . Family history of pancreatic cancer   . 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 Excised: 04/21/2010  . Pre-diabetes   . Wears contact lenses     SURGICAL HISTORY: Past Surgical History:  Procedure Laterality Date  . BREAST BIOPSY Right 1987, 2013   fibroadenoma x 2  . BREAST BIOPSY Right 04/06/2021   u/s bx-"venus" clip-path pendning  . BREAST EXCISIONAL BIOPSY Left 1978  . BREAST FIBROADENOMA SURGERY    . BREAST LUMPECTOMY WITH SENTINEL LYMPH NODE BIOPSY Right 05/08/2021   Procedure: BREAST LUMPECTOMY WITH SENTINEL LYMPH NODE BX;  Surgeon: Robert Bellow, MD;  Location: [facility] ORS;  Service: General;  Laterality: Right;  . CARPAL TUNNEL RELEASE Right 08/28/2017   Procedure: CARPAL TUNNEL RELEASE ENDOSCOPIC;  Surgeon: Corky Mull, MD;  Location: [place];  Service: Orthopedics;  Laterality: Right;  . CARPAL TUNNEL RELEASE Left 11/06/2017   Procedure: CARPAL TUNNEL RELEASE ENDOSCOPIC;  Surgeon: Corky Mull, MD;  Location: [place];  Service: Orthopedics;  Laterality: Left;  . COLONOSCOPY N/A 09/26/2015   Procedure: COLONOSCOPY;  Surgeon: Manya Silvas, MD;  Location: [facility] ENDOSCOPY;  Service: Endoscopy;  Laterality: N/A;  . TOOTH EXTRACTION      SOCIAL HISTORY: Social History   Socioeconomic History  . Marital status: Single    Spouse name: Not on file  . Number of children: Not on file  . Years of education: Not on file  . Highest education level: Not on file  Occupational History  . Not on file  Tobacco Use  . Smoking status: Never  . Smokeless tobacco: Never  Vaping Use  . Vaping Use: Never used  Substance and Sexual Activity  . Alcohol use: Yes    Alcohol/week: 0.0 standard drinks    Comment: may have a drink 3-4x/yr  . Drug use: No  . Sexual activity: Not on file  Other Topics Concern  .  Not on file  Social History Narrative  . Not on file   Social Determinants of Health   Financial Resource Strain: Not on file  Food Insecurity: Not on file  Transportation Needs: Not on file  Physical Activity: Not on file  Stress: Not on file  Social Connections: Not on file  Intimate Partner Violence: Not on file    FAMILY HISTORY: Family History  Problem Relation Age of Onset  . Colon cancer Mother        dx 52s  . Other Brother        CLL and skin cancers  . Breast cancer Maternal Aunt 60  . Lung cancer Maternal Aunt   . Breast cancer Paternal Aunt 70  . Pancreatic cancer Paternal Aunt   . Melanoma Maternal Uncle   . Breast cancer Niece        dx 57s, "BRCA pos"    ALLERGIES:  is allergic to wound dressing adhesive and meloxicam.  MEDICATIONS:  Current Outpatient Medications  Medication Sig Dispense Refill  . Calcium Carb-Cholecalciferol (CALCIUM 600 + D PO) Take 1 tablet by mouth 2 (two) times daily.    Marland Kitchen CO-ENZYME Q-10 PO Take 100 mg by mouth daily.    . fexofenadine (ALLEGRA) 60 MG tablet Take 60 mg by mouth daily.    .  Glucosamine-Chondroitin (MOVE FREE PO) Take 1 tablet by mouth daily.    Marland Kitchen HYDROcodone-acetaminophen (NORCO/VICODIN) 5-325 MG tablet Take 1 tablet by mouth every 4 (four) hours as needed for moderate pain. 20 tablet 0  . ibuprofen (ADVIL) 200 MG tablet Take 400 mg by mouth every 6 (six) hours as needed for mild pain.    Javier Docker Oil 350 MG CAPS Take 350 mg by mouth daily.    . Multiple Vitamin (MULTIVITAMIN) capsule Take 1 capsule by mouth daily. Centrum Silver    . Pamabrom 50 MG CAPS Take 50 mg by mouth daily as needed (water gain).    . Phenylephrine-Acetaminophen (TYLENOL SINUS+HEADACHE) 5-325 MG TABS Take 1 tablet by mouth daily as needed (Headache).    . Polyvinyl Alcohol-Povidone (CLEAR EYES ALL SEASONS) 5-6 MG/ML SOLN Place 1 drop into both eyes daily as needed (Dry eyes).    . Probiotic Product (PROBIOTIC-10 PO) Take 1 tablet by mouth daily.    . rosuvastatin (CRESTOR) 5 MG tablet Take 5 mg by mouth daily.    . Turmeric 500 MG CAPS Take 500 mg by mouth daily.     No current facility-administered medications for this visit.      Marland Kitchen  PHYSICAL EXAMINATION: ECOG PERFORMANCE STATUS: 0 - Asymptomatic  Vitals:   05/24/21 1458  BP: 136/74  Pulse: 75  Resp: 20  Temp: 97.6 F (36.4 C)   Filed Weights   05/24/21 1458  Weight: 189 lb (85.7 kg)    Physical Exam Vitals and nursing note reviewed.  Constitutional:      Comments: Ambulating: Independently;  With family-husband.  HENT:     Head: Normocephalic and atraumatic.     Mouth/Throat:     Pharynx: Oropharynx is clear.  Eyes:     Extraocular Movements: Extraocular movements intact.     Pupils: Pupils are equal, round, and reactive to light.  Cardiovascular:     Rate and Rhythm: Normal rate and regular rhythm.  Pulmonary:     Comments: Decreased breath sounds bilaterally.  Abdominal:     Palpations: Abdomen is soft.  Musculoskeletal:        General: Normal range of  motion.     Cervical back: Normal range of motion.   Skin:    General: Skin is warm.  Neurological:     General: No focal deficit present.     Mental Status: She is alert and oriented to person, place, and time.  Psychiatric:        Behavior: Behavior normal.        Judgment: Judgment normal.     LABORATORY DATA:  I have reviewed the data as listed Lab Results  Component Value Date   WBC 6.4 04/18/2021   HGB 13.8 04/18/2021   HCT 40.5 04/18/2021   MCV 88.4 04/18/2021   PLT 242 04/18/2021   Recent Labs    04/18/21 1459  NA 137  K 4.1  CL 102  CO2 26  GLUCOSE 102*  BUN 12  CREATININE 0.66  CALCIUM 9.2  GFRNONAA >60  PROT 7.5  ALBUMIN 4.7  AST 31  ALT 30  ALKPHOS 50  BILITOT 0.7    RADIOGRAPHIC STUDIES: I have personally reviewed the radiological images as listed and agreed with the findings in the report. NM Sentinel Node Inj-No Rpt (Breast)  Result Date: 05/08/2021 Sulfur Colloid was injected by the Nuclear Medicine Technologist for sentinel lymph node localization.   MM BREAST SURGICAL SPECIMEN  Result Date: 05/08/2021 CLINICAL DATA:  Right lumpectomy for recently diagnosed invasive mammary carcinoma and ductal carcinoma in situ in the 1:30 o'clock position of the right breast, marked with a Venus shaped biopsy marker clip. EXAM: SPECIMEN RADIOGRAPH OF THE RIGHT BREAST COMPARISON:  Previous exam(s). FINDINGS: Status post excision of the right breast. A spiculated mass and Venus shaped clip are present within the specimen. IMPRESSION: Specimen radiograph of the right breast. Electronically Signed   By: Claudie Revering M.D.   On: 05/08/2021 15:57   ASSESSMENT & PLAN:   Carcinoma of upper-inner quadrant of right breast in female, estrogen receptor positive ([place]) #Stage I T1c- ER positive; PR negative; her 2 negative; s/p lumpectomy clear margins.  #I reviewed the pathology stage with the patient in detail.  Patient currently awaiting Oncotype testing [Dr. Byrnett].  Discussed with patient difference between genomic  testing versus genetic testing.  Discussed that based on genomic testing-would recommend chemotherapy or not.  Discussed with Dr. Bary Castilla.  #Follow-up patient with benefit from endocrine therapy. #Discussed the mechanism of action of aromatase inhibitors-with blocking of estrogen to prevent breast cancer.  Also discussed the potential side effects including but not limited to arthralgias hot flashes and increased risk of osteoporosis.  #Genetics: S/p counseling; reviewed genetic testing negative for any deleterious mutations.  # DISPOSITION: # referral to Dr.Crystal re: Breast cancer [Oncotype-pending] # Follow up in 6 weeks; MD; no labs-Dr.B  All questions were answered. The patient/family knows to call the clinic with any problems, questions or concerns.    Cammie Sickle, MD 05/25/2021 7:25 AM

## 2021-06-08 ENCOUNTER — Institutional Professional Consult (permissible substitution): Payer: Federal, State, Local not specified - PPO | Admitting: Radiation Oncology

## 2021-06-14 ENCOUNTER — Encounter: Payer: Self-pay | Admitting: Radiation Oncology

## 2021-06-14 ENCOUNTER — Ambulatory Visit
Admission: RE | Admit: 2021-06-14 | Discharge: 2021-06-14 | Disposition: A | Discharge status: Home / Self Care | Payer: Federal, State, Local not specified - PPO | Source: Ambulatory Visit | Attending: Radiation Oncology | Admitting: Radiation Oncology

## 2021-06-14 DIAGNOSIS — Z803 Family history of malignant neoplasm of breast: Secondary | ICD-10-CM | POA: Diagnosis not present

## 2021-06-14 DIAGNOSIS — C50211 Malignant neoplasm of upper-inner quadrant of right female breast: Secondary | ICD-10-CM | POA: Diagnosis present

## 2021-06-14 DIAGNOSIS — Z801 Family history of malignant neoplasm of trachea, bronchus and lung: Secondary | ICD-10-CM | POA: Insufficient documentation

## 2021-06-14 DIAGNOSIS — M129 Arthropathy, unspecified: Secondary | ICD-10-CM | POA: Insufficient documentation

## 2021-06-14 DIAGNOSIS — Z806 Family history of leukemia: Secondary | ICD-10-CM | POA: Insufficient documentation

## 2021-06-14 DIAGNOSIS — Z800 Family history of malignant neoplasm of digestive organs: Secondary | ICD-10-CM | POA: Insufficient documentation

## 2021-06-14 DIAGNOSIS — Z170 Estrogen receptor positive status [ER+]: Secondary | ICD-10-CM | POA: Insufficient documentation

## 2021-06-14 NOTE — Consult Note (Signed)
NEW PATIENT EVALUATION  Name: Meghan Harper  MRN: [id number]  Date:   06/14/2021     DOB: 1956-06-27   This 65 y.o. female patient presents to the clinic for initial evaluation of stage Ia (T1 cN0 M0) ER positive PR negative HER2/neu not overexpressed invasive mammary carcinoma of the right breast status post wide local excision and sentinel node biopsy.  REFERRING PHYSICIAN: Marinda Elk, MD  CHIEF COMPLAINT:  Chief Complaint  Patient presents with   Breast Cancer    Initial consult    DIAGNOSIS: The encounter diagnosis was Carcinoma of upper-inner quadrant of right breast in female, estrogen receptor positive ([place]).   PREVIOUS INVESTIGATIONS:  Mammogram ultrasound reviewed Pathology report reviewed Clinical notes reviewed  HPI: Patient is a 65 year old female who presents with an abnormal mammogram of her right breast showing possible mass with distortion.  Spot compression is demonstrated architectural distortion in the right upper inner breast posterior depth measuring approximate 9 mm.  She underwent ultrasound-guided biopsy which was positive for invasive mammary carcinoma.  She went on to have a wide local excision for a 1.1 x 8.8 x 0.5 cm invasive mammary carcinoma overall grade 2.  Margins were clear by about 5 mm.  4 sentinel lymph nodes were all negative for metastatic disease.  Tumor was ER positive PR negative and HER2/neu not overexpressed.  Oncotype DX has been ordered and is pending.  She is now referred to radiation collagen for consideration of treatment she is doing well she specifically denies breast tenderness cough or bone pain.  PLANNED TREATMENT REGIMEN: Right whole breast radiation hypofractionated  PAST MEDICAL HISTORY:  has a past medical history of Arthritis, Family history of breast cancer, Family history of CLL (chronic lymphoid leukemia), Family history of colon cancer, Family history of melanoma, Family history of pancreatic cancer, Melanoma in  situ ([place]) (04/14/2010), Pre-diabetes, and Wears contact lenses.    PAST SURGICAL HISTORY:  Past Surgical History:  Procedure Laterality Date   BREAST BIOPSY Right 1987, 2013   fibroadenoma x 2   BREAST BIOPSY Right 04/06/2021   u/s bx-"venus" clip-path pendning   BREAST EXCISIONAL BIOPSY Left 1978   BREAST FIBROADENOMA SURGERY     BREAST LUMPECTOMY WITH SENTINEL LYMPH NODE BIOPSY Right 05/08/2021   Procedure: BREAST LUMPECTOMY WITH SENTINEL LYMPH NODE BX;  Surgeon: Robert Bellow, MD;  Location: [facility] ORS;  Service: General;  Laterality: Right;   CARPAL TUNNEL RELEASE Right 08/28/2017   Procedure: CARPAL TUNNEL RELEASE ENDOSCOPIC;  Surgeon: Corky Mull, MD;  Location: [place];  Service: Orthopedics;  Laterality: Right;   CARPAL TUNNEL RELEASE Left 11/06/2017   Procedure: CARPAL TUNNEL RELEASE ENDOSCOPIC;  Surgeon: Corky Mull, MD;  Location: [place];  Service: Orthopedics;  Laterality: Left;   COLONOSCOPY N/A 09/26/2015   Procedure: COLONOSCOPY;  Surgeon: Manya Silvas, MD;  Location: [facility] ENDOSCOPY;  Service: Endoscopy;  Laterality: N/A;   TOOTH EXTRACTION      FAMILY HISTORY: family history includes Breast cancer in her niece; Breast cancer (age of onset: 74) in her maternal aunt and paternal aunt; Colon cancer in her mother; Lung cancer in her maternal aunt; Melanoma in her maternal uncle; Other in her brother; Pancreatic cancer in her paternal aunt.  SOCIAL HISTORY:  reports that she has never smoked. She has never used smokeless tobacco. She reports current alcohol use. She reports that she does not use drugs.  ALLERGIES: Wound dressing adhesive and Meloxicam  MEDICATIONS:  Current Outpatient  Medications  Medication Sig Dispense Refill   Calcium Carb-Cholecalciferol (CALCIUM 600 + D PO) Take 1 tablet by mouth 2 (two) times daily.     CO-ENZYME Q-10 PO Take 100 mg by mouth daily.     fexofenadine (ALLEGRA) 60 MG tablet Take 60 mg by mouth daily.      Glucosamine-Chondroitin (MOVE FREE PO) Take 1 tablet by mouth daily.     ibuprofen (ADVIL) 200 MG tablet Take 400 mg by mouth every 6 (six) hours as needed for mild pain.     Krill Oil 350 MG CAPS Take 350 mg by mouth daily.     Multiple Vitamin (MULTIVITAMIN) capsule Take 1 capsule by mouth daily. Centrum Silver     Pamabrom 50 MG CAPS Take 50 mg by mouth daily as needed (water gain).     Phenylephrine-Acetaminophen (TYLENOL SINUS+HEADACHE) 5-325 MG TABS Take 1 tablet by mouth daily as needed (Headache).     Polyvinyl Alcohol-Povidone (CLEAR EYES ALL SEASONS) 5-6 MG/ML SOLN Place 1 drop into both eyes daily as needed (Dry eyes).     Probiotic Product (PROBIOTIC-10 PO) Take 1 tablet by mouth daily.     rosuvastatin (CRESTOR) 5 MG tablet Take 5 mg by mouth daily.     Turmeric 500 MG CAPS Take 500 mg by mouth daily. (Patient not taking: Reported on 06/14/2021)     No current facility-administered medications for this encounter.    ECOG PERFORMANCE STATUS:  0 - Asymptomatic  REVIEW OF SYSTEMS: Patient denies any weight loss, fatigue, weakness, fever, chills or night sweats. Patient denies any loss of vision, blurred vision. Patient denies any ringing  of the ears or hearing loss. No irregular heartbeat. Patient denies heart murmur or history of fainting. Patient denies any chest pain or pain radiating to her upper extremities. Patient denies any shortness of breath, difficulty breathing at night, cough or hemoptysis. Patient denies any swelling in the lower legs. Patient denies any nausea vomiting, vomiting of blood, or coffee ground material in the vomitus. Patient denies any stomach pain. Patient states has had normal bowel movements no significant constipation or diarrhea. Patient denies any dysuria, hematuria or significant nocturia. Patient denies any problems walking, swelling in the joints or loss of balance. Patient denies any skin changes, loss of hair or loss of weight. Patient denies any  excessive worrying or anxiety or significant depression. Patient denies any problems with insomnia. Patient denies excessive thirst, polyuria, polydipsia. Patient denies any swollen glands, patient denies easy bruising or easy bleeding. Patient denies any recent infections, allergies or URI. Patient "s visual fields have not changed significantly in recent time.   PHYSICAL EXAM: BP (!) (P) 147/77 (BP Location: Left Arm, Patient Position: Sitting)   Pulse (P) 75   Temp (P) 98.5 F (36.9 C) (Tympanic)   Resp (P) 16   Wt (P) 191 lb (86.6 kg)   BMI (P) 33.30 kg/m  Patient is a wide local excision scar which is healing well.  No dominant masses noted in either breast.  No axillary or supraclavicular adenopathy is appreciated.  Well-developed well-nourished patient in NAD. HEENT reveals PERLA, EOMI, discs not visualized.  Oral cavity is clear. No oral mucosal lesions are identified. Neck is clear without evidence of cervical or supraclavicular adenopathy. Lungs are clear to A&P. Cardiac examination is essentially unremarkable with regular rate and rhythm without murmur rub or thrill. Abdomen is benign with no organomegaly or masses noted. Motor sensory and DTR levels are equal and symmetric in the  upper and lower extremities. Cranial nerves II through XII are grossly intact. Proprioception is intact. No peripheral adenopathy or edema is identified. No motor or sensory levels are noted. Crude visual fields are within normal range.  LABORATORY DATA: Pathology report reviewed    RADIOLOGY RESULTS: Mammogram and ultrasound reviewed   IMPRESSION: Stage Ia ER positive invasive mammary carcinoma the right breast status post wide local excision with Oncotype DX pending in 65 year old female  PLAN: Present time I have recommended whole breast radiation I will treat hypofractionated course of treatment to her whole breast over 3 weeks boosting her scar another 1000 cGy using electron beam.  Risks and benefits  of treatment including skin reaction fatigue alteration blood counts possible inclusion of superficial lung all were reviewed in detail with the patient.  I have tentatively set her up for simulation the end of next week hopefully the Oncotype DX will be back by then we will not proceed with simulation until that is recorded.  Patient comprehends my recommendations well.  She also will benefit from antiestrogen therapy after completion of radiation.  I would like to take this opportunity to thank you for allowing me to participate in the care of your patient.Noreene Filbert, MD

## 2021-06-15 ENCOUNTER — Institutional Professional Consult (permissible substitution): Payer: Federal, State, Local not specified - PPO | Admitting: Radiation Oncology

## 2021-06-20 ENCOUNTER — Encounter: Payer: Self-pay | Admitting: General Surgery

## 2021-06-22 ENCOUNTER — Other Ambulatory Visit: Payer: Self-pay | Admitting: *Deleted

## 2021-06-22 ENCOUNTER — Ambulatory Visit
Admission: RE | Admit: 2021-06-22 | Discharge: 2021-06-22 | Disposition: A | Discharge status: Home / Self Care | Payer: Federal, State, Local not specified - PPO | Source: Ambulatory Visit | Attending: Radiation Oncology | Admitting: Radiation Oncology

## 2021-06-22 DIAGNOSIS — C50211 Malignant neoplasm of upper-inner quadrant of right female breast: Secondary | ICD-10-CM

## 2021-06-22 DIAGNOSIS — Z510 Encounter for antineoplastic radiation therapy: Secondary | ICD-10-CM | POA: Diagnosis present

## 2021-06-23 DIAGNOSIS — Z510 Encounter for antineoplastic radiation therapy: Secondary | ICD-10-CM | POA: Diagnosis not present

## 2021-06-29 ENCOUNTER — Ambulatory Visit: Admission: RE | Admit: 2021-06-29 | Payer: Federal, State, Local not specified - PPO | Source: Ambulatory Visit

## 2021-06-29 DIAGNOSIS — Z510 Encounter for antineoplastic radiation therapy: Secondary | ICD-10-CM | POA: Diagnosis not present

## 2021-07-03 ENCOUNTER — Ambulatory Visit
Admission: RE | Admit: 2021-07-03 | Discharge: 2021-07-03 | Disposition: A | Discharge status: Home / Self Care | Payer: Federal, State, Local not specified - PPO | Source: Ambulatory Visit | Attending: Radiation Oncology | Admitting: Radiation Oncology

## 2021-07-03 DIAGNOSIS — Z510 Encounter for antineoplastic radiation therapy: Secondary | ICD-10-CM | POA: Diagnosis not present

## 2021-07-04 ENCOUNTER — Ambulatory Visit: Payer: Federal, State, Local not specified - PPO | Admitting: Dermatology

## 2021-07-04 ENCOUNTER — Other Ambulatory Visit: Payer: Self-pay

## 2021-07-04 ENCOUNTER — Ambulatory Visit
Admission: RE | Admit: 2021-07-04 | Discharge: 2021-07-04 | Disposition: A | Discharge status: Home / Self Care | Payer: Federal, State, Local not specified - PPO | Source: Ambulatory Visit | Attending: Radiation Oncology | Admitting: Radiation Oncology

## 2021-07-04 DIAGNOSIS — D225 Melanocytic nevi of trunk: Secondary | ICD-10-CM

## 2021-07-04 DIAGNOSIS — L738 Other specified follicular disorders: Secondary | ICD-10-CM

## 2021-07-04 DIAGNOSIS — D229 Melanocytic nevi, unspecified: Secondary | ICD-10-CM

## 2021-07-04 DIAGNOSIS — D1800 Hemangioma unspecified site: Secondary | ICD-10-CM

## 2021-07-04 DIAGNOSIS — L570 Actinic keratosis: Secondary | ICD-10-CM

## 2021-07-04 DIAGNOSIS — L820 Inflamed seborrheic keratosis: Secondary | ICD-10-CM | POA: Diagnosis not present

## 2021-07-04 DIAGNOSIS — Z1283 Encounter for screening for malignant neoplasm of skin: Secondary | ICD-10-CM

## 2021-07-04 DIAGNOSIS — L817 Pigmented purpuric dermatosis: Secondary | ICD-10-CM

## 2021-07-04 DIAGNOSIS — D489 Neoplasm of uncertain behavior, unspecified: Secondary | ICD-10-CM

## 2021-07-04 DIAGNOSIS — L821 Other seborrheic keratosis: Secondary | ICD-10-CM | POA: Diagnosis not present

## 2021-07-04 DIAGNOSIS — Z86006 Personal history of melanoma in-situ: Secondary | ICD-10-CM

## 2021-07-04 DIAGNOSIS — Z510 Encounter for antineoplastic radiation therapy: Secondary | ICD-10-CM | POA: Diagnosis not present

## 2021-07-04 DIAGNOSIS — L814 Other melanin hyperpigmentation: Secondary | ICD-10-CM

## 2021-07-04 DIAGNOSIS — L578 Other skin changes due to chronic exposure to nonionizing radiation: Secondary | ICD-10-CM

## 2021-07-04 NOTE — Progress Notes (Signed)
Follow-Up Visit   Subjective  Meghan Harper is a 65 y.o. female who presents for the following: Annual Exam (Patient here today for full body exam. 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 Patient reports a spot at bottom of left foot. Patient reports some raised areas she nicks when shaving at legs. ). The spot on her foot is getting larger and gets irritated, and she would like it removed. Patient states she was diagnosed in may with breast cancer and started on radiation treatment yesterday.   Patient here for full body skin exam and skin cancer screening.  The following portions of the chart were reviewed this encounter and updated as appropriate:      Objective  Well appearing patient in no apparent distress; mood and affect are within normal limits.  A full examination was performed including scalp, head, eyes, ears, nose, lips, neck, chest, axillae, abdomen, back, buttocks, bilateral upper extremities, bilateral lower extremities, hands, feet, fingers, toes, fingernails, and toenails. All findings within normal limits unless otherwise noted below.  left plantar foot 1st webspace 6 x 4 mm flesh colored pedunculated nodule      left buttock 7 x 4 mm medium dark brown macule- no changes   right medial clavicle 8 x 3 mm waxy tan macule two toned.   right anterior ankle x 1, left medial thigh x 1 (2) Erythematous keratotic or waxy stuck-on papule   bilateral legs Cayenne pepper macules at ankle area, mild edema  Assessment & Plan  Neoplasm of uncertain behavior left plantar foot 1st webspace  Epidermal / dermal shaving  Lesion diameter (cm):  0.6 Informed consent: discussed and consent obtained   Patient was prepped and draped in usual sterile fashion: Area prepped with alcohol. Anesthesia: the lesion was anesthetized in a standard fashion   Anesthetic:  1% lidocaine w/ epinephrine 1-100,000 buffered w/ 8.4% NaHCO3 Instrument used: flexible  razor blade   Hemostasis achieved with: pressure, aluminum chloride and electrodesiccation   Outcome: patient tolerated procedure well   Post-procedure details: wound care instructions given   Post-procedure details comment:  Ointment and small bandage applied.   Specimen 1 - Surgical pathology Differential Diagnosis: r/o fibroma vs skin tag vs nevus   Check Margins: No  R/o fibroma vs skin tag vs nevus   Nevus left buttock  Benign-appearing.  Stable. Observation.  Call clinic for new or changing lesions.  Recommend daily use of broad spectrum spf 30+ sunscreen to sun-exposed areas.    Seborrheic keratosis right medial clavicle  Benign-appearing.  Observation.  Call clinic for new or changing lesions.  Recommend daily use of broad spectrum spf 30+ sunscreen to sun-exposed areas.    Inflamed seborrheic keratosis right anterior ankle x 1, left medial thigh x 1  Destruction of lesion - right anterior ankle x 1, left medial thigh x 1  Destruction method: cryotherapy   Informed consent: discussed and consent obtained   Lesion destroyed using liquid nitrogen: Yes   Region frozen until ice ball extended beyond lesion: Yes   Outcome: patient tolerated procedure well with no complications   Post-procedure details: wound care instructions given   Additional details:  Prior to procedure, discussed risks of blister formation, small wound, skin dyspigmentation, or rare scar following cryotherapy. Recommend Vaseline ointment to treated areas while healing.   Schamberg's purpura bilateral legs  Benign, observe.   Recommend compression socks daily   Lentigines - Scattered tan macules - Due to sun  exposure - Benign-appering, observe - Recommend daily broad spectrum sunscreen SPF 30+ to sun-exposed areas, reapply every 2 hours as needed. - Call for any changes  Sebaceous Hyperplasia - Small yellow papules with a central dell at face  - Benign - Observe  Seborrheic Keratoses -  Stuck-on, waxy, tan-brown papules and/or plaques  - Benign-appearing - Discussed benign etiology and prognosis. - Observe - Call for any changes  Melanocytic Nevi - Tan-brown and/or pink-flesh-colored symmetric macules and papules - Benign appearing on exam today - Observation - Call clinic for new or changing moles - Recommend daily use of broad spectrum spf 30+ sunscreen to sun-exposed areas.   Hemangiomas - Red papules - Discussed benign nature - Observe - Call for any changes  Actinic Damage - Chronic condition, secondary to cumulative UV/sun exposure - diffuse scaly erythematous macules with underlying dyspigmentation - Recommend daily broad spectrum sunscreen SPF 30+ to sun-exposed areas, reapply every 2 hours as needed.  - Staying in the shade or wearing long sleeves, sun glasses (UVA+UVB protection) and wide brim hats (4-inch brim around the entire circumference of the hat) are also recommended for sun protection.  - Call for new or changing lesions.  History of Melanoma in Situ 2011 - No evidence of recurrence today at right upper back  - Recommend regular full body skin exams - Recommend daily broad spectrum sunscreen SPF 30+ to sun-exposed areas, reapply every 2 hours as needed.  - Call if any new or changing lesions are noted between office visits  Skin cancer screening performed today.  Return in about 1 year (around 07/04/2022) for tbse. I, Ruthell Rummage, CMA, am acting as scribe for Brendolyn Patty, MD.  Documentation: I have reviewed the above documentation for accuracy and completeness, and I agree with the above.  Brendolyn Patty MD

## 2021-07-04 NOTE — Patient Instructions (Addendum)
Biopsy Wound Care Instructions  Leave the original bandage on for 24 hours if possible.  If the bandage becomes soaked or soiled before that time, it is OK to remove it and examine the wound.  A small amount of post-operative bleeding is normal.  If excessive bleeding occurs, remove the bandage, place gauze over the site and apply continuous pressure (no peeking) over the area for 30 minutes. If this does not work, please call our clinic as soon as possible or page your doctor if it is after hours.   Once a day, cleanse the wound with soap and water. It is fine to shower. If a thick crust develops you may use a Q-tip dipped into dilute hydrogen peroxide (mix 1:1 with water) to dissolve it.  Hydrogen peroxide can slow the healing process, so use it only as needed.    After washing, apply petroleum jelly (Vaseline) or an antibiotic ointment if your doctor prescribed one for you, followed by a bandage.    For best healing, the wound should be covered with a layer of ointment at all times. If you are not able to keep the area covered with a bandage to hold the ointment in place, this may mean re-applying the ointment several times a day.  Continue this wound care until the wound has healed and is no longer open.   Itching and mild discomfort is normal during the healing process. However, if you develop pain or severe itching, please call our office.   If you have any discomfort, you can take Tylenol (acetaminophen) or ibuprofen as directed on the bottle. (Please do not take these if you have an allergy to them or cannot take them for another reason).  Some redness, tenderness and white or yellow material in the wound is normal healing.  If the area becomes very sore and red, or develops a thick yellow-green material (pus), it may be infected; please notify us.    If you have stitches, return to clinic as directed to have the stitches removed. You will continue wound care for 2-3 days after the stitches  are removed.   Wound healing continues for up to one year following surgery. It is not unusual to experience pain in the scar from time to time during the interval.  If the pain becomes severe or the scar thickens, you should notify the office.    A slight amount of redness in a scar is expected for the first six months.  After six months, the redness will fade and the scar will soften and fade.  The color difference becomes less noticeable with time.  If there are any problems, return for a post-op surgery check at your earliest convenience.  To improve the appearance of the scar, you can use silicone scar gel, cream, or sheets (such as Mederma or Serica) every night for up to one year. These are available over the counter (without a prescription).  Please call our office at [phone] for any questions or concerns.  Melanoma ABCDEs  Melanoma is the most dangerous type of skin cancer, and is the leading cause of death from skin disease.  You are more likely to develop melanoma if you: Have light-colored skin, light-colored eyes, or red or blond hair Spend a lot of time in the sun Tan regularly, either outdoors or in a tanning bed Have had blistering sunburns, especially during childhood Have a close family member who has had a melanoma Have atypical moles or large birthmarks  Early  detection of melanoma is key since treatment is typically straightforward and cure rates are extremely high if we catch it early.   The first sign of melanoma is often a change in a mole or a new dark spot.  The ABCDE system is a way of remembering the signs of melanoma.  A for asymmetry:  The two halves do not match. B for border:  The edges of the growth are irregular. C for color:  A mixture of colors are present instead of an even brown color. D for diameter:  Melanomas are usually (but not always) greater than 19m - the size of a pencil eraser. E for evolution:  The spot keeps changing in size, shape,  and color.  Please check your skin once per month between visits. You can use a small mirror in front and a large mirror behind you to keep an eye on the back side or your body.   If you see any new or changing lesions before your next follow-up, please call to schedule a visit.  Please continue daily skin protection including broad spectrum sunscreen SPF 30+ to sun-exposed areas, reapplying every 2 hours as needed when you're outdoors.   Staying in the shade or wearing long sleeves, sun glasses (UVA+UVB protection) and wide brim hats (4-inch brim around the entire circumference of the hat) are also recommended for sun protection.    If you have any questions or concerns for your doctor, please call our main line at [phone] press option 4 to reach your doctor's medical assistant. If no one answers, please leave a voicemail as directed and we will return your call as soon as possible. Messages left after 4 pm will be answered the following business day.   You may also send [place] message via [facility] We typically respond to MyChart messages within 1-2 business days.  For prescription refills, please ask your pharmacy to contact our office. Our fax number is [phone]  If you have an urgent issue when the clinic is closed that cannot wait until the next business day, you can page your doctor at the number below.    Please note that while we do our best to be available for urgent issues outside of office hours, we are not available 24/7.   If you have an urgent issue and are unable to reach [place] you may choose to seek medical care at your doctor's office, retail clinic, urgent care center, or emergency room.  If you have a medical emergency, please immediately call 911 or go to the emergency department.  Pager Numbers  - Dr. KNehemiah Massed [phone] - Dr. MLaurence Ferrari [phone] - Dr. SNicole Kindred [phone] In the event of inclement weather, please call our main line at [phone] for an update on the status of any delays or closures.  Dermatology Medication Tips: Please keep the boxes that topical medications come in in order to help keep track of the instructions about where and how to use these. Pharmacies typically print the medication instructions only on the boxes and not directly on the medication tubes.   If your medication is too expensive, please contact our office at [phone] 4 or send [place] message through [place]   We are unable to tell what your co-pay for medications will be in advance as this is different depending on your insurance coverage. However, we may be able to find a substitute medication at lower cost or fill out paperwork to get insurance  to cover a needed medication.   If a prior authorization is required to get your medication covered by your insurance company, please allow [place] 1-2 business days to complete this process.  Drug prices often vary depending on where the prescription is filled and some pharmacies may offer cheaper prices.  The website www.goodrx.com contains coupons for medications through different pharmacies. The prices here do not account for what the cost may be with help from insurance (it may be cheaper with your insurance), but the website can give you the price if you did not use any insurance.  - You can print the associated coupon and take it with your prescription to the pharmacy.  - You may also stop by our office during regular business hours and pick up a GoodRx coupon card.  - If you need your prescription sent electronically to a different pharmacy, notify our office through [facility] or by phone at [phone] option 4.

## 2021-07-05 ENCOUNTER — Ambulatory Visit
Admission: RE | Admit: 2021-07-05 | Discharge: 2021-07-05 | Disposition: A | Discharge status: Home / Self Care | Payer: Federal, State, Local not specified - PPO | Source: Ambulatory Visit | Attending: Radiation Oncology | Admitting: Radiation Oncology

## 2021-07-05 ENCOUNTER — Inpatient Hospital Stay: Payer: Federal, State, Local not specified - PPO | Attending: Internal Medicine | Admitting: Internal Medicine

## 2021-07-05 ENCOUNTER — Telehealth: Payer: Self-pay | Admitting: *Deleted

## 2021-07-05 DIAGNOSIS — M810 Age-related osteoporosis without current pathological fracture: Secondary | ICD-10-CM | POA: Insufficient documentation

## 2021-07-05 DIAGNOSIS — C50211 Malignant neoplasm of upper-inner quadrant of right female breast: Secondary | ICD-10-CM | POA: Diagnosis present

## 2021-07-05 DIAGNOSIS — Z170 Estrogen receptor positive status [ER+]: Secondary | ICD-10-CM | POA: Insufficient documentation

## 2021-07-05 DIAGNOSIS — Z510 Encounter for antineoplastic radiation therapy: Secondary | ICD-10-CM | POA: Diagnosis not present

## 2021-07-05 NOTE — Progress Notes (Signed)
Patient wants to know does radiation effect her resistance to disease and vaccines.

## 2021-07-05 NOTE — Assessment & Plan Note (Addendum)
#  Stage I T1c- ER positive; PR negative; her 2 negative; s/p lumpectomy clear margins. 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  No significant benefit from chemotherapy.  #I reviewed the Oncotype in detail.  Continue radiation until 10/10.  # #Discussed the mechanism of action of aromatase inhibitors-with blocking of estrogen to prevent breast cancer.  Also discussed the potential side effects including but not limited to arthralgias hot flashes and increased risk of osteoporosis.  #Osteoporosis screening: Last bone density 2018; recommend bone density prior to next visit.  This will have to be ordered by PCP/given previous bone densities.  # COVID vaccines/Booster; wait to finish for RT;   # DISPOSITION: # Follow up in 2 months-MD; no labs; BMD prior--Dr.B

## 2021-07-05 NOTE — Progress Notes (Signed)
one [place] NOTE  Patient Care Team: Marinda Elk, MD as PCP - General (Physician Assistant) Theodore Demark, RN as Oncology Nurse Navigator  CHIEF COMPLAINTS/PURPOSE OF CONSULTATION: Breast cancer    Oncology History Overview Note  # June 2022- Targeted ultrasound was performed of the RIGHT upper inner breast. At 1:30 8 cm from the nipple, there is an irregular hypoechoic mass with indistinct margins. It measures approximately 10 x 5 x 6 mm. This corresponds to the site of screening mammographic concern.   Targeted ultrasound was performed of the RIGHT axilla. No suspicious axillary lymph nodes are seen.   IMPRESSION: 1. There is a 10 mm mass in the RIGHT upper inner breast which is concerning for malignancy. Recommend ultrasound-guided biopsy for definitive characterization.  DIAGNOSIS:  A. BREAST, RIGHT, 1:30 O'CLOCK 8 CM FROM NIPPLE; ULTRASOUND-GUIDED CORE  BIOPSY:  - INVASIVE MAMMARY CARCINOMA, NO SPECIAL TYPE.   Size of invasive carcinoma: 7 mm in this sample  Histologic grade of invasive carcinoma: Grade 2                       Glandular/tubular differentiation score: 3                       Nuclear pleomorphism score: 2                       Mitotic rate score: 1                       Total score: 6  Ductal carcinoma in situ: Present, intermediate grade  Lymphovascular invasion: Not identified   CASE SUMMARY: BREAST BIOMARKER TESTS  Estrogen Receptor (ER) Status: POSITIVE          Percentage of cells with nuclear positivity: Greater than 90%          Average intensity of staining: Strong   Progesterone Receptor (PgR) Status: NEGATIVE (less than 1%)          Internal control cells present and stain as expected   HER2 (by immunohistochemistry): NEGATIVE (Score 0)  TNM Descriptors: Not applicable  NA3F  Regional Lymph Nodes Modifier: Not applicable  pN0  pM - Not applicable  #  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   #Right  breast cancer status postlumpectomy   Carcinoma of upper-inner quadrant of right breast in female, estrogen receptor positive ([place])  04/18/2021 Initial Diagnosis   Carcinoma of upper-inner quadrant of right breast in female, estrogen receptor positive ([place])    Genetic Testing   Negative genetic testing. No pathogenic variants identified on the [employer] CancerNext-Expanded+RNA Panel. The report date is 04/30/2021.   The CancerNext-Expanded + RNAinsight gene panel offered by [employer] and includes sequencing and rearrangement analysis for the following 77 genes: IP, ALK, APC*, ATM*, AXIN2, BAP1, BARD1, BLM, BMPR1A, BRCA1*, BRCA2*, BRIP1*, CDC73, CDH1*,CDK4, CDKN1B, CDKN2A, CHEK2*, CTNNA1, DICER1, FANCC, FH, FLCN, GALNT12, KIF1B, LZTR1, MAX, MEN1, MET, MLH1*, MSH2*, MSH3, MSH6*, MUTYH*, NBN, NF1*, NF2, NTHL1, PALB2*, PHOX2B, PMS2*, POT1, PRKAR1A, PTCH1, PTEN*, RAD51C*, RAD51D*,RB1, RECQL, RET, SDHA, SDHAF2, SDHB, SDHC, SDHD, SMAD4, SMARCA4, SMARCB1, SMARCE1, STK11, SUFU, TMEM127, TP53*,TSC1, TSC2, VHL and XRCC2 (sequencing and deletion/duplication); EGFR, EGLN1, HOXB13, KIT, MITF, PDGFRA, POLD1 and POLE (sequencing only); EPCAM and GREM1 (deletion/duplication only).   05/25/2021 Cancer Staging   Staging form: Breast, AJCC 8th Edition - Pathologic: Stage IA (pT1c, pN0, cM0, G2, ER+, PR-, HER2-) -  Signed by Cammie Sickle, MD on 05/25/2021 Histologic grading system: 3 grade system       HISTORY OF PRESENTING ILLNESS:  Meghan Harper 65 y.o.  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  Patient is currently undergoing radiation.  Tolerating well.No side effects noted.   Review of Systems  Constitutional:  Negative for chills, diaphoresis, fever, malaise/fatigue and weight loss.  HENT:  Negative for nosebleeds and sore throat.   Eyes:  Negative for double vision.  Respiratory:  Negative for cough,  hemoptysis, sputum production, shortness of breath and wheezing.   Cardiovascular:  Negative for chest pain, palpitations, orthopnea and leg swelling.  Gastrointestinal:  Negative for abdominal pain, blood in stool, constipation, diarrhea, heartburn, melena, nausea and vomiting.  Genitourinary:  Negative for dysuria, frequency and urgency.  Musculoskeletal:  Negative for back pain and joint pain.  Skin: Negative.  Negative for itching and rash.  Neurological:  Negative for dizziness, tingling, focal weakness, weakness and headaches.  Endo/Heme/Allergies:  Does not bruise/bleed easily.  Psychiatric/Behavioral:  Negative for depression. The patient is not nervous/anxious and does not have insomnia.     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 Excised: 04/21/2010   Pre-diabetes    Wears contact lenses     SURGICAL HISTORY: Past Surgical History:  Procedure Laterality Date   BREAST BIOPSY Right 1987, 2013   fibroadenoma x 2   BREAST BIOPSY Right 04/06/2021   u/s bx-"venus" clip-path pendning   BREAST EXCISIONAL BIOPSY Left 1978   BREAST FIBROADENOMA SURGERY     BREAST LUMPECTOMY WITH SENTINEL LYMPH NODE BIOPSY Right 05/08/2021   Procedure: BREAST LUMPECTOMY WITH SENTINEL LYMPH NODE BX;  Surgeon: Robert Bellow, MD;  Location: [facility] ORS;  Service: General;  Laterality: Right;   CARPAL TUNNEL RELEASE Right 08/28/2017   Procedure: CARPAL TUNNEL RELEASE ENDOSCOPIC;  Surgeon: Corky Mull, MD;  Location: [place];  Service: Orthopedics;  Laterality: Right;   CARPAL TUNNEL RELEASE Left 11/06/2017   Procedure: CARPAL TUNNEL RELEASE ENDOSCOPIC;  Surgeon: Corky Mull, MD;   Location: [place];  Service: Orthopedics;  Laterality: Left;   COLONOSCOPY N/A 09/26/2015   Procedure: COLONOSCOPY;  Surgeon: Manya Silvas, MD;  Location: [facility] END[facility]COPY;  Service: Endoscopy;  Laterality: N/A;   TOOTH EXTRACTION      SOCIAL HISTORY: Social History   Socioeconomic History   Marital status: Single    Spouse name: Not on file   Number of children: Not on file   Years of education: Not on file   Highest education level: Not on file  Occupational History   Not on file  Tobacco Use   Smoking status: Never   Smokeless tobacco: Never  Vaping Use   Vaping Use: Never used  Substance and Sexual Activity   Alcohol use: Yes    Alcohol/week: 0.0 standard drinks    Comment: may have a drink 3-4x/yr   Drug use: No   Sexual activity: Not on file  Other Topics Concern   Not  on file  Social History Narrative   Not on file   Social Determinants of Health   Financial Resource Strain: Not on file  Food Insecurity: Not on file  Transportation Needs: Not on file  Physical Activity: Not on file  Stress: Not on file  Social Connections: Not on file  Intimate Partner Violence: Not on file    FAMILY HISTORY: Family History  Problem Relation Age of Onset   Colon cancer Mother        dx 46s   Other Brother        CLL and skin cancers   Breast cancer Maternal Aunt 60   Lung cancer Maternal Aunt    Breast cancer Paternal Aunt 60   Pancreatic cancer Paternal Aunt    Melanoma Maternal Uncle    Breast cancer Niece        dx 2s, "BRCA pos"    ALLERGIES:  is allergic to wound dressing adhesive and meloxicam.  MEDICATIONS:  Current Outpatient Medications  Medication Sig Dispense Refill   Calcium Carb-Cholecalciferol (CALCIUM 600 + D PO) Take 1 tablet by mouth 2 (two) times daily.     CO-ENZYME Q-10 PO Take 100 mg by mouth daily.     fexofenadine (ALLEGRA) 60 MG tablet Take 60 mg by mouth daily.     Glucosamine-Chondroitin (MOVE FREE PO) Take 1 tablet by  mouth daily.     ibuprofen (ADVIL) 200 MG tablet Take 400 mg by mouth every 6 (six) hours as needed for mild pain.     Krill Oil 350 MG CAPS Take 350 mg by mouth daily.     Multiple Vitamin (MULTIVITAMIN) capsule Take 1 capsule by mouth daily. Centrum Silver     Pamabrom 50 MG CAPS Take 50 mg by mouth daily as needed (water gain).     Phenylephrine-Acetaminophen (TYLENOL SINUS+HEADACHE) 5-325 MG TABS Take 1 tablet by mouth daily as needed (Headache).     Polyvinyl Alcohol-Povidone (CLEAR EYES ALL SEASONS) 5-6 MG/ML SOLN Place 1 drop into both eyes daily as needed (Dry eyes).     Probiotic Product (PROBIOTIC-10 PO) Take 1 tablet by mouth daily.     rosuvastatin (CRESTOR) 5 MG tablet Take 5 mg by mouth daily.     No current facility-administered medications for this visit.      Marland Kitchen  PHYSICAL EXAMINATION: ECOG PERFORMANCE STATUS: 0 - Asymptomatic  Vitals:   07/05/21 1526  BP: 128/73  Pulse: 65  Resp: 18  Temp: 97.6 F (36.4 C)  SpO2: 97%   Filed Weights   07/05/21 1524  Weight: 191 lb (86.6 kg)    Physical Exam Vitals and nursing note reviewed.  Constitutional:      Comments: Ambulating: Independently;  With family-husband.  HENT:     Head: Normocephalic and atraumatic.     Mouth/Throat:     Pharynx: Oropharynx is clear.  Eyes:     Extraocular Movements: Extraocular movements intact.     Pupils: Pupils are equal, round, and reactive to light.  Cardiovascular:     Rate and Rhythm: Normal rate and regular rhythm.  Pulmonary:     Comments: Decreased breath sounds bilaterally.  Abdominal:     Palpations: Abdomen is soft.  Musculoskeletal:        General: Normal range of motion.     Cervical back: Normal range of motion.  Skin:    General: Skin is warm.  Neurological:     General: No focal deficit present.     Mental  Status: She is alert and oriented to person, place, and time.  Psychiatric:        Behavior: Behavior normal.        Judgment: Judgment normal.      LABORATORY DATA:  I have reviewed the data as listed Lab Results  Component Value Date   WBC 6.4 04/18/2021   HGB 13.8 04/18/2021   HCT 40.5 04/18/2021   MCV 88.4 04/18/2021   PLT 242 04/18/2021   Recent Labs    04/18/21 1459  NA 137  K 4.1  CL 102  CO2 26  GLUCOSE 102*  BUN 12  CREATININE 0.66  CALCIUM 9.2  GFRNONAA >60  PROT 7.5  ALBUMIN 4.7  AST 31  ALT 30  ALKPHOS 50  BILITOT 0.7    RADIOGRAPHIC STUDIES: I have personally reviewed the radiological images as listed and agreed with the findings in the report. No results found.  ASSESSMENT & PLAN:   Carcinoma of upper-inner quadrant of right breast in female, estrogen receptor positive ([place]) #Stage I T1c- ER positive; PR negative; her 2 negative; s/p lumpectomy clear margins. 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  No significant benefit from chemotherapy.  #I reviewed the Oncotype in detail.  Continue radiation until 10/10.  # #Discussed the mechanism of action of aromatase inhibitors-with blocking of estrogen to prevent breast cancer.  Also discussed the potential side effects including but not limited to arthralgias hot flashes and increased risk of osteoporosis.  #Osteoporosis screening: Last bone density 2018; recommend bone density prior to next visit.  This will have to be ordered by PCP/given previous bone densities.  # COVID vaccines/Booster; wait to finish for RT;   # DISPOSITION: # Follow up in 2 months-MD; no labs; BMD prior--Dr.B  All questions were answered. The patient/family knows to call the clinic with any problems, questions or concerns.    Cammie Sickle, MD 07/05/2021 7:35 PM

## 2021-07-05 NOTE — Telephone Encounter (Signed)
Msg from scheduler  per LOS pt needs a bone density, I called [facility] and they said since her last one was done at [facility] she would need to do it there in order to see the trend line. do you want her to do one here or just stick with [facility]?

## 2021-07-06 ENCOUNTER — Ambulatory Visit
Admission: RE | Admit: 2021-07-06 | Discharge: 2021-07-06 | Disposition: A | Discharge status: Home / Self Care | Payer: Federal, State, Local not specified - PPO | Source: Ambulatory Visit | Attending: Radiation Oncology | Admitting: Radiation Oncology

## 2021-07-06 DIAGNOSIS — Z510 Encounter for antineoplastic radiation therapy: Secondary | ICD-10-CM | POA: Diagnosis not present

## 2021-07-07 ENCOUNTER — Ambulatory Visit
Admission: RE | Admit: 2021-07-07 | Discharge: 2021-07-07 | Disposition: A | Discharge status: Home / Self Care | Payer: Federal, State, Local not specified - PPO | Source: Ambulatory Visit | Attending: Radiation Oncology | Admitting: Radiation Oncology

## 2021-07-07 DIAGNOSIS — Z510 Encounter for antineoplastic radiation therapy: Secondary | ICD-10-CM | POA: Diagnosis not present

## 2021-07-10 ENCOUNTER — Telehealth: Payer: Self-pay

## 2021-07-10 ENCOUNTER — Ambulatory Visit
Admission: RE | Admit: 2021-07-10 | Discharge: 2021-07-10 | Disposition: A | Discharge status: Home / Self Care | Payer: Federal, State, Local not specified - PPO | Source: Ambulatory Visit | Attending: Radiation Oncology | Admitting: Radiation Oncology

## 2021-07-10 DIAGNOSIS — Z510 Encounter for antineoplastic radiation therapy: Secondary | ICD-10-CM | POA: Diagnosis not present

## 2021-07-10 NOTE — Telephone Encounter (Signed)
-----   Message from Brendolyn Patty, MD sent at 07/10/2021  8:37 AM EDT ----- Skin , left plantar foot 1st webspace ACQUIRED DIGITAL FIBROKERATOMA  Benign - please call patient

## 2021-07-10 NOTE — Telephone Encounter (Signed)
Advised pt of bx results/sh ?

## 2021-07-11 ENCOUNTER — Ambulatory Visit
Admission: RE | Admit: 2021-07-11 | Discharge: 2021-07-11 | Disposition: A | Discharge status: Home / Self Care | Payer: Federal, State, Local not specified - PPO | Source: Ambulatory Visit | Attending: Radiation Oncology | Admitting: Radiation Oncology

## 2021-07-11 DIAGNOSIS — Z510 Encounter for antineoplastic radiation therapy: Secondary | ICD-10-CM | POA: Diagnosis not present

## 2021-07-12 ENCOUNTER — Ambulatory Visit
Admission: RE | Admit: 2021-07-12 | Discharge: 2021-07-12 | Disposition: A | Discharge status: Home / Self Care | Payer: Federal, State, Local not specified - PPO | Source: Ambulatory Visit | Attending: Radiation Oncology | Admitting: Radiation Oncology

## 2021-07-12 DIAGNOSIS — Z510 Encounter for antineoplastic radiation therapy: Secondary | ICD-10-CM | POA: Diagnosis not present

## 2021-07-13 ENCOUNTER — Ambulatory Visit
Admission: RE | Admit: 2021-07-13 | Discharge: 2021-07-13 | Disposition: A | Discharge status: Home / Self Care | Payer: Federal, State, Local not specified - PPO | Source: Ambulatory Visit | Attending: Radiation Oncology | Admitting: Radiation Oncology

## 2021-07-13 DIAGNOSIS — Z510 Encounter for antineoplastic radiation therapy: Secondary | ICD-10-CM | POA: Diagnosis not present

## 2021-07-14 ENCOUNTER — Ambulatory Visit
Admission: RE | Admit: 2021-07-14 | Discharge: 2021-07-14 | Disposition: A | Discharge status: Home / Self Care | Payer: Federal, State, Local not specified - PPO | Source: Ambulatory Visit | Attending: Radiation Oncology | Admitting: Radiation Oncology

## 2021-07-14 DIAGNOSIS — Z510 Encounter for antineoplastic radiation therapy: Secondary | ICD-10-CM | POA: Diagnosis not present

## 2021-07-17 ENCOUNTER — Ambulatory Visit
Admission: RE | Admit: 2021-07-17 | Discharge: 2021-07-17 | Disposition: A | Discharge status: Home / Self Care | Payer: Federal, State, Local not specified - PPO | Source: Ambulatory Visit | Attending: Radiation Oncology | Admitting: Radiation Oncology

## 2021-07-17 ENCOUNTER — Inpatient Hospital Stay: Payer: Federal, State, Local not specified - PPO

## 2021-07-17 ENCOUNTER — Other Ambulatory Visit: Payer: Self-pay

## 2021-07-17 DIAGNOSIS — C50211 Malignant neoplasm of upper-inner quadrant of right female breast: Secondary | ICD-10-CM

## 2021-07-17 DIAGNOSIS — Z510 Encounter for antineoplastic radiation therapy: Secondary | ICD-10-CM | POA: Diagnosis not present

## 2021-07-17 DIAGNOSIS — Z170 Estrogen receptor positive status [ER+]: Secondary | ICD-10-CM

## 2021-07-17 LAB — CBC
HCT: 40.4 % (ref 36.0–46.0)
Hemoglobin: 13.6 g/dL (ref 12.0–15.0)
MCH: 29.5 pg (ref 26.0–34.0)
MCHC: 33.7 g/dL (ref 30.0–36.0)
MCV: 87.6 fL (ref 80.0–100.0)
Platelets: 208 10*3/uL (ref 150–400)
RBC: 4.61 MIL/uL (ref 3.87–5.11)
RDW: 11.6 % (ref 11.5–15.5)
WBC: 5 10*3/uL (ref 4.0–10.5)
nRBC: 0 % (ref 0.0–0.2)

## 2021-07-18 ENCOUNTER — Ambulatory Visit
Admission: RE | Admit: 2021-07-18 | Discharge: 2021-07-18 | Disposition: A | Discharge status: Home / Self Care | Payer: Federal, State, Local not specified - PPO | Source: Ambulatory Visit | Attending: Radiation Oncology | Admitting: Radiation Oncology

## 2021-07-18 DIAGNOSIS — Z510 Encounter for antineoplastic radiation therapy: Secondary | ICD-10-CM | POA: Diagnosis not present

## 2021-07-19 ENCOUNTER — Ambulatory Visit
Admission: RE | Admit: 2021-07-19 | Discharge: 2021-07-19 | Disposition: A | Discharge status: Home / Self Care | Payer: Federal, State, Local not specified - PPO | Source: Ambulatory Visit | Attending: Radiation Oncology | Admitting: Radiation Oncology

## 2021-07-19 DIAGNOSIS — Z510 Encounter for antineoplastic radiation therapy: Secondary | ICD-10-CM | POA: Diagnosis not present

## 2021-07-20 ENCOUNTER — Ambulatory Visit
Admission: RE | Admit: 2021-07-20 | Discharge: 2021-07-20 | Disposition: A | Discharge status: Home / Self Care | Payer: Federal, State, Local not specified - PPO | Source: Ambulatory Visit | Attending: Radiation Oncology | Admitting: Radiation Oncology

## 2021-07-20 DIAGNOSIS — Z510 Encounter for antineoplastic radiation therapy: Secondary | ICD-10-CM | POA: Diagnosis not present

## 2021-07-21 ENCOUNTER — Ambulatory Visit
Admission: RE | Admit: 2021-07-21 | Discharge: 2021-07-21 | Disposition: A | Discharge status: Home / Self Care | Payer: Federal, State, Local not specified - PPO | Source: Ambulatory Visit | Attending: Radiation Oncology | Admitting: Radiation Oncology

## 2021-07-21 DIAGNOSIS — Z510 Encounter for antineoplastic radiation therapy: Secondary | ICD-10-CM | POA: Diagnosis not present

## 2021-07-24 ENCOUNTER — Ambulatory Visit
Admission: RE | Admit: 2021-07-24 | Discharge: 2021-07-24 | Disposition: A | Discharge status: Home / Self Care | Payer: Federal, State, Local not specified - PPO | Source: Ambulatory Visit | Attending: Radiation Oncology | Admitting: Radiation Oncology

## 2021-07-24 DIAGNOSIS — C50211 Malignant neoplasm of upper-inner quadrant of right female breast: Secondary | ICD-10-CM | POA: Insufficient documentation

## 2021-07-24 DIAGNOSIS — Z510 Encounter for antineoplastic radiation therapy: Secondary | ICD-10-CM | POA: Insufficient documentation

## 2021-07-25 ENCOUNTER — Ambulatory Visit
Admission: RE | Admit: 2021-07-25 | Discharge: 2021-07-25 | Disposition: A | Discharge status: Home / Self Care | Payer: Federal, State, Local not specified - PPO | Source: Ambulatory Visit | Attending: Radiation Oncology | Admitting: Radiation Oncology

## 2021-07-25 DIAGNOSIS — Z510 Encounter for antineoplastic radiation therapy: Secondary | ICD-10-CM | POA: Diagnosis not present

## 2021-07-26 ENCOUNTER — Ambulatory Visit
Admission: RE | Admit: 2021-07-26 | Discharge: 2021-07-26 | Disposition: A | Discharge status: Home / Self Care | Payer: Federal, State, Local not specified - PPO | Source: Ambulatory Visit | Attending: Radiation Oncology | Admitting: Radiation Oncology

## 2021-07-26 DIAGNOSIS — Z510 Encounter for antineoplastic radiation therapy: Secondary | ICD-10-CM | POA: Diagnosis not present

## 2021-07-27 ENCOUNTER — Ambulatory Visit
Admission: RE | Admit: 2021-07-27 | Discharge: 2021-07-27 | Disposition: A | Discharge status: Home / Self Care | Payer: Federal, State, Local not specified - PPO | Source: Ambulatory Visit | Attending: Radiation Oncology | Admitting: Radiation Oncology

## 2021-07-27 DIAGNOSIS — Z510 Encounter for antineoplastic radiation therapy: Secondary | ICD-10-CM | POA: Diagnosis not present

## 2021-07-28 ENCOUNTER — Ambulatory Visit
Admission: RE | Admit: 2021-07-28 | Discharge: 2021-07-28 | Disposition: A | Discharge status: Home / Self Care | Payer: Federal, State, Local not specified - PPO | Source: Ambulatory Visit | Attending: Radiation Oncology | Admitting: Radiation Oncology

## 2021-07-28 DIAGNOSIS — Z510 Encounter for antineoplastic radiation therapy: Secondary | ICD-10-CM | POA: Diagnosis not present

## 2021-07-31 ENCOUNTER — Ambulatory Visit
Admission: RE | Admit: 2021-07-31 | Discharge: 2021-07-31 | Disposition: A | Discharge status: Home / Self Care | Payer: Federal, State, Local not specified - PPO | Source: Ambulatory Visit | Attending: Radiation Oncology | Admitting: Radiation Oncology

## 2021-07-31 DIAGNOSIS — Z510 Encounter for antineoplastic radiation therapy: Secondary | ICD-10-CM | POA: Diagnosis not present

## 2021-09-04 ENCOUNTER — Ambulatory Visit
Admission: RE | Admit: 2021-09-04 | Discharge: 2021-09-04 | Disposition: A | Discharge status: Home / Self Care | Payer: Federal, State, Local not specified - PPO | Source: Ambulatory Visit | Attending: Radiation Oncology | Admitting: Radiation Oncology

## 2021-09-04 ENCOUNTER — Encounter: Payer: Self-pay | Admitting: Internal Medicine

## 2021-09-04 ENCOUNTER — Other Ambulatory Visit: Payer: Self-pay

## 2021-09-04 ENCOUNTER — Inpatient Hospital Stay: Payer: Federal, State, Local not specified - PPO | Attending: Internal Medicine | Admitting: Internal Medicine

## 2021-09-04 ENCOUNTER — Encounter: Payer: Self-pay | Admitting: Radiation Oncology

## 2021-09-04 VITALS — BP 133/75 | HR 78 | Temp 97.3°F | Wt 187.6 lb

## 2021-09-04 DIAGNOSIS — Z170 Estrogen receptor positive status [ER+]: Secondary | ICD-10-CM | POA: Insufficient documentation

## 2021-09-04 DIAGNOSIS — Z923 Personal history of irradiation: Secondary | ICD-10-CM | POA: Insufficient documentation

## 2021-09-04 DIAGNOSIS — C50211 Malignant neoplasm of upper-inner quadrant of right female breast: Secondary | ICD-10-CM

## 2021-09-04 MED ORDER — ANASTROZOLE 1 MG PO TABS: 1.0000 mg | ORAL_TABLET | Freq: Every day | ORAL | 4 refills | Status: DC

## 2021-09-04 NOTE — Progress Notes (Signed)
one [place] NOTE  Patient Care Team: Marinda Elk, MD as PCP - General (Physician Assistant) Theodore Demark, RN as Oncology Nurse Navigator Noreene Filbert, MD as Consulting Physician (Radiation Oncology) Cammie Sickle, MD as Consulting Physician (Internal Medicine) Robert Bellow, MD as Consulting Physician (General Surgery)  CHIEF COMPLAINTS/PURPOSE OF CONSULTATION: Breast cancer    Oncology History Overview Note  # June 2022- Targeted ultrasound was performed of the RIGHT upper inner breast. At 1:30 8 cm from the nipple, there is an irregular hypoechoic mass with indistinct margins. It measures approximately 10 x 5 x 6 mm. This corresponds to the site of screening mammographic concern.   Targeted ultrasound was performed of the RIGHT axilla. No suspicious axillary lymph nodes are seen.   IMPRESSION: 1. There is a 10 mm mass in the RIGHT upper inner breast which is concerning for malignancy. Recommend ultrasound-guided biopsy for definitive characterization.  DIAGNOSIS:  A. BREAST, RIGHT, 1:30 O'CLOCK 8 CM FROM NIPPLE; ULTRASOUND-GUIDED CORE  BIOPSY:  - INVASIVE MAMMARY CARCINOMA, NO SPECIAL TYPE.   Size of invasive carcinoma: 7 mm in this sample  Histologic grade of invasive carcinoma: Grade 2                       Glandular/tubular differentiation score: 3                       Nuclear pleomorphism score: 2                       Mitotic rate score: 1                       Total score: 6  Ductal carcinoma in situ: Present, intermediate grade  Lymphovascular invasion: Not identified   CASE SUMMARY: BREAST BIOMARKER TESTS  Estrogen Receptor (ER) Status: POSITIVE          Percentage of cells with nuclear positivity: Greater than 90%          Average intensity of staining: Strong   Progesterone Receptor (PgR) Status: NEGATIVE (less than 1%)          Internal control cells present and stain as expected   HER2 (by  immunohistochemistry): NEGATIVE (Score 0)  TNM Descriptors: Not applicable  ER1V  Regional Lymph Nodes Modifier: Not applicable  pN0  pM - Not applicable  #  4008- melanoma in situ s/p resection.   #Right breast cancer status postlumpectomy-Oncotype low risk; no chemotherapy.  # NOV 14th, 2022-start anastrozole for 5 years.   Carcinoma of upper-inner quadrant of right breast in female, estrogen receptor positive ([place])  04/18/2021 Initial Diagnosis   Carcinoma of upper-inner quadrant of right breast in female, estrogen receptor positive ([place])    Genetic Testing   Negative genetic testing. No pathogenic variants identified on the [facility] CancerNext-Expanded+RNA Panel. The report date is 04/30/2021.   The CancerNext-Expanded + RNAinsight gene panel offered by [employer] and includes sequencing and rearrangement analysis for the following 77 genes: IP, ALK, APC*, ATM*, AXIN2, BAP1, BARD1, BLM, BMPR1A, BRCA1*, BRCA2*, BRIP1*, CDC73, CDH1*,CDK4, CDKN1B, CDKN2A, CHEK2*, CTNNA1, DICER1, FANCC, FH, FLCN, GALNT12, KIF1B, LZTR1, MAX, MEN1, MET, MLH1*, MSH2*, MSH3, MSH6*, MUTYH*, NBN, NF1*, NF2, NTHL1, PALB2*, PHOX2B, PMS2*, POT1, PRKAR1A, PTCH1, PTEN*, RAD51C*, RAD51D*,RB1, RECQL, RET, SDHA, SDHAF2, SDHB, SDHC, SDHD, SMAD4, SMARCA4, SMARCB1, SMARCE1, STK11, SUFU, TMEM127, TP53*,TSC1, TSC2, VHL and XRCC2 (sequencing and deletion/duplication); EGFR,  EGLN1, HOXB13, KIT, MITF, PDGFRA, POLD1 and POLE (sequencing only); EPCAM and GREM1 (deletion/duplication only).   05/25/2021 Cancer Staging   Staging form: Breast, AJCC 8th Edition - Pathologic: Stage IA (pT1c, pN0, cM0, G2, ER+, PR-, HER2-) - Signed by Cammie Sickle, MD on 05/25/2021 Histologic grading system: 3 grade system        HISTORY OF PRESENTING ILLNESS:  Meghan Harper 65 y.o.  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  Patient finished radiation approximately a month ago.   Tolerated well.  No major side effects.  Patient has a history of hot flashes mild.  Not any worse.   Review of Systems  Constitutional:  Negative for chills, diaphoresis, fever, malaise/fatigue and weight loss.  HENT:  Negative for nosebleeds and sore throat.   Eyes:  Negative for double vision.  Respiratory:  Negative for cough, hemoptysis, sputum production, shortness of breath and wheezing.   Cardiovascular:  Negative for chest pain, palpitations, orthopnea and leg swelling.  Gastrointestinal:  Negative for abdominal pain, blood in stool, constipation, diarrhea, heartburn, melena, nausea and vomiting.  Genitourinary:  Negative for dysuria, frequency and urgency.  Musculoskeletal:  Negative for back pain and joint pain.  Skin: Negative.  Negative for itching and rash.  Neurological:  Negative for dizziness, tingling, focal weakness, weakness and headaches.  Endo/Heme/Allergies:  Does not bruise/bleed easily.  Psychiatric/Behavioral:  Negative for depression. The patient is not nervous/anxious and does not have insomnia.     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 Excised: 04/21/2010   Pre-diabetes    Wears contact lenses     SURGICAL HISTORY: Past Surgical History:  Procedure Laterality Date   BREAST BIOPSY Right 1987, 2013   fibroadenoma x 2   BREAST BIOPSY Right 04/06/2021   u/s bx-"venus" clip-path pendning   BREAST EXCISIONAL BIOPSY Left 1978   BREAST FIBROADENOMA SURGERY     BREAST LUMPECTOMY WITH SENTINEL LYMPH NODE BIOPSY Right 05/08/2021   Procedure: BREAST LUMPECTOMY WITH SENTINEL LYMPH NODE BX;  Surgeon: Robert Bellow, MD;  Location: [facility] ORS;  Service: General;   Laterality: Right;   CARPAL TUNNEL RELEASE Right 08/28/2017   Procedure: CARPAL TUNNEL RELEASE ENDOSCOPIC;  Surgeon: Corky Mull, MD;  Location: [place];  Service: Orthopedics;  Laterality: Right;   CARPAL TUNNEL RELEASE Left 11/06/2017   Procedure: CARPAL TUNNEL RELEASE ENDOSCOPIC;  Surgeon: Corky Mull, MD;  Location: [place];  Service: Orthopedics;  Laterality: Left;   COLONOSCOPY N/A 09/26/2015   Procedure: COLONOSCOPY;  Surgeon: Manya Silvas, MD;  Location: [facility] ENDOSCOPY;  Service: Endoscopy;  Laterality: N/A;   TOOTH EXTRACTION      SOCIAL HISTORY: Social History   Socioeconomic History   Marital status: Single    Spouse name: Not on file   Number of children: Not on file   Years of education: Not on file   Highest education level: Not on file  Occupational History   Not on file  Tobacco Use   Smoking status: Never   Smokeless tobacco: Never  Vaping  Use   Vaping Use: Never used  Substance and Sexual Activity   Alcohol use: Yes    Alcohol/week: 0.0 standard drinks    Comment: may have a drink 3-4x/yr   Drug use: No   Sexual activity: Not on file  Other Topics Concern   Not on file  Social History Narrative   Not on file   Social Determinants of Health   Financial Resource Strain: Not on file  Food Insecurity: Not on file  Transportation Needs: Not on file  Physical Activity: Not on file  Stress: Not on file  Social Connections: Not on file  Intimate Partner Violence: Not on file    FAMILY HISTORY: Family History  Problem Relation Age of Onset   Colon cancer Mother        dx 25s   Other Brother        CLL and skin cancers   Breast cancer Maternal Aunt 60   Lung cancer Maternal Aunt    Breast cancer Paternal Aunt 60   Pancreatic cancer Paternal Aunt    Melanoma Maternal Uncle    Breast cancer Niece        dx 54s, "BRCA pos"    ALLERGIES:  is allergic to wound dressing adhesive and meloxicam.  MEDICATIONS:  Current  Outpatient Medications  Medication Sig Dispense Refill   anastrozole (ARIMIDEX) 1 MG tablet Take 1 tablet (1 mg total) by mouth daily. 30 tablet 4   Calcium Carb-Cholecalciferol (CALCIUM 600 + D PO) Take 1 tablet by mouth 2 (two) times daily.     CO-ENZYME Q-10 PO Take 100 mg by mouth daily.     fexofenadine (ALLEGRA) 60 MG tablet Take 60 mg by mouth daily.     Glucosamine-Chondroitin (MOVE FREE PO) Take 1 tablet by mouth daily.     ibuprofen (ADVIL) 200 MG tablet Take 400 mg by mouth every 6 (six) hours as needed for mild pain.     Krill Oil 350 MG CAPS Take 350 mg by mouth daily.     Melatonin 10 MG TABS Take 1 tablet by mouth at bedtime as needed.     Multiple Vitamin (MULTIVITAMIN) capsule Take 1 capsule by mouth daily. Centrum Silver     Pamabrom 50 MG CAPS Take 50 mg by mouth daily as needed (water gain).     Phenylephrine-Acetaminophen (TYLENOL SINUS+HEADACHE) 5-325 MG TABS Take 1 tablet by mouth daily as needed (Headache).     Polyvinyl Alcohol-Povidone (CLEAR EYES ALL SEASONS) 5-6 MG/ML SOLN Place 1 drop into both eyes daily as needed (Dry eyes).     Probiotic Product (PROBIOTIC-10 PO) Take 1 tablet by mouth daily.     rosuvastatin (CRESTOR) 5 MG tablet Take 5 mg by mouth daily.     No current facility-administered medications for this visit.      Marland Kitchen  PHYSICAL EXAMINATION: ECOG PERFORMANCE STATUS: 0 - Asymptomatic  Vitals:   09/04/21 1450  BP: 133/75  Pulse: 78  Temp: (!) 97.3 F (36.3 C)   Filed Weights   09/04/21 1450  Weight: 187 lb 9.6 oz (85.1 kg)    Physical Exam Vitals and nursing note reviewed.  Constitutional:      Comments: Ambulating: Independently;  With family-husband.  HENT:     Head: Normocephalic and atraumatic.     Mouth/Throat:     Pharynx: Oropharynx is clear.  Eyes:     Extraocular Movements: Extraocular movements intact.     Pupils: Pupils are equal, round, and reactive to  light.  Cardiovascular:     Rate and Rhythm: Normal rate and  regular rhythm.  Pulmonary:     Comments: Decreased breath sounds bilaterally.  Abdominal:     Palpations: Abdomen is soft.  Musculoskeletal:        General: Normal range of motion.     Cervical back: Normal range of motion.  Skin:    General: Skin is warm.  Neurological:     General: No focal deficit present.     Mental Status: She is alert and oriented to person, place, and time.  Psychiatric:        Behavior: Behavior normal.        Judgment: Judgment normal.     LABORATORY DATA:  I have reviewed the data as listed Lab Results  Component Value Date   WBC 5.0 07/17/2021   HGB 13.6 07/17/2021   HCT 40.4 07/17/2021   MCV 87.6 07/17/2021   PLT 208 07/17/2021   Recent Labs    04/18/21 1459  NA 137  K 4.1  CL 102  CO2 26  GLUCOSE 102*  BUN 12  CREATININE 0.66  CALCIUM 9.2  GFRNONAA >60  PROT 7.5  ALBUMIN 4.7  AST 31  ALT 30  ALKPHOS 50  BILITOT 0.7    RADIOGRAPHIC STUDIES: I have personally reviewed the radiological images as listed and agreed with the findings in the report. No results found.  ASSESSMENT & PLAN:   Carcinoma of upper-inner quadrant of right breast in female, estrogen receptor positive ([place]) #Stage I T1c- ER positive; PR negative; her 2 negative; s/p lumpectomy clear margins. 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  No significant benefit from chemotherapy.  #I reviewed the Oncotype in detail.  Continue radiation until 10/10.  # #Discussed the mechanism of action of aromatase inhibitors-with blocking of estrogen to prevent breast cancer.  Also discussed the potential side effects including but not limited to arthralgias hot flashes and increased risk of osteoporosis.  #Osteoporosis screening: Last bone density 2018; recommend bone density prior to next visit.  This will have to be ordered by PCP/given previous bone densities.  # COVID vaccines/Booster; wait to finish for RT;    # DISPOSITION: # Follow up in 2 months-MD; no labs; BMD prior--Dr.B  All questions were answered. The patient/family knows to call the clinic with any problems, questions or concerns.    Cammie Sickle, MD 09/04/2021 3:37 PM

## 2021-09-04 NOTE — Assessment & Plan Note (Addendum)
#  Stage I T1c- ER positive; PR negative; her 2 negative; s/p lumpectomy clear margins. 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  No significant benefit from chemotherapy.s/p RT -10/10.  # Proceed with Anastrzole one day. Again discussed the mechanism of action of aromatase inhibitors-with blocking of estrogen to prevent breast cancer.  Also discussed the potential side effects including but not limited to arthralgias hot flashes and increased risk of osteoporosis.  #Osteoporosis screening: Last bone density 2018; PCP/given previous bone densities awaiting results- done at [facility].  Awaiting results.  # DISPOSITION: # Follow up in 2 months-MD; no labs;-Dr.B

## 2021-09-04 NOTE — Progress Notes (Signed)
Radiation Oncology Follow up Note  Name: Meghan Harper   Date:   09/04/2021 MRN:  [id number] DOB: October 20, 1956    This 65 y.o. 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  REFERRING PROVIDER: Marinda Elk, MD  HPI: Patient is a 65 year old female now about 1 month for breast radiation to her right breast for stage Ia ER positive invasive mammary carcinoma.  Seen today in routine follow-up she is doing well specifically denies breast tenderness cough or bone pain.  She is seeing Dr. B today for possible recommendation of an aromatase inhibitor..  COMPLICATIONS OF TREATMENT: none  FOLLOW UP COMPLIANCE: keeps appointments   PHYSICAL EXAM:  BP 133/75   Pulse 78   Temp (!) 97.3 F (36.3 C) (Tympanic)   Wt 187 lb 9.6 oz (85.1 kg)   BMI 32.71 kg/m  Lungs are clear to A&P cardiac examination essentially unremarkable with regular rate and rhythm. No dominant mass or nodularity is noted in either breast in 2 positions examined. Incision is well-healed. No axillary or supraclavicular adenopathy is appreciated. Cosmetic result is excellent.  Well-developed well-nourished patient in NAD. HEENT reveals PERLA, EOMI, discs not visualized.  Oral cavity is clear. No oral mucosal lesions are identified. Neck is clear without evidence of cervical or supraclavicular adenopathy. Lungs are clear to A&P. Cardiac examination is essentially unremarkable with regular rate and rhythm without murmur rub or thrill. Abdomen is benign with no organomegaly or masses noted. Motor sensory and DTR levels are equal and symmetric in the upper and lower extremities. Cranial nerves II through XII are grossly intact. Proprioception is intact. No peripheral adenopathy or edema is identified. No motor or sensory levels are noted. Crude visual fields are within normal range.  RADIOLOGY RESULTS: No current films for review  PLAN: Present time patient is doing well 1  month out from whole breast radiation and pleased with her overall progress.  Of asked to see her back in 4 to 5 months for follow-up.  Patient i knows to call with any concerns.  I would like to take this opportunity to thank you for allowing me to participate in the care of your patient.Noreene Filbert, MD

## 2021-09-04 NOTE — Progress Notes (Signed)
Patient had bone density on 08/29/2021 at [facility].

## 2021-11-06 ENCOUNTER — Other Ambulatory Visit: Payer: Self-pay

## 2021-11-06 ENCOUNTER — Encounter: Payer: Self-pay | Admitting: Internal Medicine

## 2021-11-06 ENCOUNTER — Inpatient Hospital Stay: Payer: Federal, State, Local not specified - PPO | Attending: Internal Medicine | Admitting: Internal Medicine

## 2021-11-06 DIAGNOSIS — N951 Menopausal and female climacteric states: Secondary | ICD-10-CM | POA: Insufficient documentation

## 2021-11-06 DIAGNOSIS — Z170 Estrogen receptor positive status [ER+]: Secondary | ICD-10-CM | POA: Diagnosis not present

## 2021-11-06 DIAGNOSIS — C50211 Malignant neoplasm of upper-inner quadrant of right female breast: Secondary | ICD-10-CM | POA: Insufficient documentation

## 2021-11-06 DIAGNOSIS — Z79811 Long term (current) use of aromatase inhibitors: Secondary | ICD-10-CM | POA: Diagnosis not present

## 2021-11-06 MED ORDER — ANASTROZOLE 1 MG PO TABS: 1.0000 mg | ORAL_TABLET | Freq: Every day | ORAL | 1 refills | Status: DC

## 2021-11-06 NOTE — Progress Notes (Signed)
November 2022 bone density t score- -1.4 pivot osteopenia. Reviewed the results for the patient. Discussed that we will review the options further at next visit. Continue Calcium plus vitamin D as recommended by PCP at this time. Patient in agreement.

## 2021-11-06 NOTE — Assessment & Plan Note (Addendum)
#  Stage I T1c- ER positive; PR negative; her 2 negative; s/p lumpectomy clear margins. ONCOTYPE-LOW RISK- s/p RT.   # ON anastrazole- tolerating well except for hot flashes- se below.  #Hot flashes grade 1-recommend monitor for now.  If worse would recommend Celexa.  #Osteoporosis screening: BMD- NOV 2022- [[facility]]- awaiting report from PCPs office.  Agree with calcium plus vitamin D.  # DISPOSITION: # Follow up in 3 months-MD; no labs;-Dr.B

## 2021-11-06 NOTE — Progress Notes (Signed)
one [place] NOTE  Patient Care Team: Marinda Elk, MD as PCP - General (Physician Assistant) Theodore Demark, RN as Oncology Nurse Navigator Noreene Filbert, MD as Consulting Physician (Radiation Oncology) Cammie Sickle, MD as Consulting Physician (Internal Medicine) Robert Bellow, MD as Consulting Physician (General Surgery)  CHIEF COMPLAINTS/PURPOSE OF CONSULTATION: Breast cancer    Oncology History Overview Note  # June 2022- Targeted ultrasound was performed of the RIGHT upper inner breast. At 1:30 8 cm from the nipple, there is an irregular hypoechoic mass with indistinct margins. It measures approximately 10 x 5 x 6 mm. This corresponds to the site of screening mammographic concern.   Targeted ultrasound was performed of the RIGHT axilla. No suspicious axillary lymph nodes are seen.   IMPRESSION: 1. There is a 10 mm mass in the RIGHT upper inner breast which is concerning for malignancy. Recommend ultrasound-guided biopsy for definitive characterization.  DIAGNOSIS:  A. BREAST, RIGHT, 1:30 O'CLOCK 8 CM FROM NIPPLE; ULTRASOUND-GUIDED CORE  BIOPSY:  - INVASIVE MAMMARY CARCINOMA, NO SPECIAL TYPE.   Size of invasive carcinoma: 7 mm in this sample  Histologic grade of invasive carcinoma: Grade 2                       Glandular/tubular differentiation score: 3                       Nuclear pleomorphism score: 2                       Mitotic rate score: 1                       Total score: 6  Ductal carcinoma in situ: Present, intermediate grade  Lymphovascular invasion: Not identified   CASE SUMMARY: BREAST BIOMARKER TESTS  Estrogen Receptor (ER) Status: POSITIVE          Percentage of cells with nuclear positivity: Greater than 90%          Average intensity of staining: Strong   Progesterone Receptor (PgR) Status: NEGATIVE (less than 1%)          Internal control cells present and stain as expected   HER2 (by  immunohistochemistry): NEGATIVE (Score 0)  TNM Descriptors: Not applicable  EV0J  Regional Lymph Nodes Modifier: Not applicable  pN0  pM - Not applicable  #  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   #Right breast cancer status postlumpectomy-Oncotype low risk; no chemotherapy.  # NOV 14th, 2022-start anastrozole for 5 years.   Carcinoma of upper-inner quadrant of right breast in female, estrogen receptor positive ([place])  04/18/2021 Initial Diagnosis   Carcinoma of upper-inner quadrant of right breast in female, estrogen receptor positive ([place])    Genetic Testing   Negative genetic testing. No pathogenic variants identified on the [facility] CancerNext-Expanded+RNA Panel. The report date is 04/30/2021.   The CancerNext-Expanded + RNAinsight gene panel offered by [employer] and includes sequencing and rearrangement analysis for the following 77 genes: IP, ALK, APC*, ATM*, AXIN2, BAP1, BARD1, BLM, BMPR1A, BRCA1*, BRCA2*, BRIP1*, CDC73, CDH1*,CDK4, CDKN1B, CDKN2A, CHEK2*, CTNNA1, DICER1, FANCC, FH, FLCN, GALNT12, KIF1B, LZTR1, MAX, MEN1, MET, MLH1*, MSH2*, MSH3, MSH6*, MUTYH*, NBN, NF1*, NF2, NTHL1, PALB2*, PHOX2B, PMS2*, POT1, PRKAR1A, PTCH1, PTEN*, RAD51C*, RAD51D*,RB1, RECQL, RET, SDHA, SDHAF2, SDHB, SDHC, SDHD, SMAD4, SMARCA4, SMARCB1, SMARCE1, STK11, SUFU, TMEM127, TP53*,TSC1, TSC2, VHL and XRCC2 (sequencing and deletion/duplication); EGFR,  EGLN1, HOXB13, KIT, MITF, PDGFRA, POLD1 and POLE (sequencing only); EPCAM and GREM1 (deletion/duplication only).   05/25/2021 Cancer Staging   Staging form: Breast, AJCC 8th Edition - Pathologic: Stage IA (pT1c, pN0, cM0, G2, ER+, PR-, HER2-) - Signed by Cammie Sickle, MD on 05/25/2021 Histologic grading system: 3 grade system      HISTORY OF PRESENTING ILLNESS: Ambulating independently.  Alone. Meghan Harper 66 y.o.  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  Patient has  a history of hot flashes mild.- worse since starting Anastrazole. No worsening joint pains/ or weight gain.   Review of Systems  Constitutional:  Negative for chills, diaphoresis, fever, malaise/fatigue and weight loss.  HENT:  Negative for nosebleeds and sore throat.   Eyes:  Negative for double vision.  Respiratory:  Negative for cough, hemoptysis, sputum production, shortness of breath and wheezing.   Cardiovascular:  Negative for chest pain, palpitations, orthopnea and leg swelling.  Gastrointestinal:  Negative for abdominal pain, blood in stool, constipation, diarrhea, heartburn, melena, nausea and vomiting.  Genitourinary:  Negative for dysuria, frequency and urgency.  Musculoskeletal:  Negative for back pain and joint pain.  Skin: Negative.  Negative for itching and rash.  Neurological:  Negative for dizziness, tingling, focal weakness, weakness and headaches.  Endo/Heme/Allergies:  Does not bruise/bleed easily.  Psychiatric/Behavioral:  Negative for depression. The patient is not nervous/anxious and does not have insomnia.     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 Excised: 04/21/2010   Pre-diabetes    Wears contact lenses     SURGICAL HISTORY: Past Surgical History:  Procedure Laterality Date   BREAST BIOPSY Right 1987, 2013   fibroadenoma x 2   BREAST BIOPSY Right 04/06/2021   u/s bx-"venus" clip-path pendning   BREAST EXCISIONAL BIOPSY Left 1978   BREAST FIBROADENOMA SURGERY     BREAST LUMPECTOMY WITH SENTINEL LYMPH NODE BIOPSY Right 05/08/2021   Procedure: BREAST LUMPECTOMY WITH SENTINEL LYMPH NODE BX;  Surgeon: Robert Bellow, MD;  Location: [facility] ORS;  Service:  General;  Laterality: Right;   CARPAL TUNNEL RELEASE Right 08/28/2017   Procedure: CARPAL TUNNEL RELEASE ENDOSCOPIC;  Surgeon: Corky Mull, MD;  Location: [place];  Service: Orthopedics;  Laterality: Right;   CARPAL TUNNEL RELEASE Left 11/06/2017   Procedure: CARPAL TUNNEL RELEASE ENDOSCOPIC;  Surgeon: Corky Mull, MD;  Location: [place];  Service: Orthopedics;  Laterality: Left;   COLONOSCOPY N/A 09/26/2015   Procedure: COLONOSCOPY;  Surgeon: Manya Silvas, MD;  Location: [facility] ENDOSCOPY;  Service: Endoscopy;  Laterality: N/A;   TOOTH EXTRACTION      SOCIAL HISTORY: Social History   Socioeconomic History   Marital status: Single    Spouse name: Not on file   Number of children: Not on file   Years of education: Not on file   Highest education level: Not on file  Occupational History   Not on file  Tobacco Use   Smoking status: Never   Smokeless tobacco: Never  Vaping Use   Vaping Use: Never  used  Substance and Sexual Activity   Alcohol use: Yes    Alcohol/week: 0.0 standard drinks    Comment: may have a drink 3-4x/yr   Drug use: No   Sexual activity: Not on file  Other Topics Concern   Not on file  Social History Narrative   Not on file   Social Determinants of Health   Financial Resource Strain: Not on file  Food Insecurity: Not on file  Transportation Needs: Not on file  Physical Activity: Not on file  Stress: Not on file  Social Connections: Not on file  Intimate Partner Violence: Not on file    FAMILY HISTORY: Family History  Problem Relation Age of Onset   Colon cancer Mother        dx 34s   Other Brother        CLL and skin cancers   Breast cancer Maternal Aunt 60   Lung cancer Maternal Aunt    Breast cancer Paternal Aunt 60   Pancreatic cancer Paternal Aunt    Melanoma Maternal Uncle    Breast cancer Niece        dx 62s, "BRCA pos"    ALLERGIES:  is allergic to wound dressing adhesive and meloxicam.  MEDICATIONS:   Current Outpatient Medications  Medication Sig Dispense Refill   Calcium Carb-Cholecalciferol (CALCIUM 600 + D PO) Take 1 tablet by mouth 2 (two) times daily.     CO-ENZYME Q-10 PO Take 100 mg by mouth daily.     fexofenadine (ALLEGRA) 60 MG tablet Take 60 mg by mouth daily.     Glucosamine-Chondroitin (MOVE FREE PO) Take 1 tablet by mouth daily.     ibuprofen (ADVIL) 200 MG tablet Take 400 mg by mouth every 6 (six) hours as needed for mild pain.     Krill Oil 350 MG CAPS Take 350 mg by mouth daily.     Melatonin 10 MG TABS Take 1 tablet by mouth at bedtime as needed.     Multiple Vitamin (MULTIVITAMIN) capsule Take 1 capsule by mouth daily. Centrum Silver     Pamabrom 50 MG CAPS Take 50 mg by mouth daily as needed (water gain).     Phenylephrine-Acetaminophen (TYLENOL SINUS+HEADACHE) 5-325 MG TABS Take 1 tablet by mouth daily as needed (Headache).     Polyvinyl Alcohol-Povidone (CLEAR EYES ALL SEASONS) 5-6 MG/ML SOLN Place 1 drop into both eyes daily as needed (Dry eyes).     rosuvastatin (CRESTOR) 5 MG tablet Take 5 mg by mouth daily.     anastrozole (ARIMIDEX) 1 MG tablet Take 1 tablet (1 mg total) by mouth daily. 90 tablet 1   Probiotic Product (PROBIOTIC-10 PO) Take 1 tablet by mouth daily. (Patient not taking: Reported on 11/06/2021)     No current facility-administered medications for this visit.      Marland Kitchen  PHYSICAL EXAMINATION: ECOG PERFORMANCE STATUS: 0 - Asymptomatic  Vitals:   11/06/21 1512  BP: 137/74  Pulse: 89  Temp: 98.9 F (37.2 C)  SpO2: 96%   Filed Weights   11/06/21 1512  Weight: 187 lb 12.8 oz (85.2 kg)    Physical Exam Vitals and nursing note reviewed.  Constitutional:      Comments: Ambulating: Independently;  With family-husband.  HENT:     Head: Normocephalic and atraumatic.     Mouth/Throat:     Pharynx: Oropharynx is clear.  Eyes:     Extraocular Movements: Extraocular movements intact.     Pupils: Pupils are equal, round, and  reactive to  light.  Cardiovascular:     Rate and Rhythm: Normal rate and regular rhythm.  Pulmonary:     Comments: Decreased breath sounds bilaterally.  Abdominal:     Palpations: Abdomen is soft.  Musculoskeletal:        General: Normal range of motion.     Cervical back: Normal range of motion.  Skin:    General: Skin is warm.  Neurological:     General: No focal deficit present.     Mental Status: She is alert and oriented to person, place, and time.  Psychiatric:        Behavior: Behavior normal.        Judgment: Judgment normal.     LABORATORY DATA:  I have reviewed the data as listed Lab Results  Component Value Date   WBC 5.0 07/17/2021   HGB 13.6 07/17/2021   HCT 40.4 07/17/2021   MCV 87.6 07/17/2021   PLT 208 07/17/2021   Recent Labs    04/18/21 1459  NA 137  K 4.1  CL 102  CO2 26  GLUCOSE 102*  BUN 12  CREATININE 0.66  CALCIUM 9.2  GFRNONAA >60  PROT 7.5  ALBUMIN 4.7  AST 31  ALT 30  ALKPHOS 50  BILITOT 0.7    RADIOGRAPHIC STUDIES: I have personally reviewed the radiological images as listed and agreed with the findings in the report. No results found.  ASSESSMENT & PLAN:   Carcinoma of upper-inner quadrant of right breast in female, estrogen receptor positive ([place]) #Stage I T1c- ER positive; PR negative; her 2 negative; s/p lumpectomy clear margins. ONCOTYPE-LOW RISK- s/p RT.   # ON anastrazole- tolerating well except for hot flashes- se below.  #Hot flashes grade 1-recommend monitor for now.  If worse would recommend Celexa.  #Osteoporosis screening: BMD- NOV 2022- [[facility]]- awaiting report from PCPs office.  Agree with calcium plus vitamin D.  # DISPOSITION: # Follow up in 3 months-MD; no labs;-Dr.B   All questions were answered. The patient/family knows to call the clinic with any problems, questions or concerns.    Cammie Sickle, MD 11/06/2021 3:38 PM

## 2021-11-06 NOTE — Progress Notes (Signed)
Pt states she is noticing more hot flashes.

## 2022-01-15 ENCOUNTER — Other Ambulatory Visit: Payer: Self-pay | Admitting: General Surgery

## 2022-01-15 DIAGNOSIS — C50211 Malignant neoplasm of upper-inner quadrant of right female breast: Secondary | ICD-10-CM

## 2022-02-07 ENCOUNTER — Inpatient Hospital Stay: Payer: Federal, State, Local not specified - PPO | Attending: Internal Medicine | Admitting: Internal Medicine

## 2022-02-07 ENCOUNTER — Ambulatory Visit
Admission: RE | Admit: 2022-02-07 | Discharge: 2022-02-07 | Disposition: A | Discharge status: Home / Self Care | Payer: Federal, State, Local not specified - PPO | Source: Ambulatory Visit | Attending: Radiation Oncology | Admitting: Radiation Oncology

## 2022-02-07 ENCOUNTER — Encounter: Payer: Self-pay | Admitting: Radiation Oncology

## 2022-02-07 VITALS — BP 144/80 | HR 80 | Temp 97.9°F | Resp 18 | Ht 63.5 in | Wt 190.3 lb

## 2022-02-07 DIAGNOSIS — C50211 Malignant neoplasm of upper-inner quadrant of right female breast: Secondary | ICD-10-CM

## 2022-02-07 DIAGNOSIS — Z170 Estrogen receptor positive status [ER+]: Secondary | ICD-10-CM | POA: Diagnosis not present

## 2022-02-07 DIAGNOSIS — Z79811 Long term (current) use of aromatase inhibitors: Secondary | ICD-10-CM | POA: Insufficient documentation

## 2022-02-07 DIAGNOSIS — Z923 Personal history of irradiation: Secondary | ICD-10-CM | POA: Insufficient documentation

## 2022-02-07 DIAGNOSIS — Z79899 Other long term (current) drug therapy: Secondary | ICD-10-CM | POA: Diagnosis not present

## 2022-02-07 DIAGNOSIS — N951 Menopausal and female climacteric states: Secondary | ICD-10-CM | POA: Diagnosis not present

## 2022-02-07 DIAGNOSIS — R232 Flushing: Secondary | ICD-10-CM | POA: Insufficient documentation

## 2022-02-07 NOTE — Assessment & Plan Note (Addendum)
#  Stage I T1c- ER positive; PR negative; her 2 negative; s/p lumpectomy clear margins. ONCOTYPE-LOW RISK- s/p RT. June 2022. ? ?# ON anastrazole- tolerating well except for hot flashes- see below. ? ?#Hot flashes grade 1-recommend monitor for now; STABLE ? ?#Osteoporosis screening: BMD- NOV 2022- [Dr.Moriyati; [facility]]-November 2022 bone density t score- -1.4 consistent with osteopenia. Reviewed the results for the patient. . Continue Calcium plus vitamin D; weightbearing exercises.  Defer to PCP re: repeat in 2 years/ and need for possible intervention ? ?# DISPOSITION: ?# Follow up in 6 months-MD; no labs;-Dr.B ? ?

## 2022-02-07 NOTE — Progress Notes (Signed)
Radiation Oncology ?Follow up Note ? ?Name: Meghan Harper   ?Date:   02/07/2022 ?MRN:  [id number] ?DOB: 1956-03-30  ? ? ?This 66 y.o. female presents to the clinic today for 93-monthfollow-up status post whole breast radiation to her right breast for stage Ia ER positive invasive mammary carcinoma. ? ?REFERRING PROVIDER: MMarinda Elk MD ? ?HPI: Patient is a 66year old female now out 6 months having completed whole breast radiation to her right breast for stage Ia ER positive invasive mammary carcinoma she is seen today in routine follow-up she is doing well.  She specifically denies breast tenderness cough or bone pain.  She has not yet had a mammogram that is scheduled for next month..  She is currently on Arimidex tolerating it well without side effect. ? ?COMPLICATIONS OF TREATMENT: none ? ?FOLLOW UP COMPLIANCE: keeps appointments  ? ?PHYSICAL EXAM:  ?BP (!) 144/80   Pulse 80   Temp 97.9 ?F (36.6 ?C)   Resp 18   Ht 5' 3.5" (1.613 m)   Wt 190 lb 4.8 oz (86.3 kg)   BMI 33.18 kg/m?  ?Lungs are clear to A&P cardiac examination essentially unremarkable with regular rate and rhythm. No dominant mass or nodularity is noted in either breast in 2 positions examined. Incision is well-healed. No axillary or supraclavicular adenopathy is appreciated. Cosmetic result is excellent.  Well-developed well-nourished patient in NAD. HEENT reveals PERLA, EOMI, discs not visualized.  Oral cavity is clear. No oral mucosal lesions are identified. Neck is clear without evidence of cervical or supraclavicular adenopathy. Lungs are clear to A&P. Cardiac examination is essentially unremarkable with regular rate and rhythm without murmur rub or thrill. Abdomen is benign with no organomegaly or masses noted. Motor sensory and DTR levels are equal and symmetric in the upper and lower extremities. Cranial nerves II through XII are grossly intact. Proprioception is intact. No peripheral adenopathy or edema is identified. No motor  or sensory levels are noted. Crude visual fields are within normal range. ? ?RADIOLOGY RESULTS: No current films for review ? ?PLAN: At the present time she is doing well with no evidence of disease.  On pleased with her overall progress.  Of asked to see around 6 months for follow-up.  She has mammograms next month.  She continues on Arimidex without side effect.  Patient knows to call with any concerns. ? ?I would like to take this opportunity to thank you for allowing me to participate in the care of your patient.. ?  ? GNoreene Filbert MD ? ?

## 2022-02-07 NOTE — Progress Notes (Signed)
[facility] [place] ?CONSULT NOTE ? ?Patient Care Team: ?Marinda Elk, MD as PCP - General (Physician Assistant) ?Theodore Demark, RN as Oncology Nurse Navigator ?Noreene Filbert, MD as Consulting Physician (Radiation Oncology) ?Cammie Sickle, MD as Consulting Physician (Internal Medicine) ?Robert Bellow, MD as Consulting Physician (General Surgery) ? ?CHIEF COMPLAINTS/PURPOSE OF CONSULTATION: Breast cancer ? ? ? ?Oncology History Overview Note  ?# June 2022- Targeted ultrasound was performed of the RIGHT upper inner breast. ?At 1:30 8 cm from the nipple, there is an irregular hypoechoic mass ?with indistinct margins. It measures approximately 10 x 5 x 6 mm. ?This corresponds to the site of screening mammographic concern. ?  ?Targeted ultrasound was performed of the RIGHT axilla. No suspicious ?axillary lymph nodes are seen. ?  ?IMPRESSION: ?1. There is a 10 mm mass in the RIGHT upper inner breast which is ?concerning for malignancy. Recommend ultrasound-guided biopsy for ?definitive characterization. ? ?DIAGNOSIS:  ?A. BREAST, RIGHT, 1:30 O'CLOCK 8 CM FROM NIPPLE; ULTRASOUND-GUIDED CORE  ?BIOPSY:  ?- INVASIVE MAMMARY CARCINOMA, NO SPECIAL TYPE.  ? ?Size of invasive carcinoma: 7 mm in this sample  ?Histologic grade of invasive carcinoma: Grade 2  ?                     Glandular/tubular differentiation score: 3  ?                     Nuclear pleomorphism score: 2  ?                     Mitotic rate score: 1  ?                     Total score: 6  ?Ductal carcinoma in situ: Present, intermediate grade  ?Lymphovascular invasion: Not identified  ? ?CASE SUMMARY: BREAST BIOMARKER TESTS  ?Estrogen Receptor (ER) Status: POSITIVE  ?        Percentage of cells with nuclear positivity: Greater than 90%  ?        Average intensity of staining: Strong  ? ?Progesterone Receptor (PgR) Status: NEGATIVE (less than 1%)  ?        Internal control cells present and stain as expected  ? ?HER2 (by  immunohistochemistry): NEGATIVE (Score 0) ? ?TNM Descriptors: Not applicable  ?pT1c  ?Regional Lymph Nodes Modifier: Not applicable  ?pN0  ?pM - Not applicable ? 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  ? ?#Right breast cancer status postlumpectomy-Oncotype low risk; no chemotherapy. ? ?# NOV 14th, 2022-start anastrozole for 5 years. ?  ?Carcinoma of upper-inner quadrant of right breast in female, estrogen receptor positive ([place])  ?04/18/2021 Initial Diagnosis  ? Carcinoma of upper-inner quadrant of right breast in female, estrogen receptor positive ([place]) ?  ? Genetic Testing  ? Negative genetic testing. No pathogenic variants identified on the [facility] CancerNext-Expanded+RNA Panel. The report date is 04/30/2021.  ? ?The CancerNext-Expanded + RNAinsight gene panel offered by [employer] and includes sequencing and rearrangement analysis for the following 77 genes: IP, ALK, APC*, ATM*, AXIN2, BAP1, BARD1, BLM, BMPR1A, BRCA1*, BRCA2*, BRIP1*, CDC73, CDH1*,CDK4, CDKN1B, CDKN2A, CHEK2*, CTNNA1, DICER1, FANCC, FH, FLCN, GALNT12, KIF1B, LZTR1, MAX, MEN1, MET, MLH1*, MSH2*, MSH3, MSH6*, MUTYH*, NBN, NF1*, NF2, NTHL1, PALB2*, PHOX2B, PMS2*, POT1, PRKAR1A, PTCH1, PTEN*, RAD51C*, RAD51D*,RB1, RECQL, RET, SDHA, SDHAF2, SDHB, SDHC, SDHD, SMAD4, SMARCA4, SMARCB1, SMARCE1, STK11, SUFU, TMEM127, TP53*,TSC1, TSC2, VHL and XRCC2 (sequencing and deletion/duplication); EGFR,  EGLN1, HOXB13, KIT, MITF, PDGFRA, POLD1 and POLE (sequencing only); EPCAM and GREM1 (deletion/duplication only). ?  ?05/25/2021 Cancer Staging  ? Staging form: Breast, AJCC 8th Edition ?- Pathologic: Stage IA (pT1c, pN0, cM0, G2, ER+, PR-, HER2-) - Signed by Cammie Sickle, MD on 05/25/2021 ?Histologic grading system: 3 grade system ? ?  ? ? ?HISTORY OF PRESENTING ILLNESS: Ambulating independently.  Alone. ? ?Meghan Harper 66 y.o.  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 ? ?Patient  denies any worsening hot flashes or joint pains.  Denies any nausea vomiting abdominal pain. ? ?Review of Systems  ?Constitutional:  Negative for chills, diaphoresis, fever, malaise/fatigue and weight loss.  ?HENT:  Negative for nosebleeds and sore throat.   ?Eyes:  Negative for double vision.  ?Respiratory:  Negative for cough, hemoptysis, sputum production, shortness of breath and wheezing.   ?Cardiovascular:  Negative for chest pain, palpitations, orthopnea and leg swelling.  ?Gastrointestinal:  Negative for abdominal pain, blood in stool, constipation, diarrhea, heartburn, melena, nausea and vomiting.  ?Genitourinary:  Negative for dysuria, frequency and urgency.  ?Musculoskeletal:  Negative for back pain and joint pain.  ?Skin: Negative.  Negative for itching and rash.  ?Neurological:  Negative for dizziness, tingling, focal weakness, weakness and headaches.  ?Endo/Heme/Allergies:  Does not bruise/bleed easily.  ?Psychiatric/Behavioral:  Negative for depression. The patient is not nervous/anxious and does not have insomnia.    ? ?MEDICAL HISTORY:  ?Past Medical History:  ?Diagnosis Date  ? Arthritis   ? Family history of breast cancer   ? Family history of CLL (chronic lymphoid leukemia)   ? Family history of colon cancer   ? Family history of melanoma   ? Family history of pancreatic cancer   ? 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 Right upper back. 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 Excised: 04/21/2010  ? Pre-diabetes   ? Wears contact lenses   ? ? ?SURGICAL HISTORY: ?Past Surgical History:  ?Procedure Laterality Date  ? BREAST BIOPSY Right 1987, 2013  ? fibroadenoma x 2  ? BREAST BIOPSY Right 04/06/2021  ? u/s bx-"venus" clip-path pendning  ? BREAST EXCISIONAL BIOPSY Left 1978  ? BREAST FIBROADENOMA SURGERY    ? BREAST LUMPECTOMY WITH SENTINEL LYMPH NODE BIOPSY Right 05/08/2021  ? Procedure: BREAST LUMPECTOMY WITH SENTINEL LYMPH NODE BX;  Surgeon: Robert Bellow, MD;  Location: [facility];  Service: General;   Laterality: Right;  ? CARPAL TUNNEL RELEASE Right 08/28/2017  ? Procedure: CARPAL TUNNEL RELEASE ENDOSCOPIC;  Surgeon: Corky Mull, MD;  Location: [place];  Service: Orthopedics;  Laterality: Right;  ? CARPAL TUNNEL RELEASE Left 11/06/2017  ? Procedure: CARPAL TUNNEL RELEASE ENDOSCOPIC;  Surgeon: Corky Mull, MD;  Location: [place];  Service: Orthopedics;  Laterality: Left;  ? COLONOSCOPY N/A 09/26/2015  ? Procedure: COLONOSCOPY;  Surgeon: Manya Silvas, MD;  Location: [facility] END[facility]COPY;  Service: Endoscopy;  Laterality: N/A;  ? TOOTH EXTRACTION    ? ? ?SOCIAL HISTORY: ?Social History  ? ?Socioeconomic History  ? Marital status: Single  ?  Spouse name: Not on file  ? Number of children: Not on file  ? Years of education: Not on file  ? Highest education level: Not on file  ?Occupational History  ? Not on file  ?Tobacco Use  ? Smoking status: Never  ? Smokeless tobacco: Never  ?Vaping Use  ? Vaping Use: Never used  ?Substance  and Sexual Activity  ? Alcohol use: Yes  ?  Alcohol/week: 0.0 standard drinks  ?  Comment: may have a drink 3-4x/yr  ? Drug use: No  ? Sexual activity: Not on file  ?Other Topics Concern  ? Not on file  ?Social History Narrative  ? Not on file  ? ?Social Determinants of Health  ? ?Financial Resource Strain: Not on file  ?Food Insecurity: Not on file  ?Transportation Needs: Not on file  ?Physical Activity: Not on file  ?Stress: Not on file  ?Social Connections: Not on file  ?Intimate Partner Violence: Not on file  ? ? ?FAMILY HISTORY: ?Family History  ?Problem Relation Age of Onset  ? Colon cancer Mother   ?     dx 78s  ? Other Brother   ?     CLL and skin cancers  ? Breast cancer Maternal Aunt 60  ? Lung cancer Maternal Aunt   ? Breast cancer Paternal Aunt 61  ? Pancreatic cancer Paternal Aunt   ? Melanoma Maternal Uncle   ? Breast cancer Niece   ?     dx 22s, "BRCA pos"  ? ? ?ALLERGIES:  is allergic to wound dressing adhesive and meloxicam. ? ?MEDICATIONS:  ?Current  Outpatient Medications  ?Medication Sig Dispense Refill  ? anastrozole (ARIMIDEX) 1 MG tablet Take 1 tablet (1 mg total) by mouth daily. 90 tablet 1  ? Calcium Carb-Cholecalciferol (CALCIUM 600 + D PO) Take 1 tablet by mouth

## 2022-02-07 NOTE — Progress Notes (Signed)
Survivorship Care Plan visit completed.  Treatment summary reviewed and given to patient.  ASCO answers booklet reviewed and given to patient.  CARE program and Cancer Transitions discussed with patient along with other resources cancer center offers to patients and caregivers.  Patient verbalized understanding.    

## 2022-02-07 NOTE — Progress Notes (Signed)
Patient denies new problems/concerns today.   °

## 2022-03-05 ENCOUNTER — Other Ambulatory Visit: Payer: Federal, State, Local not specified - PPO

## 2022-03-08 ENCOUNTER — Ambulatory Visit
Admission: RE | Admit: 2022-03-08 | Discharge: 2022-03-08 | Disposition: A | Discharge status: Home / Self Care | Payer: Federal, State, Local not specified - PPO | Source: Ambulatory Visit | Attending: General Surgery | Admitting: General Surgery

## 2022-03-08 ENCOUNTER — Other Ambulatory Visit: Payer: Federal, State, Local not specified - PPO

## 2022-03-08 DIAGNOSIS — Z170 Estrogen receptor positive status [ER+]: Secondary | ICD-10-CM

## 2022-03-08 DIAGNOSIS — C50211 Malignant neoplasm of upper-inner quadrant of right female breast: Secondary | ICD-10-CM | POA: Insufficient documentation

## 2022-03-08 HISTORY — DX: Personal history of irradiation: Z92.3

## 2022-04-13 ENCOUNTER — Encounter: Payer: Self-pay | Admitting: *Deleted

## 2022-04-16 ENCOUNTER — Encounter
Admission: RE | Disposition: A | Discharge status: Home / Self Care | Payer: Self-pay | Source: Home / Self Care | Attending: Gastroenterology

## 2022-04-16 ENCOUNTER — Ambulatory Visit
Admission: RE | Admit: 2022-04-16 | Discharge: 2022-04-16 | Disposition: A | Discharge status: Home / Self Care | Payer: Federal, State, Local not specified - PPO | Attending: Gastroenterology | Admitting: Gastroenterology

## 2022-04-16 ENCOUNTER — Ambulatory Visit: Payer: Federal, State, Local not specified - PPO | Admitting: Anesthesiology

## 2022-04-16 ENCOUNTER — Encounter: Payer: Self-pay | Admitting: *Deleted

## 2022-04-16 DIAGNOSIS — K219 Gastro-esophageal reflux disease without esophagitis: Secondary | ICD-10-CM | POA: Insufficient documentation

## 2022-04-16 DIAGNOSIS — Z86010 Personal history of colonic polyps: Secondary | ICD-10-CM | POA: Diagnosis not present

## 2022-04-16 DIAGNOSIS — K640 First degree hemorrhoids: Secondary | ICD-10-CM | POA: Diagnosis not present

## 2022-04-16 DIAGNOSIS — Z1211 Encounter for screening for malignant neoplasm of colon: Secondary | ICD-10-CM | POA: Diagnosis present

## 2022-04-16 DIAGNOSIS — Z800 Family history of malignant neoplasm of digestive organs: Secondary | ICD-10-CM | POA: Diagnosis not present

## 2022-04-16 DIAGNOSIS — Z79899 Other long term (current) drug therapy: Secondary | ICD-10-CM | POA: Insufficient documentation

## 2022-04-16 DIAGNOSIS — Q438 Other specified congenital malformations of intestine: Secondary | ICD-10-CM | POA: Insufficient documentation

## 2022-04-16 HISTORY — PX: COLONOSCOPY WITH PROPOFOL: SHX5780

## 2022-04-16 SURGERY — COLONOSCOPY WITH PROPOFOL
Anesthesia: General

## 2022-04-16 MED ORDER — PROPOFOL 500 MG/50ML IV EMUL
INTRAVENOUS | Status: DC | PRN
  Administered 2022-04-16: 150 ug/kg/min via INTRAVENOUS

## 2022-04-16 MED ORDER — SODIUM CHLORIDE 0.9 % IV SOLN
INTRAVENOUS | Status: DC
  Administered 2022-04-16: 20 mL/h via INTRAVENOUS

## 2022-04-16 MED ORDER — PROPOFOL 10 MG/ML IV BOLUS
INTRAVENOUS | Status: AC
  Filled 2022-04-16: qty 20

## 2022-04-16 MED ORDER — GLYCOPYRROLATE 0.2 MG/ML IJ SOLN
INTRAMUSCULAR | Status: AC
  Filled 2022-04-16: qty 1

## 2022-04-16 MED ORDER — STERILE WATER FOR IRRIGATION IR SOLN
Status: DC | PRN
  Administered 2022-04-16: 60 mL

## 2022-04-16 MED ORDER — GLYCOPYRROLATE 0.2 MG/ML IJ SOLN
INTRAMUSCULAR | Status: DC | PRN
  Administered 2022-04-16: .4 mg via INTRAVENOUS

## 2022-04-16 NOTE — Anesthesia Postprocedure Evaluation (Signed)
Anesthesia Post Note  Patient: Ward [name]  Procedure(s) Performed: COLONOSCOPY WITH PROPOFOL  Patient location during evaluation: Endoscopy Anesthesia Type: General Level of consciousness: awake and alert Pain management: pain level controlled Vital Signs Assessment: post-procedure vital signs reviewed and stable Respiratory status: spontaneous breathing, nonlabored ventilation, respiratory function stable and patient connected to nasal cannula oxygen Cardiovascular status: blood pressure returned to baseline and stable Postop Assessment: no apparent nausea or vomiting Anesthetic complications: no   No notable events documented.   Last Vitals:  Vitals:   04/16/22 0957 04/16/22 1022  BP: (!) 108/51   Pulse:    Resp: 16   Temp:    SpO2: 100% 100%    Last Pain:  Vitals:   04/16/22 1022  TempSrc:   PainSc: 0-No pain                 Cleda Mccreedy Zylah Elsbernd

## 2022-04-16 NOTE — Transfer of Care (Signed)
Immediate Anesthesia Transfer of Care Note  Patient: Meghan Harper  Procedure(s) Performed: COLONOSCOPY WITH PROPOFOL  Patient Location: PACU  Anesthesia Type:General  Level of Consciousness: awake and sedated  Airway & Oxygen Therapy: Patient Spontanous Breathing and Patient connected to face mask oxygen  Post-op Assessment: Report given to RN and Post -op Vital signs reviewed and stable  Post vital signs: Reviewed and stable  Last Vitals:  Vitals Value Taken Time  BP    Temp    Pulse    Resp    SpO2      Last Pain:  Vitals:   04/16/22 0843  TempSrc: Temporal  PainSc: 0-No pain         Complications: No notable events documented.

## 2022-04-17 ENCOUNTER — Encounter: Payer: Self-pay | Admitting: Gastroenterology

## 2022-07-14 IMAGING — MG MM BREAST LOCALIZATION CLIP
4 series · 4 of 12 positions shown · non-contrast
Comparison: Previous exam(s).

CLINICAL DATA: Status post ultrasound-guided biopsy

EXAM:
DIAGNOSTIC RIGHT MAMMOGRAM POST ULTRASOUND BIOPSY

[R CC synth-2D]
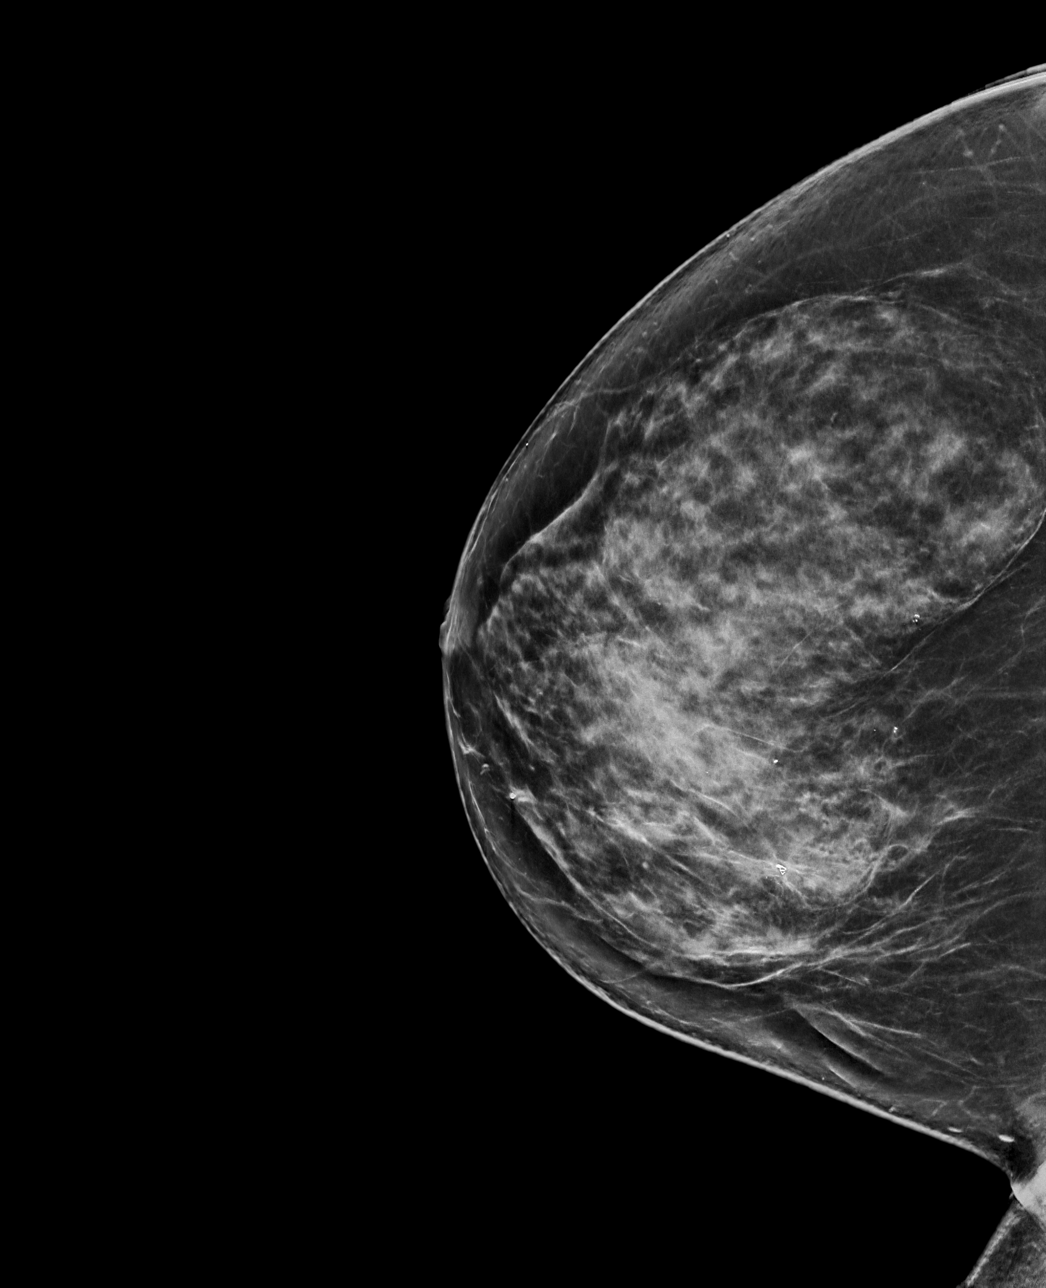

[R ML synth-2D]
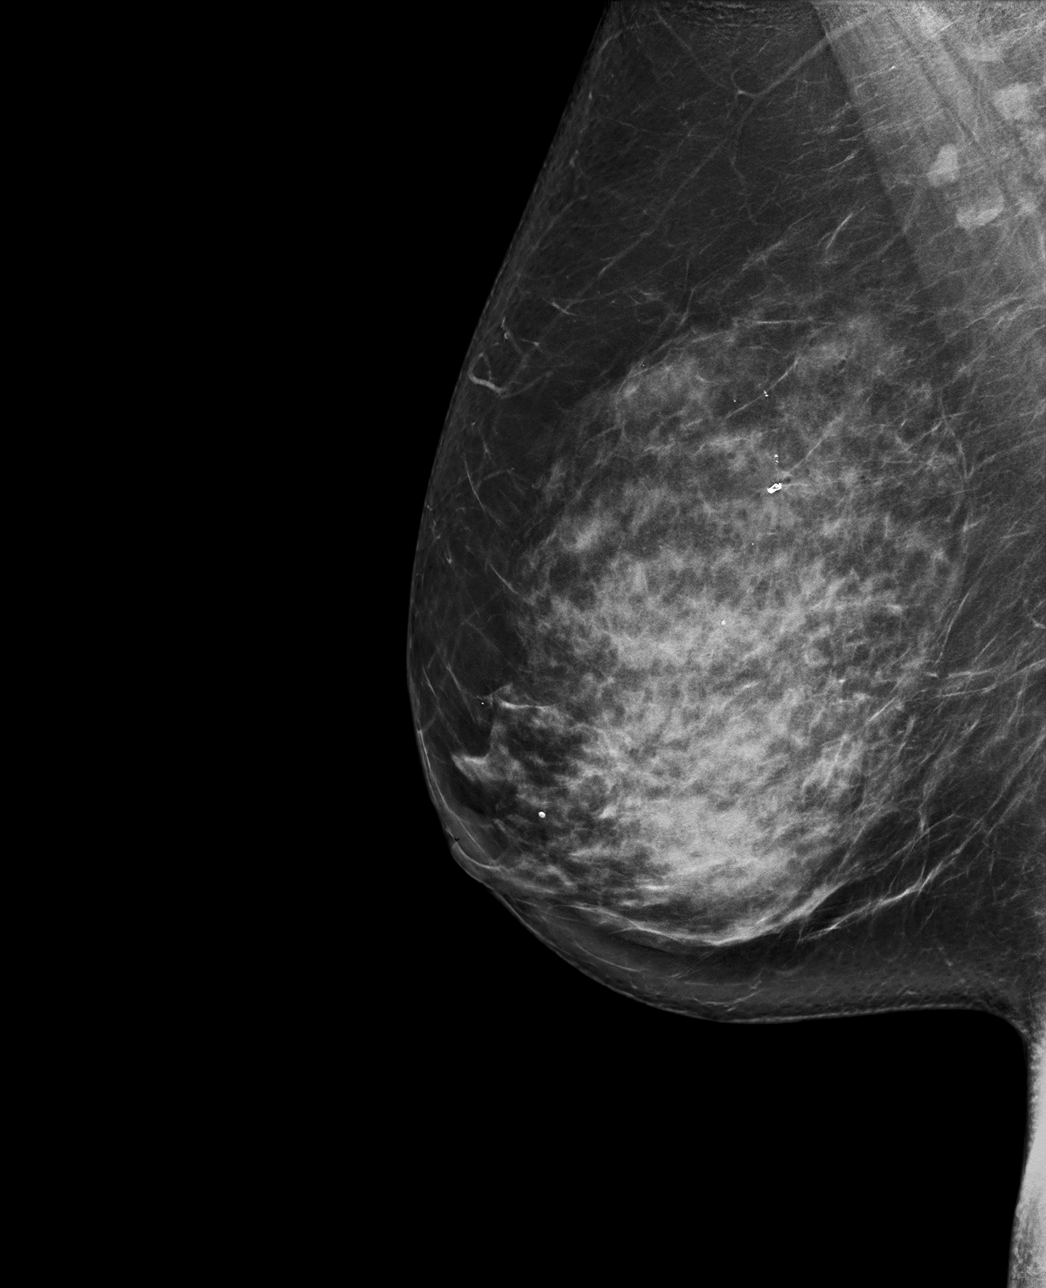

[R ML tomo · tomo slice 45/88.0]
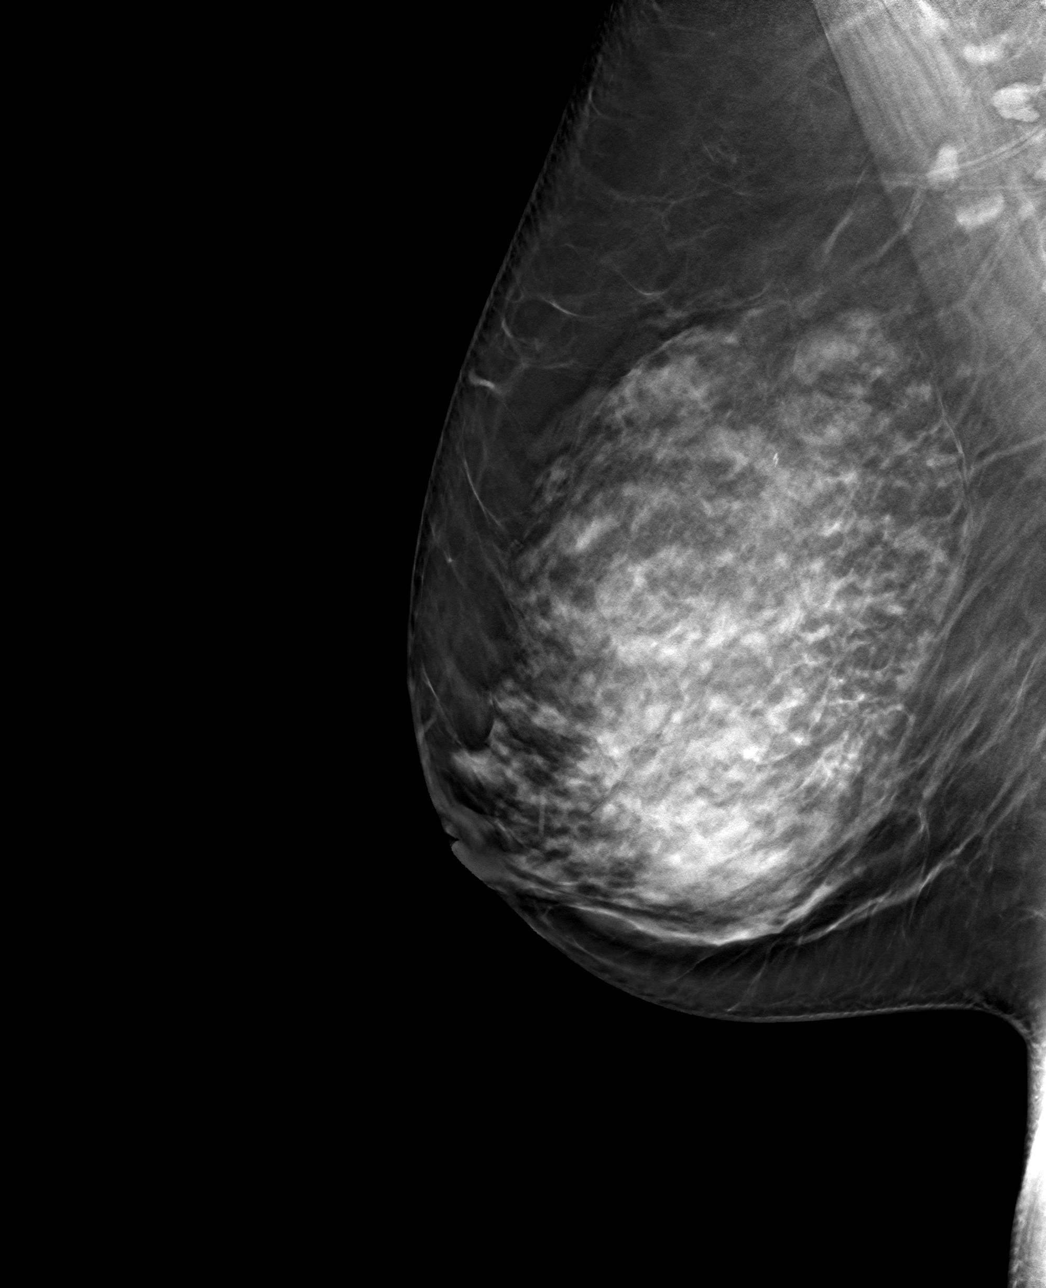

[R CC tomo · tomo slice 43/84.0]
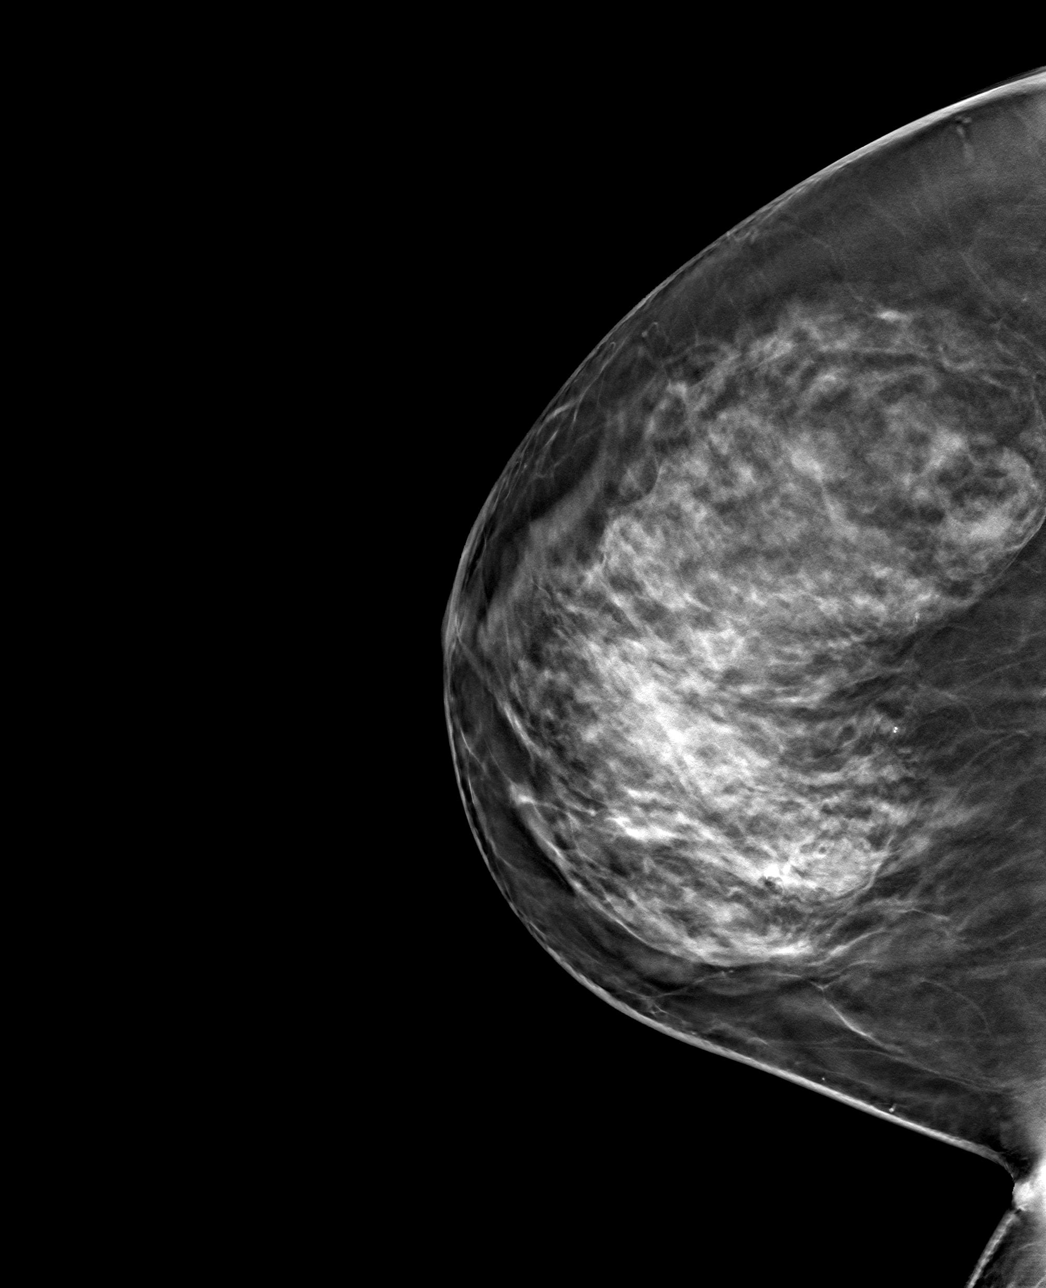

[4 of 12 positions shown; findings below may reference images not displayed]

FINDINGS: Mammographic images were obtained following ultrasound guided biopsy
of a mass in the RIGHT upper inner breast. The HORAK biopsy marking
clip is in expected position at the site of biopsy.
IMPRESSION: Appropriate positioning of the HORAK shaped biopsy marking clip at
the site of biopsy in the RIGHT upper inner breast at site of mass
with associated architectural distortion.

Final Assessment: Post Procedure Mammograms for Marker Placement

## 2022-07-17 ENCOUNTER — Ambulatory Visit: Payer: Federal, State, Local not specified - PPO | Admitting: Dermatology

## 2022-07-17 DIAGNOSIS — D225 Melanocytic nevi of trunk: Secondary | ICD-10-CM

## 2022-07-17 DIAGNOSIS — L821 Other seborrheic keratosis: Secondary | ICD-10-CM

## 2022-07-17 DIAGNOSIS — Z1283 Encounter for screening for malignant neoplasm of skin: Secondary | ICD-10-CM | POA: Diagnosis not present

## 2022-07-17 DIAGNOSIS — L578 Other skin changes due to chronic exposure to nonionizing radiation: Secondary | ICD-10-CM

## 2022-07-17 DIAGNOSIS — L738 Other specified follicular disorders: Secondary | ICD-10-CM

## 2022-07-17 DIAGNOSIS — Z86006 Personal history of melanoma in-situ: Secondary | ICD-10-CM

## 2022-07-17 DIAGNOSIS — D229 Melanocytic nevi, unspecified: Secondary | ICD-10-CM

## 2022-07-17 DIAGNOSIS — L814 Other melanin hyperpigmentation: Secondary | ICD-10-CM

## 2022-07-17 DIAGNOSIS — L818 Other specified disorders of pigmentation: Secondary | ICD-10-CM

## 2022-07-17 DIAGNOSIS — D1801 Hemangioma of skin and subcutaneous tissue: Secondary | ICD-10-CM

## 2022-07-17 NOTE — Progress Notes (Signed)
Follow-Up Visit   Subjective  Meghan Harper is a 66 y.o. female who presents for the following: Annual Exam.  The patient presents for Total-Body Skin Exam (TBSE) for skin cancer screening and mole check.  The patient has spots, moles and lesions to be evaluated, some may be new or changing. She has a history of melanoma in situ of the right upper back (2011). She has newer white spot on upper lip to check. Came up since last visit.  The following portions of the chart were reviewed this encounter and updated as appropriate:       Review of Systems:  No other skin or systemic complaints except as noted in HPI or Assessment and Plan.  Objective  Well appearing patient in no apparent distress; mood and affect are within normal limits.  A full examination was performed including scalp, head, eyes, ears, nose, lips, neck, chest, axillae, abdomen, back, buttocks, bilateral upper extremities, bilateral lower extremities, hands, feet, fingers, toes, fingernails, and toenails. All findings within normal limits unless otherwise noted below.  Left buttock 7 x 4 mm medium dark brown macule- present for yrs per pt, no changes   right medial clavicle 8 x 3 mm waxy tan macule two toned.   Right Upper Cutaneous Lip 0.8 cm hypopigmented macule, no history of prior cryotherapy or rash in area    Assessment & Plan  Skin cancer screening performed today.  History of Melanoma in Situ of the right upper back - No evidence of recurrence today - Recommend regular full body skin exams - Recommend daily broad spectrum sunscreen SPF 30+ to sun-exposed areas, reapply every 2 hours as needed.  - Call if any new or changing lesions are noted between office visits  Seborrheic Keratoses - Stuck-on, waxy, tan-brown papules and/or plaques  - Benign-appearing - Discussed benign etiology and prognosis. - Observe - Call for any changes  Lentigines - Scattered tan macules - Due to sun exposure -  Benign-appearing, observe - Recommend daily broad spectrum sunscreen SPF 30+ to sun-exposed areas, reapply every 2 hours as needed. - Call for any changes  Sebaceous Hyperplasia - Small yellow papules with a central dell - Benign - Observe  Hemangiomas - Red papules - Discussed benign nature - Observe - Call for any changes  Melanocytic Nevi - Tan-brown and/or pink-flesh-colored symmetric macules and papules - Benign appearing on exam today - Observation - Call clinic for new or changing moles - Recommend daily use of broad spectrum spf 30+ sunscreen to sun-exposed areas.   Nevus Left buttock  Benign-appearing, stable.  Observation.  Call clinic for new or changing moles.  Recommend daily use of broad spectrum spf 30+ sunscreen to sun-exposed areas.   Seborrheic keratosis right medial clavicle  Stable. Reassured benign age-related growth.  Recommend observation.  Discussed cryotherapy if spot(s) become irritated or inflamed.  Post inflammatory hypopigmentation Right Upper Cutaneous Lip  Vs Early Vitiligo  Start Opzelura Cream Apply to light area once to twice daily, sample given. Patient will call for Rx if improving (send to [facility]).  Observation.  Vitiligo is a chronic autoimmune condition which causes loss of skin pigment and is commonly seen on the face and may also involve areas of trauma like hands, elbows, knees, and ankles. There is no cure and it is difficult to treat.  Treatments include topical steroids and other topical anti-inflammatory ointments/creams and topical and oral Jak inhibitors.  Sometimes narrow band UV light therapy or Xtrac laser is helpful,  both of which require twice weekly treatments for at least 3-6 months.  Antioxidant vitamins, such as Vitamins A,C,E,D, Folic Acid and B12 may be added to enhance treatment.    Return in about 1 year (around 07/18/2023) for TBSE, Hx melanoma in situ.  IJamesetta Orleans, CMA, am acting as scribe for Brendolyn Patty, MD .  Documentation: I have reviewed the above documentation for accuracy and completeness, and I agree with the above.  Brendolyn Patty MD

## 2022-07-17 NOTE — Patient Instructions (Addendum)
Start Opzelura Cream - Apply to light spot on upper lip once to twice a day until improved.    Melanoma ABCDEs  Melanoma is the most dangerous type of skin cancer, and is the leading cause of death from skin disease.  You are more likely to develop melanoma if you: Have light-colored skin, light-colored eyes, or red or blond hair Spend a lot of time in the sun Tan regularly, either outdoors or in a tanning bed Have had blistering sunburns, especially during childhood Have a close family member who has had a melanoma Have atypical moles or large birthmarks  Early detection of melanoma is key since treatment is typically straightforward and cure rates are extremely high if we catch it early.   The first sign of melanoma is often a change in a mole or a new dark spot.  The ABCDE system is a way of remembering the signs of melanoma.  A for asymmetry:  The two halves do not match. B for border:  The edges of the growth are irregular. C for color:  A mixture of colors are present instead of an even brown color. D for diameter:  Melanomas are usually (but not always) greater than 56m - the size of a pencil eraser. E for evolution:  The spot keeps changing in size, shape, and color.  Please check your skin once per month between visits. You can use a small mirror in front and a large mirror behind you to keep an eye on the back side or your body.   If you see any new or changing lesions before your next follow-up, please call to schedule a visit.  Please continue daily skin protection including broad spectrum sunscreen SPF 30+ to sun-exposed areas, reapplying every 2 hours as needed when you're outdoors.   Staying in the shade or wearing long sleeves, sun glasses (UVA+UVB protection) and wide brim hats (4-inch brim around the entire circumference of the hat) are also recommended for sun protection.     Due to recent changes in healthcare laws, you may see results of your pathology and/or  laboratory studies on MyChart before the doctors have had a chance to review them. We understand that in some cases there may be results that are confusing or concerning to you. Please understand that not all results are received at the same time and often the doctors may need to interpret multiple results in order to provide you with the best plan of care or course of treatment. Therefore, we ask that you please give u[place]2 business days to thoroughly review all your results before contacting the office for clarification. Should we see a critical lab result, you will be contacted sooner.   If You Need Anything After Your Visit  If you have any questions or concerns for your doctor, please call our main line at [phone] press option 4 to reach your doctor's medical assistant. If no one answers, please leave a voicemail as directed and we will return your call as soon as possible. Messages left after 4 pm will be answered the following business day.   You may also send [place] message via [facility] We typically respond to MyChart messages within 1-2 business days.  For prescription refills, please ask your pharmacy to contact our office. Our fax number is [phone]  If you have an urgent issue when the clinic is closed that cannot wait until the next business day, you can page your doctor at the number below.  Please note that while we do our best to be available for urgent issues outside of office hours, we are not available 24/7.   If you have an urgent issue and are unable to reach [place], you may choose to seek medical care at your doctor's office, retail clinic, urgent care center, or emergency room.  If you have a medical emergency, please immediately call 911 or go to the emergency department.  Pager Numbers  - Dr. Nehemiah Massed: [phone]  - Dr. Laurence Ferrari: [phone]  - Dr. Nicole Kindred: [phone]  In the event of inclement weather, please call our main line at [phone] for an  update on the status of any delays or closures.  Dermatology Medication Tips: Please keep the boxes that topical medications come in in order to help keep track of the instructions about where and how to use these. Pharmacies typically print the medication instructions only on the boxes and not directly on the medication tubes.   If your medication is too expensive, please contact our office at [phone] option 4 or send [place] a message through [place].   We are unable to tell what your co-pay for medications will be in advance as this is different depending on your insurance coverage. However, we may be able to find a substitute medication at lower cost or fill out paperwork to get insurance to cover a needed medication.   If a prior authorization is required to get your medication covered by your insurance company, please allow [place] 1-2 business days to complete this process.  Drug prices often vary depending on where the prescription is filled and some pharmacies may offer cheaper prices.  The website www.goodrx.com contains coupons for medications through different pharmacies. The prices here do not account for what the cost may be with help from insurance (it may be cheaper with your insurance), but the website can give you the price if you did not use any insurance.  - You can print the associated coupon and take it with your prescription to the pharmacy.  - You may also stop by our office during regular business hours and pick up a GoodRx coupon card.  - If you need your prescription sent electronically to a different pharmacy, notify our office through [facility] or by phone at [phone] option 4.     Si Usted Necesita Algo Despus de Su Visita  Tambin puede enviarnos un mensaje a travs de Pharmacist, community. Por lo general respondemos a los mensajes de MyChart en el transcurso de 1 a 2 das hbiles.  Para renovar recetas, por favor pida a su farmacia que se ponga en contacto con  nuestra oficina. Harland Dingwall de fax es [place] [phone].  Si tiene un asunto urgente cuando la clnica est cerrada y que no puede esperar hasta el siguiente da hbil, puede llamar/localizar a su doctor(a) al nmero que aparece a continuacin.   Por favor, tenga en cuenta que aunque hacemos todo lo posible para estar disponibles para asuntos urgentes fuera del horario de [place], no estamos disponibles las 24 horas del da, los 7 das de la [place].   Si tiene un problema urgente y no puede comunicarse con nosotros, puede optar por buscar atencin mdica  en el consultorio de su doctor(a), en una clnica privada, en un centro de atencin urgente o en una sala de emergencias.  Si tiene Engineering geologist, por favor llame inmediatamente al 911 o vaya a la sala de emergencias.  Nmeros de bper  - Dr. Nehemiah Massed:  [phone]  - Dra. Moye: [phone]  - Dra. Nicole Kindred: [phone]  En caso de inclemencias del [place], por favor llame a Johnsie Kindred principal al [phone] para una actualizacin sobre el [place] de cualquier retraso o cierre.  Consejos para la medicacin en dermatologa: Por favor, guarde las cajas en las que vienen los medicamentos de uso tpico para ayudarle a seguir las instrucciones sobre dnde y cmo usarlos. Las farmacias generalmente imprimen las instrucciones del medicamento slo en las cajas y no directamente en los tubos del [place].   Si su medicamento es muy caro, por favor, pngase en contacto con Zigmund Daniel llamando al [phone] y presione la opcin 4 o envenos un mensaje a travs de Pharmacist, community.   No podemos decirle cul ser su copago por los medicamentos por adelantado ya que esto es diferente dependiendo de la cobertura de su seguro. Sin embargo, es posible que podamos encontrar un medicamento sustituto a Electrical engineer un formulario para que el seguro cubra el medicamento que se considera necesario.   Si se requiere una autorizacin  previa para que su compaa de seguros [place] su medicamento, por favor permtanos de 1 a 2 das hbiles para completar este proceso.  Los precios de los medicamentos varan con frecuencia dependiendo del Environmental consultant de dnde se surte la receta y alguna farmacias pueden ofrecer precios ms baratos.  El sitio web www.goodrx.com tiene cupones para medicamentos de Airline pilot. Los precios aqu no tienen en cuenta lo que podra costar con la ayuda del seguro (puede ser ms barato con su seguro), pero el sitio web puede darle el precio si no utiliz Research scientist (physical sciences).  - Puede imprimir el cupn correspondiente y llevarlo con su receta a la farmacia.  - Tambin puede pasar por nuestra oficina durante el horario de atencin regular y Charity fundraiser una tarjeta de cupones de GoodRx.  - Si necesita que su receta se enve electrnicamente a una farmacia diferente, informe a nuestra oficina a travs de MyChart de [place] o por telfono llamando al [phone] y presione la opcin 4.

## 2022-07-18 ENCOUNTER — Other Ambulatory Visit: Payer: Self-pay | Admitting: Internal Medicine

## 2022-08-02 ENCOUNTER — Encounter: Payer: Self-pay | Admitting: *Deleted

## 2022-08-10 ENCOUNTER — Ambulatory Visit: Payer: Federal, State, Local not specified - PPO | Admitting: Radiation Oncology

## 2022-08-10 ENCOUNTER — Encounter: Payer: Self-pay | Admitting: Internal Medicine

## 2022-08-10 ENCOUNTER — Inpatient Hospital Stay: Payer: Federal, State, Local not specified - PPO | Attending: Internal Medicine | Admitting: Internal Medicine

## 2022-08-10 DIAGNOSIS — M8580 Other specified disorders of bone density and structure, unspecified site: Secondary | ICD-10-CM | POA: Insufficient documentation

## 2022-08-10 DIAGNOSIS — Z170 Estrogen receptor positive status [ER+]: Secondary | ICD-10-CM | POA: Insufficient documentation

## 2022-08-10 DIAGNOSIS — Z79811 Long term (current) use of aromatase inhibitors: Secondary | ICD-10-CM | POA: Diagnosis not present

## 2022-08-10 DIAGNOSIS — C50211 Malignant neoplasm of upper-inner quadrant of right female breast: Secondary | ICD-10-CM | POA: Diagnosis present

## 2022-08-10 DIAGNOSIS — N951 Menopausal and female climacteric states: Secondary | ICD-10-CM | POA: Insufficient documentation

## 2022-08-10 NOTE — Progress Notes (Signed)
Episodes of left thigh Muscle ache especially in the morning an late evening for the past month.  Having GI issues since colonoscopy in 03/2022

## 2022-08-10 NOTE — Progress Notes (Signed)
[facility] NOTE  Patient Care Team: Marinda Elk, MD as PCP - General (Physician Assistant) Theodore Demark, RN (Inactive) as Oncology Nurse Navigator Noreene Filbert, MD as Consulting Physician (Radiation Oncology) Cammie Sickle, MD as Consulting Physician (Internal Medicine) Robert Bellow, MD as Consulting Physician (General Surgery)  CHIEF COMPLAINTS/PURPOSE OF CONSULTATION: Breast cancer    Oncology History Overview Note  # June 2022- Targeted ultrasound was performed of the RIGHT upper inner breast. At 1:30 8 cm from the nipple, there is an irregular hypoechoic mass with indistinct margins. It measures approximately 10 x 5 x 6 mm. This corresponds to the site of screening mammographic concern.   Targeted ultrasound was performed of the RIGHT axilla. No suspicious axillary lymph nodes are seen.   IMPRESSION: 1. There is a 10 mm mass in the RIGHT upper inner breast which is concerning for malignancy. Recommend ultrasound-guided biopsy for definitive characterization.  DIAGNOSIS:  A. BREAST, RIGHT, 1:30 O'CLOCK 8 CM FROM NIPPLE; ULTRASOUND-GUIDED CORE  BIOPSY:  - INVASIVE MAMMARY CARCINOMA, NO SPECIAL TYPE.   Size of invasive carcinoma: 7 mm in this sample  Histologic grade of invasive carcinoma: Grade 2                       Glandular/tubular differentiation score: 3                       Nuclear pleomorphism score: 2                       Mitotic rate score: 1                       Total score: 6  Ductal carcinoma in situ: Present, intermediate grade  Lymphovascular invasion: Not identified   CASE SUMMARY: BREAST BIOMARKER TESTS  Estrogen Receptor (ER) Status: POSITIVE          Percentage of cells with nuclear positivity: Greater than 90%          Average intensity of staining: Strong   Progesterone Receptor (PgR) Status: NEGATIVE (less than 1%)          Internal control cells present and stain as expected   HER2 (by  immunohistochemistry): NEGATIVE (Score 0)  TNM Descriptors: Not applicable  TW6F  Regional Lymph Nodes Modifier: Not applicable  pN0  pM - Not applicable  #  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   #Right breast cancer status postlumpectomy-Oncotype low risk; no chemotherapy.  # NOV 14th, 2022-start anastrozole for 5 years.   Carcinoma of upper-inner quadrant of right breast in female, estrogen receptor positive ([place])  04/18/2021 Initial Diagnosis   Carcinoma of upper-inner quadrant of right breast in female, estrogen receptor positive ([place])    Genetic Testing   Negative genetic testing. No pathogenic variants identified on the [facility] CancerNext-Expanded+RNA Panel. The report date is 04/30/2021.   The CancerNext-Expanded + RNAinsight gene panel offered by [employer] and includes sequencing and rearrangement analysis for the following 77 genes: IP, ALK, APC*, ATM*, AXIN2, BAP1, BARD1, BLM, BMPR1A, BRCA1*, BRCA2*, BRIP1*, CDC73, CDH1*,CDK4, CDKN1B, CDKN2A, CHEK2*, CTNNA1, DICER1, FANCC, FH, FLCN, GALNT12, KIF1B, LZTR1, MAX, MEN1, MET, MLH1*, MSH2*, MSH3, MSH6*, MUTYH*, NBN, NF1*, NF2, NTHL1, PALB2*, PHOX2B, PMS2*, POT1, PRKAR1A, PTCH1, PTEN*, RAD51C*, RAD51D*,RB1, RECQL, RET, SDHA, SDHAF2, SDHB, SDHC, SDHD, SMAD4, SMARCA4, SMARCB1, SMARCE1, STK11, SUFU, TMEM127, TP53*,TSC1, TSC2, VHL and XRCC2 (sequencing and deletion/duplication);  EGFR, EGLN1, HOXB13, KIT, MITF, PDGFRA, POLD1 and POLE (sequencing only); EPCAM and GREM1 (deletion/duplication only).   05/25/2021 Cancer Staging   Staging form: Breast, AJCC 8th Edition - Pathologic: Stage IA (pT1c, pN0, cM0, G2, ER+, PR-, HER2-) - Signed by Cammie Sickle, MD on 05/25/2021 Histologic grading system: 3 grade system     HISTORY OF PRESENTING ILLNESS: Ambulating independently.  Alone.  Meghan Harper 65 y.o.  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  Episodes of  left thigh Muscle ache especially in the morning an late evening for the past month.  This is not worse with movement.  In fact gets better.  Patient denies any worsening hot flashes or joint pains.  Denies any nausea vomiting abdominal pain.  Review of Systems  Constitutional:  Negative for chills, diaphoresis, fever, malaise/fatigue and weight loss.  HENT:  Negative for nosebleeds and sore throat.   Eyes:  Negative for double vision.  Respiratory:  Negative for cough, hemoptysis, sputum production, shortness of breath and wheezing.   Cardiovascular:  Negative for chest pain, palpitations, orthopnea and leg swelling.  Gastrointestinal:  Negative for abdominal pain, blood in stool, constipation, diarrhea, heartburn, melena, nausea and vomiting.  Genitourinary:  Negative for dysuria, frequency and urgency.  Musculoskeletal:  Negative for back pain and joint pain.  Skin: Negative.  Negative for itching and rash.  Neurological:  Negative for dizziness, tingling, focal weakness, weakness and headaches.  Endo/Heme/Allergies:  Does not bruise/bleed easily.  Psychiatric/Behavioral:  Negative for depression. The patient is not nervous/anxious and does not have insomnia.      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 Excised: 04/21/2010   Personal history of radiation therapy    Pre-diabetes    Wears contact lenses     SURGICAL HISTORY: Past Surgical History:  Procedure Laterality Date   BREAST BIOPSY Right 1987, 2013   fibroadenoma x 2   BREAST BIOPSY Right 04/06/2021   u/s bx-"venus" clip-positive   BREAST EXCISIONAL BIOPSY Left 1978   BREAST FIBROADENOMA  SURGERY     BREAST LUMPECTOMY WITH SENTINEL LYMPH NODE BIOPSY Right 05/08/2021   Procedure: BREAST LUMPECTOMY WITH SENTINEL LYMPH NODE BX;  Surgeon: Robert Bellow, MD;  Location: [facility];  Service: General;  Laterality: Right;   CARPAL TUNNEL RELEASE Right 08/28/2017   Procedure: CARPAL TUNNEL RELEASE ENDOSCOPIC;  Surgeon: Corky Mull, MD;  Location: [facility];  Service: Orthopedics;  Laterality: Right;   CARPAL TUNNEL RELEASE Left 11/06/2017   Procedure: CARPAL TUNNEL RELEASE ENDOSCOPIC;  Surgeon: Corky Mull, MD;  Location: [facility];  Service: Orthopedics;  Laterality: Left;   COLONOSCOPY N/A 09/26/2015   Procedure: COLONOSCOPY;  Surgeon: Manya Silvas, MD;  [facility];  Service: Endoscopy;  Laterality: N/A;   COLONOSCOPY WITH PROPOFOL N/A 04/16/2022   Procedure: COLONOSCOPY WITH PROPOFOL;  Surgeon: Lesly Rubenstein, MD;  Location: [facility] ENDOSCOPY;  Service: Endoscopy;  Laterality: N/A;   TOOTH EXTRACTION      SOCIAL HISTORY: Social History  Socioeconomic History   Marital status: Single    Spouse name: Not on file   Number of children: Not on file   Years of education: Not on file   Highest education level: Not on file  Occupational History   Not on file  Tobacco Use   Smoking status: Never   Smokeless tobacco: Never  Vaping Use   Vaping Use: Never used  Substance and Sexual Activity   Alcohol use: Yes    Alcohol/week: 0.0 standard drinks of alcohol    Comment: may have a drink 3-4x/yr   Drug use: No   Sexual activity: Not on file  Other Topics Concern   Not on file  Social History Narrative   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 No smoking; lives in [place]; works in [place].    Social Determinants of Health   Financial Resource Strain: Not on file  Food Insecurity: Not on file  Transportation Needs: Not on file  Physical Activity: Not on file  Stress: Not on file  Social Connections: Not on file  Intimate  Partner Violence: Not on file    FAMILY HISTORY: Family History  Problem Relation Age of Onset   Colon cancer Mother        dx 58s   Other Brother        CLL and skin cancers   Breast cancer Maternal Aunt 60   Lung cancer Maternal Aunt    Breast cancer Paternal Aunt 9   Pancreatic cancer Paternal Aunt    Melanoma Maternal Uncle    Breast cancer Niece        dx 30s, "BRCA pos"    ALLERGIES:  is allergic to wound dressing adhesive and meloxicam.  MEDICATIONS:  Current Outpatient Medications  Medication Sig Dispense Refill   anastrozole (ARIMIDEX) 1 MG tablet TAKE ONE TABLET BY MOUTH DAILY 90 tablet 1   Calcium Carb-Cholecalciferol (CALCIUM 600 + D PO) Take 1 tablet by mouth 2 (two) times daily.     cetirizine (ZYRTEC) 10 MG tablet Take 10 mg by mouth daily.     CO-ENZYME Q-10 PO Take 100 mg by mouth daily.     fexofenadine (ALLEGRA) 60 MG tablet Take 60 mg by mouth daily.     ibuprofen (ADVIL) 200 MG tablet Take 400 mg by mouth every 6 (six) hours as needed for mild pain.     Krill Oil 350 MG CAPS Take 350 mg by mouth daily.     Multiple Vitamin (MULTIVITAMIN) capsule Take 1 capsule by mouth daily. Centrum Silver     Pamabrom 50 MG CAPS Take 50 mg by mouth daily as needed (water gain).     Phenylephrine-Acetaminophen (TYLENOL SINUS+HEADACHE) 5-325 MG TABS Take 1 tablet by mouth daily as needed (Headache).     Polyvinyl Alcohol-Povidone (CLEAR EYES ALL SEASONS) 5-6 MG/ML SOLN Place 1 drop into both eyes daily as needed (Dry eyes).     rosuvastatin (CRESTOR) 5 MG tablet Take 5 mg by mouth daily.     COLLAGEN PO Take by mouth. (Patient not taking: Reported on 08/10/2022)     Glucosamine-Chondroitin (MOVE FREE PO) Take 1 tablet by mouth daily. (Patient not taking: Reported on 02/07/2022)     ipratropium (ATROVENT) 0.02 % nebulizer solution Take 0.5 mg by nebulization 4 (four) times daily. (Patient not taking: Reported on 08/10/2022)     Melatonin 10 MG TABS Take 1 tablet by mouth at  bedtime as needed. (Patient not taking: Reported on 02/07/2022)  Probiotic Product (PROBIOTIC-10 PO) Take 1 tablet by mouth daily. (Patient not taking: Reported on 11/06/2021)     No current facility-administered medications for this visit.      Marland Kitchen  PHYSICAL EXAMINATION: ECOG PERFORMANCE STATUS: 0 - Asymptomatic  Vitals:   08/10/22 1000  BP: (!) 147/79  Pulse: 69  Resp: 16  Temp: 98.8 F (37.1 C)   Filed Weights   08/10/22 1000  Weight: 191 lb (86.6 kg)    Physical Exam Vitals and nursing note reviewed.  Constitutional:      Comments: Ambulating: Independently;  With family-husband.  HENT:     Head: Normocephalic and atraumatic.     Mouth/Throat:     Pharynx: Oropharynx is clear.  Eyes:     Extraocular Movements: Extraocular movements intact.     Pupils: Pupils are equal, round, and reactive to light.  Cardiovascular:     Rate and Rhythm: Normal rate and regular rhythm.  Pulmonary:     Comments: Decreased breath sounds bilaterally.  Abdominal:     Palpations: Abdomen is soft.  Musculoskeletal:        General: Normal range of motion.     Cervical back: Normal range of motion.  Skin:    General: Skin is warm.  Neurological:     General: No focal deficit present.     Mental Status: She is alert and oriented to person, place, and time.  Psychiatric:        Behavior: Behavior normal.        Judgment: Judgment normal.      LABORATORY DATA:  I have reviewed the data as listed Lab Results  Component Value Date   WBC 5.0 07/17/2021   HGB 13.6 07/17/2021   HCT 40.4 07/17/2021   MCV 87.6 07/17/2021   PLT 208 07/17/2021   No results for input(s): "NA", "K", "CL", "CO2", "GLUCOSE", "BUN", "CREATININE", "CALCIUM", "GFRNONAA", "GFRAA", "PROT", "ALBUMIN", "AST", "ALT", "ALKPHOS", "BILITOT", "BILIDIR", "IBILI" in the last 8760 hours.   RADIOGRAPHIC STUDIES: I have personally reviewed the radiological images as listed and agreed with the findings in the  report. No results found.  ASSESSMENT & PLAN:   Carcinoma of upper-inner quadrant of right breast in female, estrogen receptor positive ([place]) #Stage I T1c- ER positive; PR negative; her 2 negative; s/p lumpectomy clear margins [Dr,Byrnett]. ONCOTYPE-LOW RISK- s/p RT. June 2022. MAy 2023- Bil Mamm-WNL  # ON anastrazole- tolerating well except for hot flashes- see below.  #Hot flashes grade 1-recommend monitor for now; STABLE  # left Thigh pain: ? Postural -20m; dull ache-suspect muscular sprain. No concern for any lumbar spine involvement or arthritis.  #Osteoporosis screening: BMD- NOV 2022- [Dr.Moriyati; [facility]]-November 2022 bone density t score- -1.4 consistent with osteopenia. Reviewed the results for the patient. Continue Calcium plus vitamin D; weightbearing exercises.  Defer to PCP re: repeat in 2 years/ and need for possible intervention  # DISPOSITION: # Follow up in 6 months-MD; cbc/cmp; vit D 25 -OH levels-;-Dr.B    All questions were answered. The patient/family knows to call the clinic with any problems, questions or concerns.    Cammie Sickle, MD 08/10/2022 2:46 PM

## 2022-08-10 NOTE — Assessment & Plan Note (Addendum)
#  Stage I T1c- ER positive; PR negative; her 2 negative; s/p lumpectomy clear margins [Dr,Byrnett]. ONCOTYPE-LOW RISK- s/p RT. June 2022. MAy 2023- Bil Mamm-WNL  # ON anastrazole- tolerating well except for hot flashes- see below.  #Hot flashes grade 1-recommend monitor for now; STABLE  # left Thigh pain: ? Postural -58m dull ache-suspect muscular sprain. No concern for any lumbar spine involvement or arthritis.  Recommend NSAIDs like Tylenol as needed.  #Osteoporosis screening: BMD- NOV 2022- [Dr.Moriyati;  [facility]]-November 2022 bone density t score- -1.4 consistent with osteopenia. Reviewed the results for the patient. Continue Calcium plus vitamin D; weightbearing exercises.  Defer to PCP re: repeat in 2 years/ and need for possible intervention  # DISPOSITION: # Follow up in 6 months-MD; cbc/cmp; vit D 25 -OH levels-;-Dr.B

## 2022-08-21 ENCOUNTER — Ambulatory Visit: Payer: Federal, State, Local not specified - PPO | Admitting: Radiation Oncology

## 2022-09-17 ENCOUNTER — Ambulatory Visit
Admission: RE | Admit: 2022-09-17 | Discharge: 2022-09-17 | Disposition: A | Discharge status: Home / Self Care | Payer: Federal, State, Local not specified - PPO | Source: Ambulatory Visit | Attending: Radiation Oncology | Admitting: Radiation Oncology

## 2022-09-17 ENCOUNTER — Encounter: Payer: Self-pay | Admitting: Radiation Oncology

## 2022-09-17 VITALS — BP 147/78 | HR 98 | Temp 98.9°F | Resp 16 | Ht 63.5 in | Wt 190.4 lb

## 2022-09-17 DIAGNOSIS — C50211 Malignant neoplasm of upper-inner quadrant of right female breast: Secondary | ICD-10-CM | POA: Diagnosis present

## 2022-09-17 DIAGNOSIS — Z79811 Long term (current) use of aromatase inhibitors: Secondary | ICD-10-CM | POA: Insufficient documentation

## 2022-09-17 DIAGNOSIS — Z170 Estrogen receptor positive status [ER+]: Secondary | ICD-10-CM | POA: Insufficient documentation

## 2022-09-17 DIAGNOSIS — Z923 Personal history of irradiation: Secondary | ICD-10-CM | POA: Diagnosis not present

## 2022-09-17 NOTE — Progress Notes (Signed)
Radiation Oncology Follow up Note  Name: Meghan Harper   Date:   09/17/2022 MRN:  [id number] DOB: [birth year]    This 66 y.o. female presents to the clinic today for 70-monthfollow-up status post whole breast radiation to her right breast for stage Ia ER positive invasive mammary carcinoma.  REFERRING PROVIDER: MMarinda Elk MD  HPI: Patient is a 66year old female now at 18 months having completed whole breast radiation to her right breast for stage Ia ER positive invasive mammary carcinoma.  Seen today in routine follow-up she is doing well.  She specifically denies breast tenderness cough or bone pain..  She mammogram back in May which I have reviewed was BI-RADS 2 benign.  She is currently on Arimidex tolerating it well without side effect.  COMPLICATIONS OF TREATMENT: none  FOLLOW UP COMPLIANCE: keeps appointments   PHYSICAL EXAM:  BP (!) 147/78 (BP Location: Right Arm, Patient Position: Sitting, Cuff Size: Normal)   Pulse 98   Temp 98.9 F (37.2 C) (Tympanic)   Resp 16   Ht 5' 3.5" (1.613 m) Comment: stated HT  Wt 190 lb 6.4 oz (86.4 kg)   BMI 33.20 kg/m  Lungs are clear to A&P cardiac examination essentially unremarkable with regular rate and rhythm. No dominant mass or nodularity is noted in either breast in 2 positions examined. Incision is well-healed. No axillary or supraclavicular adenopathy is appreciated. Cosmetic result is excellent.  Well-developed well-nourished patient in NAD. HEENT reveals PERLA, EOMI, discs not visualized.  Oral cavity is clear. No oral mucosal lesions are identified. Neck is clear without evidence of cervical or supraclavicular adenopathy. Lungs are clear to A&P. Cardiac examination is essentially unremarkable with regular rate and rhythm without murmur rub or thrill. Abdomen is benign with no organomegaly or masses noted. Motor sensory and DTR levels are equal and symmetric in the upper and lower extremities. Cranial nerves II through XII  are grossly intact. Proprioception is intact. No peripheral adenopathy or edema is identified. No motor or sensory levels are noted. Crude visual fields are within normal range.  Mama  RADIOLOGY RESULTS: Grams reviewed compatible with above-stated findings  PLAN: Present time patient is now out over 18 months with no evidence of disease on pleased with her overall progress.  I have asked to see her back in 1 year for follow-up.  She continues on Arimidex without side effect.  Patient knows to call with any concerns.  I would like to take this opportunity to thank you for allowing me to participate in the care of your patient..Noreene Filbert MD

## 2023-01-17 ENCOUNTER — Other Ambulatory Visit: Payer: Self-pay | Admitting: Surgery

## 2023-01-17 DIAGNOSIS — Z853 Personal history of malignant neoplasm of breast: Secondary | ICD-10-CM

## 2023-01-19 ENCOUNTER — Other Ambulatory Visit: Payer: Self-pay | Admitting: Internal Medicine

## 2023-02-08 ENCOUNTER — Inpatient Hospital Stay: Payer: Federal, State, Local not specified - PPO | Attending: Internal Medicine

## 2023-02-08 ENCOUNTER — Inpatient Hospital Stay (HOSPITAL_BASED_OUTPATIENT_CLINIC_OR_DEPARTMENT_OTHER): Payer: Federal, State, Local not specified - PPO | Admitting: Internal Medicine

## 2023-02-08 ENCOUNTER — Encounter: Payer: Self-pay | Admitting: Internal Medicine

## 2023-02-08 VITALS — BP 133/76 | HR 67 | Temp 97.9°F | Resp 20 | Wt 190.8 lb

## 2023-02-08 DIAGNOSIS — Z170 Estrogen receptor positive status [ER+]: Secondary | ICD-10-CM

## 2023-02-08 DIAGNOSIS — M8580 Other specified disorders of bone density and structure, unspecified site: Secondary | ICD-10-CM | POA: Diagnosis not present

## 2023-02-08 DIAGNOSIS — Z79811 Long term (current) use of aromatase inhibitors: Secondary | ICD-10-CM | POA: Insufficient documentation

## 2023-02-08 DIAGNOSIS — C50211 Malignant neoplasm of upper-inner quadrant of right female breast: Secondary | ICD-10-CM | POA: Insufficient documentation

## 2023-02-08 LAB — VITAMIN D 25 HYDROXY (VIT D DEFICIENCY, FRACTURES): Vit D, 25-Hydroxy: 49.71 ng/mL (ref 30–100)

## 2023-02-08 LAB — CBC WITH DIFFERENTIAL/PLATELET
Abs Immature Granulocytes: 0.01 10*3/uL (ref 0.00–0.07)
Basophils Absolute: 0.1 10*3/uL (ref 0.0–0.1)
Basophils Relative: 1 %
Eosinophils Absolute: 0.2 10*3/uL (ref 0.0–0.5)
Eosinophils Relative: 2 %
HCT: 36.8 % (ref 36.0–46.0)
Hemoglobin: 12.3 g/dL (ref 12.0–15.0)
Immature Granulocytes: 0 %
Lymphocytes Relative: 23 %
Lymphs Abs: 1.5 10*3/uL (ref 0.7–4.0)
MCH: 29.6 pg (ref 26.0–34.0)
MCHC: 33.4 g/dL (ref 30.0–36.0)
MCV: 88.7 fL (ref 80.0–100.0)
Monocytes Absolute: 0.6 10*3/uL (ref 0.1–1.0)
Monocytes Relative: 9 %
Neutro Abs: 4.3 10*3/uL (ref 1.7–7.7)
Neutrophils Relative %: 65 %
Platelets: 235 10*3/uL (ref 150–400)
RBC: 4.15 MIL/uL (ref 3.87–5.11)
RDW: 11.9 % (ref 11.5–15.5)
WBC: 6.6 10*3/uL (ref 4.0–10.5)
nRBC: 0 % (ref 0.0–0.2)

## 2023-02-08 LAB — COMPREHENSIVE METABOLIC PANEL
ALT: 23 U/L (ref 0–44)
AST: 24 U/L (ref 15–41)
Albumin: 4.1 g/dL (ref 3.5–5.0)
Alkaline Phosphatase: 54 U/L (ref 38–126)
Anion gap: 7 (ref 5–15)
BUN: 18 mg/dL (ref 8–23)
CO2: 26 mmol/L (ref 22–32)
Calcium: 9.1 mg/dL (ref 8.9–10.3)
Chloride: 106 mmol/L (ref 98–111)
Creatinine, Ser: 0.67 mg/dL (ref 0.44–1.00)
GFR, Estimated: >60 mL/min (ref >60)
Glucose, Bld: 91 mg/dL (ref 70–99)
Potassium: 4.5 mmol/L (ref 3.5–5.1)
Sodium: 139 mmol/L (ref 135–145)
Total Bilirubin: 0.5 mg/dL (ref 0.3–1.2)
Total Protein: 7.1 g/dL (ref 6.5–8.1)

## 2023-02-08 NOTE — Progress Notes (Signed)
[facility] CONSULT NOTE  Patient Care Team: Patrice Paradise, MD as PCP - General (Physician Assistant) Scarlett Presto, RN (Inactive) as Oncology Nurse Navigator Carmina Miller, MD as Consulting Physician (Radiation Oncology) Earna Coder, MD as Consulting Physician (Internal Medicine) Earline Mayotte, MD as Consulting Physician (General Surgery)  CHIEF COMPLAINTS/PURPOSE OF CONSULTATION: Breast cancer  Oncology History Overview Note  # June 2022- Targeted ultrasound was performed of the RIGHT upper inner breast. At 1:30 8 cm from the nipple, there is an irregular hypoechoic mass with indistinct margins. It measures approximately 10 x 5 x 6 mm. This corresponds to the site of screening mammographic concern.   Targeted ultrasound was performed of the RIGHT axilla. No suspicious axillary lymph nodes are seen.   IMPRESSION: 1. There is a 10 mm mass in the RIGHT upper inner breast which is concerning for malignancy. Recommend ultrasound-guided biopsy for definitive characterization.  DIAGNOSIS:  A. BREAST, RIGHT, 1:30 O'CLOCK 8 CM FROM NIPPLE; ULTRASOUND-GUIDED CORE  BIOPSY:  - INVASIVE MAMMARY CARCINOMA, NO SPECIAL TYPE.   Size of invasive carcinoma: 7 mm in this sample  Histologic grade of invasive carcinoma: Grade 2                       Glandular/tubular differentiation score: 3                       Nuclear pleomorphism score: 2                       Mitotic rate score: 1                       Total score: 6  Ductal carcinoma in situ: Present, intermediate grade  Lymphovascular invasion: Not identified   CASE SUMMARY: BREAST BIOMARKER TESTS  Estrogen Receptor (ER) Status: POSITIVE          Percentage of cells with nuclear positivity: Greater than 90%          Average intensity of staining: Strong   Progesterone Receptor (PgR) Status: NEGATIVE (less than 1%)          Internal control cells present and stain as expected   HER2 (by  immunohistochemistry): NEGATIVE (Score 0)  TNM Descriptors: Not applicable  pT1c  Regional Lymph Nodes Modifier: Not applicable  pN0  pM - Not applicable  #  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   #Right breast cancer status postlumpectomy-Oncotype low risk; no chemotherapy.  # NOV 14th, 2022-start anastrozole for 5 years.   Carcinoma of upper-inner quadrant of right breast in female, estrogen receptor positive  04/18/2021 Initial Diagnosis   Carcinoma of upper-inner quadrant of right breast in female, estrogen receptor positive (HCC)    Genetic Testing   Negative genetic testing. No pathogenic variants identified on the [employer] CancerNext-Expanded+RNA Panel. The report date is 04/30/2021.   The CancerNext-Expanded + RNAinsight gene panel offered by [employer] and includes sequencing and rearrangement analysis for the following 77 genes: IP, ALK, APC*, ATM*, AXIN2, BAP1, BARD1, BLM, BMPR1A, BRCA1*, BRCA2*, BRIP1*, CDC73, CDH1*,CDK4, CDKN1B, CDKN2A, CHEK2*, CTNNA1, DICER1, FANCC, FH, FLCN, GALNT12, KIF1B, LZTR1, MAX, MEN1, MET, MLH1*, MSH2*, MSH3, MSH6*, MUTYH*, NBN, NF1*, NF2, NTHL1, PALB2*, PHOX2B, PMS2*, POT1, PRKAR1A, PTCH1, PTEN*, RAD51C*, RAD51D*,RB1, RECQL, RET, SDHA, SDHAF2, SDHB, SDHC, SDHD, SMAD4, SMARCA4, SMARCB1, SMARCE1, STK11, SUFU, TMEM127, TP53*,TSC1, TSC2, VHL and XRCC2 (sequencing and deletion/duplication); EGFR, EGLN1, HOXB13,  KIT, MITF, PDGFRA, POLD1 and POLE (sequencing only); EPCAM and GREM1 (deletion/duplication only).   05/25/2021 Cancer Staging   Staging form: Breast, AJCC 8th Edition - Pathologic: Stage IA (pT1c, pN0, cM0, G2, ER+, PR-, HER2-) - Signed by Earna Coder, MD on 05/25/2021 Histologic grading system: 3 grade system     HISTORY OF PRESENTING ILLNESS: Ambulating independently.  Alone.  Meghan Harper 67 y.o.  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  Episodes of left  thigh Muscle ache especially in the morning an late evening for the past month.  This is not worse with movement.  In fact gets better.  Patient denies any worsening hot flashes or joint pains.  Denies any nausea vomiting abdominal pain.  Review of Systems  Constitutional:  Negative for chills, diaphoresis, fever, malaise/fatigue and weight loss.  HENT:  Negative for nosebleeds and sore throat.   Eyes:  Negative for double vision.  Respiratory:  Negative for cough, hemoptysis, sputum production, shortness of breath and wheezing.   Cardiovascular:  Negative for chest pain, palpitations, orthopnea and leg swelling.  Gastrointestinal:  Negative for abdominal pain, blood in stool, constipation, diarrhea, heartburn, melena, nausea and vomiting.  Genitourinary:  Negative for dysuria, frequency and urgency.  Musculoskeletal:  Negative for back pain and joint pain.  Skin: Negative.  Negative for itching and rash.  Neurological:  Negative for dizziness, tingling, focal weakness, weakness and headaches.  Endo/Heme/Allergies:  Does not bruise/bleed easily.  Psychiatric/Behavioral:  Negative for depression. The patient is not nervous/anxious and does not have insomnia.      MEDICAL HISTORY:  Past Medical History:  Diagnosis Date   Arthritis    Breast cancer 2022   Family history of breast cancer    Family history of CLL (chronic lymphoid leukemia)    Family history of colon cancer    Family history of melanoma    Family history of pancreatic cancer    Melanoma in situ 04/14/2010   Right upper back. 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 Excised: 04/21/2010   Personal history of radiation therapy    Pre-diabetes    Wears contact lenses     SURGICAL HISTORY: Past Surgical History:  Procedure Laterality Date   BREAST BIOPSY Right 1987, 2013   fibroadenoma x 2   BREAST BIOPSY Right 04/06/2021   u/s bx-"venus" clip-positive   BREAST EXCISIONAL BIOPSY Left 1978   BREAST FIBROADENOMA SURGERY     BREAST  LUMPECTOMY WITH SENTINEL LYMPH NODE BIOPSY Right 05/08/2021   Procedure: BREAST LUMPECTOMY WITH SENTINEL LYMPH NODE BX;  Surgeon: Earline Mayotte, MD;  Location: [facility];  Service: General;  Laterality: Right;   CARPAL TUNNEL RELEASE Right 08/28/2017   Procedure: CARPAL TUNNEL RELEASE ENDOSCOPIC;  Surgeon: Christena Flake, MD;  Location: [facility];  Service: Orthopedics;  Laterality: Right;   CARPAL TUNNEL RELEASE Left 11/06/2017   Procedure: CARPAL TUNNEL RELEASE ENDOSCOPIC;  Surgeon: Christena Flake, MD;  Location: [facility];  Service: Orthopedics;  Laterality: Left;   COLONOSCOPY N/A 09/26/2015   Procedure: COLONOSCOPY;  Surgeon: Scot Jun, MD;  Location: [facility];  Service: Endoscopy;  Laterality: N/A;   COLONOSCOPY WITH PROPOFOL N/A 04/16/2022   Procedure: COLONOSCOPY WITH PROPOFOL;  Surgeon: Regis Bill, MD;  Location: [facility] END[facility];  Service: Endoscopy;  Laterality: N/A;   TOOTH EXTRACTION      SOCIAL HISTORY: Social History   Socioeconomic History  Marital status: Single    Spouse name: Not on file   Number of children: Not on file   Years of education: Not on file   Highest education level: Not on file  Occupational History   Not on file  Tobacco Use   Smoking status: Never   Smokeless tobacco: Never  Vaping Use   Vaping Use: Never used  Substance and Sexual Activity   Alcohol use: Yes    Alcohol/week: 0.0 standard drinks of alcohol    Comment: may have a drink 3-4x/yr   Drug use: No   Sexual activity: Not on file  Other Topics Concern   Not on file  Social History Narrative   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 No smoking; lives in [place]; works in [employer].    Social Determinants of Health   Financial Resource Strain: Not on file  Food Insecurity: Not on file  Transportation Needs: Not on file  Physical Activity: Not on file  Stress: Not on file  Social Connections: Not on file  Intimate Partner Violence: Not on  file    FAMILY HISTORY: Family History  Problem Relation Age of Onset   Colon cancer Mother        dx 76s   Other Brother        CLL and skin cancers   Breast cancer Maternal Aunt 60   Lung cancer Maternal Aunt    Breast cancer Paternal Aunt 34   Pancreatic cancer Paternal Aunt    Melanoma Maternal Uncle    Breast cancer Niece        dx 30s, "BRCA pos"    ALLERGIES:  is allergic to wound dressing adhesive and meloxicam.  MEDICATIONS:  Current Outpatient Medications  Medication Sig Dispense Refill   anastrozole (ARIMIDEX) 1 MG tablet TAKE 1 TABLET BY MOUTH DAILY 90 tablet 1   Calcium Carb-Cholecalciferol (CALCIUM 600 + D PO) Take 1 tablet by mouth 2 (two) times daily.     cetirizine (ZYRTEC) 10 MG tablet Take 10 mg by mouth daily.     CO-ENZYME Q-10 PO Take 100 mg by mouth daily.     Glucosamine-Chondroitin (MOVE FREE PO) Take 1 tablet by mouth daily.     ibuprofen (ADVIL) 200 MG tablet Take 400 mg by mouth every 6 (six) hours as needed for mild pain.     Krill Oil 350 MG CAPS Take 350 mg by mouth daily.     levocetirizine (XYZAL) 2.5 MG/5ML solution Take 2.5 mg by mouth every evening.     Misc Natural Products (AIRBORNE ELDERBERRY PO) Take by mouth.     Multiple Vitamin (MULTIVITAMIN) capsule Take 1 capsule by mouth daily. Centrum Silver     Pamabrom 50 MG CAPS Take 50 mg by mouth daily as needed (water gain).     Phenylephrine-Acetaminophen (TYLENOL SINUS+HEADACHE) 5-325 MG TABS Take 1 tablet by mouth daily as needed (Headache).     Polyvinyl Alcohol-Povidone (CLEAR EYES ALL SEASONS) 5-6 MG/ML SOLN Place 1 drop into both eyes daily as needed (Dry eyes).     Probiotic Product (PROBIOTIC-10 PO) Take 1 tablet by mouth daily.     rosuvastatin (CRESTOR) 5 MG tablet Take 5 mg by mouth daily.     Turmeric (QC TUMERIC COMPLEX PO) Take by mouth.     COLLAGEN PO Take by mouth. (Patient not taking: Reported on 08/10/2022)     No current facility-administered medications for this  visit.      Marland Kitchen  PHYSICAL EXAMINATION:  ECOG PERFORMANCE STATUS: 0 - Asymptomatic  Vitals:   02/08/23 1418  BP: 133/76  Pulse: 67  Resp: 20  Temp: 97.9 F (36.6 C)  SpO2: 100%   Filed Weights   02/08/23 1418  Weight: 190 lb 12.8 oz (86.5 kg)    Physical Exam Vitals and nursing note reviewed.  HENT:     Head: Normocephalic and atraumatic.     Mouth/Throat:     Pharynx: Oropharynx is clear.  Eyes:     Extraocular Movements: Extraocular movements intact.     Pupils: Pupils are equal, round, and reactive to light.  Cardiovascular:     Rate and Rhythm: Normal rate and regular rhythm.  Pulmonary:     Comments: Decreased breath sounds bilaterally.  Abdominal:     Palpations: Abdomen is soft.  Musculoskeletal:        General: Normal range of motion.     Cervical back: Normal range of motion.  Skin:    General: Skin is warm.  Neurological:     General: No focal deficit present.     Mental Status: Meghan Harper is alert and oriented to person, place, and time.  Psychiatric:        Behavior: Behavior normal.        Judgment: Judgment normal.      LABORATORY DATA:  I have reviewed the data as listed Lab Results  Component Value Date   WBC 6.6 02/08/2023   HGB 12.3 02/08/2023   HCT 36.8 02/08/2023   MCV 88.7 02/08/2023   PLT 235 02/08/2023   Recent Labs    02/08/23 1403  NA 139  K 4.5  CL 106  CO2 26  GLUCOSE 91  BUN 18  CREATININE 0.67  CALCIUM 9.1  GFRNONAA >60  PROT 7.1  ALBUMIN 4.1  AST 24  ALT 23  ALKPHOS 54  BILITOT 0.5     RADIOGRAPHIC STUDIES: I have personally reviewed the radiological images as listed and agreed with the findings in the report. No results found.  ASSESSMENT & PLAN:   Carcinoma of upper-inner quadrant of right breast in female, estrogen receptor positive (HCC) #Stage I T1c- ER positive; PR negative; her 2 negative; s/p lumpectomy clear margins [Dr,Byrnett/ Dr.Sam ]. ONCOTYPE-LOW RISK- s/p RT. June 2022. MAy 2023- Bil Mamm-WNL;  stable.   # ON anastrazole- tolerating well except for hot flashes- see below.  #Hot flashes grade 1-recommend monitor for now; stable.   # left Thigh pain: ? Postural -60m; dull ache-suspect muscular sprain. No concern for any lumbar spine involvement or arthritis- stable/resolved;   #Osteoporosis screening: BMD- NOV 2022- [Dr.Moriyati; [facility]]-November 2022 bone density t score- -1.4 consistent with osteopenia.  Continue Calcium plus vitamin D; weightbearing exercises.  Will repeat BMD  in NOV 2024.    # DISPOSITION: # Follow up in 6 months-MD; cbc/cmp; BMD -Dr.B   All questions were answered. The patient/family knows to call the clinic with any problems, questions or concerns.    Earna Coder, MD 02/08/2023 2:59 PM

## 2023-02-08 NOTE — Progress Notes (Signed)
Patient has no concerns 

## 2023-02-08 NOTE — Assessment & Plan Note (Addendum)
#  Stage I T1c- ER positive; PR negative; her 2 negative; s/p lumpectomy clear margins [Dr,Byrnett/ Dr.Sam ]. ONCOTYPE-LOW RISK- s/p RT. June 2022. MAy 2023- Bil Mamm-WNL; stable.   # ON anastrazole- tolerating well except for hot flashes- see below.  #Hot flashes grade 1-recommend monitor for now; stable.   # left Thigh pain: ? Postural -64m; dull ache-suspect muscular sprain. No concern for any lumbar spine involvement or arthritis- stable/resolved;   #Osteoporosis screening: BMD- NOV 2022- [Dr.Moriyati; [facility]]-November 2022 bone density t score- -1.4 consistent with osteopenia.  Continue Calcium plus vitamin D; weightbearing exercises.  Will repeat BMD  in NOV 2024.    # DISPOSITION: # Follow up in 6 months-MD; cbc/cmp; BMD -Dr.B

## 2023-03-08 ENCOUNTER — Other Ambulatory Visit: Payer: Self-pay | Admitting: General Surgery

## 2023-03-08 DIAGNOSIS — Z853 Personal history of malignant neoplasm of breast: Secondary | ICD-10-CM

## 2023-03-11 ENCOUNTER — Ambulatory Visit
Admission: RE | Admit: 2023-03-11 | Discharge: 2023-03-11 | Disposition: A | Discharge status: Home / Self Care | Payer: Federal, State, Local not specified - PPO | Source: Ambulatory Visit | Attending: Surgery | Admitting: Surgery

## 2023-03-11 DIAGNOSIS — Z853 Personal history of malignant neoplasm of breast: Secondary | ICD-10-CM

## 2023-07-20 ENCOUNTER — Other Ambulatory Visit: Payer: Self-pay | Admitting: Internal Medicine

## 2023-07-23 ENCOUNTER — Other Ambulatory Visit: Payer: Federal, State, Local not specified - PPO

## 2023-07-30 ENCOUNTER — Ambulatory Visit (INDEPENDENT_AMBULATORY_CARE_PROVIDER_SITE_OTHER): Payer: Medicare Other | Admitting: Dermatology

## 2023-07-30 ENCOUNTER — Ambulatory Visit
Admission: RE | Admit: 2023-07-30 | Discharge: 2023-07-30 | Disposition: A | Discharge status: Home / Self Care | Payer: Medicare Other | Source: Ambulatory Visit | Attending: Internal Medicine | Admitting: Internal Medicine

## 2023-07-30 ENCOUNTER — Encounter: Payer: Self-pay | Admitting: Dermatology

## 2023-07-30 VITALS — BP 118/64 | HR 87

## 2023-07-30 DIAGNOSIS — Z86006 Personal history of melanoma in-situ: Secondary | ICD-10-CM

## 2023-07-30 DIAGNOSIS — L821 Other seborrheic keratosis: Secondary | ICD-10-CM

## 2023-07-30 DIAGNOSIS — Z13820 Encounter for screening for osteoporosis: Secondary | ICD-10-CM | POA: Diagnosis not present

## 2023-07-30 DIAGNOSIS — Z780 Asymptomatic menopausal state: Secondary | ICD-10-CM | POA: Insufficient documentation

## 2023-07-30 DIAGNOSIS — Z853 Personal history of malignant neoplasm of breast: Secondary | ICD-10-CM | POA: Insufficient documentation

## 2023-07-30 DIAGNOSIS — W908XXA Exposure to other nonionizing radiation, initial encounter: Secondary | ICD-10-CM

## 2023-07-30 DIAGNOSIS — Z170 Estrogen receptor positive status [ER+]: Secondary | ICD-10-CM | POA: Insufficient documentation

## 2023-07-30 DIAGNOSIS — L578 Other skin changes due to chronic exposure to nonionizing radiation: Secondary | ICD-10-CM

## 2023-07-30 DIAGNOSIS — Z1283 Encounter for screening for malignant neoplasm of skin: Secondary | ICD-10-CM | POA: Diagnosis not present

## 2023-07-30 DIAGNOSIS — C50211 Malignant neoplasm of upper-inner quadrant of right female breast: Secondary | ICD-10-CM | POA: Insufficient documentation

## 2023-07-30 DIAGNOSIS — D1801 Hemangioma of skin and subcutaneous tissue: Secondary | ICD-10-CM

## 2023-07-30 DIAGNOSIS — D225 Melanocytic nevi of trunk: Secondary | ICD-10-CM

## 2023-07-30 DIAGNOSIS — D2271 Melanocytic nevi of right lower limb, including hip: Secondary | ICD-10-CM

## 2023-07-30 DIAGNOSIS — L738 Other specified follicular disorders: Secondary | ICD-10-CM | POA: Diagnosis not present

## 2023-07-30 DIAGNOSIS — L814 Other melanin hyperpigmentation: Secondary | ICD-10-CM

## 2023-07-30 DIAGNOSIS — D229 Melanocytic nevi, unspecified: Secondary | ICD-10-CM

## 2023-07-30 NOTE — Progress Notes (Signed)
   Follow-Up Visit   Subjective  Meghan Harper is a 67 y.o. female who presents for the following: Skin Cancer Screening and Full Body Skin Exam Hx of melanoma in situ,   The patient presents for Total-Body Skin Exam (TBSE) for skin cancer screening and mole check. The patient has spots, moles and lesions to be evaluated, some may be new or changing and the patient may have concern these could be cancer.    The following portions of the chart were reviewed this encounter and updated as appropriate: medications, allergies, medical history  Review of Systems:  No other skin or systemic complaints except as noted in HPI or Assessment and Plan.  Objective  Well appearing patient in no apparent distress; mood and affect are within normal limits.  A full examination was performed including scalp, head, eyes, ears, nose, lips, neck, chest, axillae, abdomen, back, buttocks, bilateral upper extremities, bilateral lower extremities, hands, feet, fingers, toes, fingernails, and toenails. All findings within normal limits unless otherwise noted below.   Relevant physical exam findings are noted in the Assessment and Plan.    Assessment & Plan   SKIN CANCER SCREENING PERFORMED TODAY.  ACTINIC DAMAGE - Chronic condition, secondary to cumulative UV/sun exposure - diffuse scaly erythematous macules with underlying dyspigmentation - Recommend daily broad spectrum sunscreen SPF 30+ to sun-exposed areas, reapply every 2 hours as needed.  - Staying in the shade or wearing long sleeves, sun glasses (UVA+UVB protection) and wide brim hats (4-inch brim around the entire circumference of the hat) are also recommended for sun protection.  - Call for new or changing lesions.  LENTIGINES, SEBORRHEIC KERATOSES, HEMANGIOMAS - Benign normal skin lesions - Benign-appearing - Call for any changes  Sk right medial clavicle 8 x 3 mm waxy tan macule two toned.    MELANOCYTIC NEVI - Tan-brown and/or  pink-flesh-colored symmetric macules and papules - Benign appearing on exam today - Observation - Call clinic for new or changing moles - Recommend daily use of broad spectrum spf 30+ sunscreen to sun-exposed areas.   Nevus Left buttock 7 x 4 mm medium dark brown macule- present for yrs per pt, no changes    Right lower calf  2 mm medium brown macule   Benign-appearing, stable.  Observation.  Call clinic for new or changing moles.  Recommend daily use of broad spectrum spf 30+ sunscreen to sun-exposed areas.   Sebaceous Hyperplasia - Small yellow papules with a central dell - Benign - Observe  HISTORY OF MELANOMA IN SITU At right upper back 03/2010 - No evidence of recurrence today - Recommend regular full body skin exams - Recommend daily broad spectrum sunscreen SPF 30+ to sun-exposed areas, reapply every 2 hours as needed.  - Call if any new or changing lesions are noted between office visits    Return in about 1 year (around 07/29/2024) for TBSE.  I, Asher Muir, CMA, am acting as scribe for Willeen Niece, MD.   Documentation: I have reviewed the above documentation for accuracy and completeness, and I agree with the above.  Willeen Niece, MD

## 2023-07-30 NOTE — Patient Instructions (Signed)

## 2023-08-12 ENCOUNTER — Inpatient Hospital Stay: Payer: Medicare Other | Attending: Internal Medicine

## 2023-08-12 ENCOUNTER — Ambulatory Visit: Payer: Federal, State, Local not specified - PPO

## 2023-08-12 ENCOUNTER — Encounter: Payer: Self-pay | Admitting: Internal Medicine

## 2023-08-12 ENCOUNTER — Inpatient Hospital Stay (HOSPITAL_BASED_OUTPATIENT_CLINIC_OR_DEPARTMENT_OTHER): Payer: Medicare Other | Admitting: Internal Medicine

## 2023-08-12 ENCOUNTER — Other Ambulatory Visit: Payer: Self-pay

## 2023-08-12 ENCOUNTER — Emergency Department
Admission: EM | Admit: 2023-08-12 | Discharge: 2023-08-12 | Disposition: A | Discharge status: Home / Self Care | Payer: Medicare Other | Attending: Emergency Medicine | Admitting: Emergency Medicine

## 2023-08-12 VITALS — BP 140/67 | HR 95 | Temp 99.4°F | Ht 63.5 in | Wt 187.2 lb

## 2023-08-12 DIAGNOSIS — D509 Iron deficiency anemia, unspecified: Secondary | ICD-10-CM | POA: Insufficient documentation

## 2023-08-12 DIAGNOSIS — Z79811 Long term (current) use of aromatase inhibitors: Secondary | ICD-10-CM | POA: Diagnosis not present

## 2023-08-12 DIAGNOSIS — N951 Menopausal and female climacteric states: Secondary | ICD-10-CM | POA: Diagnosis not present

## 2023-08-12 DIAGNOSIS — Z853 Personal history of malignant neoplasm of breast: Secondary | ICD-10-CM | POA: Insufficient documentation

## 2023-08-12 DIAGNOSIS — Z170 Estrogen receptor positive status [ER+]: Secondary | ICD-10-CM

## 2023-08-12 DIAGNOSIS — K580 Irritable bowel syndrome with diarrhea: Secondary | ICD-10-CM | POA: Insufficient documentation

## 2023-08-12 DIAGNOSIS — R531 Weakness: Secondary | ICD-10-CM | POA: Diagnosis present

## 2023-08-12 DIAGNOSIS — C50211 Malignant neoplasm of upper-inner quadrant of right female breast: Secondary | ICD-10-CM | POA: Diagnosis present

## 2023-08-12 DIAGNOSIS — R0602 Shortness of breath: Secondary | ICD-10-CM | POA: Diagnosis not present

## 2023-08-12 DIAGNOSIS — R5383 Other fatigue: Secondary | ICD-10-CM | POA: Insufficient documentation

## 2023-08-12 LAB — CBC WITH DIFFERENTIAL (CANCER CENTER ONLY)
Abs Immature Granulocytes: 0.03 10*3/uL (ref 0.00–0.07)
Basophils Absolute: 0.1 10*3/uL (ref 0.0–0.1)
Basophils Relative: 1 %
Eosinophils Absolute: 0.1 10*3/uL (ref 0.0–0.5)
Eosinophils Relative: 1 %
HCT: 21.8 % — ABNORMAL LOW (ref 36.0–46.0)
Hemoglobin: 6 g/dL — CL (ref 12.0–15.0)
Immature Granulocytes: 0 %
Lymphocytes Relative: 17 %
Lymphs Abs: 1.4 10*3/uL (ref 0.7–4.0)
MCH: 18.5 pg — ABNORMAL LOW (ref 26.0–34.0)
MCHC: 27.5 g/dL — ABNORMAL LOW (ref 30.0–36.0)
MCV: 67.3 fL — ABNORMAL LOW (ref 80.0–100.0)
Monocytes Absolute: 0.7 10*3/uL (ref 0.1–1.0)
Monocytes Relative: 8 %
Neutro Abs: 5.8 10*3/uL (ref 1.7–7.7)
Neutrophils Relative %: 73 %
Platelet Count: 355 10*3/uL (ref 150–400)
RBC: 3.24 MIL/uL — ABNORMAL LOW (ref 3.87–5.11)
RDW: 17.5 % — ABNORMAL HIGH (ref 11.5–15.5)
WBC Count: 8 10*3/uL (ref 4.0–10.5)
nRBC: 0 % (ref 0.0–0.2)

## 2023-08-12 LAB — IRON AND TIBC
Iron: 12 ug/dL — ABNORMAL LOW (ref 28–170)
Iron: 9 ug/dL — ABNORMAL LOW (ref 28–170)
Saturation Ratios: 2 % — ABNORMAL LOW (ref 10.4–31.8)
Saturation Ratios: 2 % — ABNORMAL LOW (ref 10.4–31.8)
TIBC: 556 ug/dL — ABNORMAL HIGH (ref 250–450)
TIBC: 566 ug/dL — ABNORMAL HIGH (ref 250–450)
UIBC: 544 ug/dL
UIBC: 557 ug/dL

## 2023-08-12 LAB — CMP (CANCER CENTER ONLY)
ALT: 18 U/L (ref 0–44)
AST: 19 U/L (ref 15–41)
Albumin: 3.9 g/dL (ref 3.5–5.0)
Alkaline Phosphatase: 50 U/L (ref 38–126)
Anion gap: 7 (ref 5–15)
BUN: 23 mg/dL (ref 8–23)
CO2: 24 mmol/L (ref 22–32)
Calcium: 8.7 mg/dL — ABNORMAL LOW (ref 8.9–10.3)
Chloride: 104 mmol/L (ref 98–111)
Creatinine: 0.6 mg/dL (ref 0.44–1.00)
GFR, Estimated: >60 mL/min (ref >60)
Glucose, Bld: 106 mg/dL — ABNORMAL HIGH (ref 70–99)
Potassium: 4 mmol/L (ref 3.5–5.1)
Sodium: 135 mmol/L (ref 135–145)
Total Bilirubin: 0.3 mg/dL (ref 0.3–1.2)
Total Protein: 6.8 g/dL (ref 6.5–8.1)

## 2023-08-12 LAB — ABO/RH: ABO/RH(D): A NEG

## 2023-08-12 LAB — COMPREHENSIVE METABOLIC PANEL
ALT: 17 U/L (ref 0–44)
AST: 19 U/L (ref 15–41)
Albumin: 3.9 g/dL (ref 3.5–5.0)
Alkaline Phosphatase: 50 U/L (ref 38–126)
Anion gap: 8 (ref 5–15)
BUN: 23 mg/dL (ref 8–23)
CO2: 25 mmol/L (ref 22–32)
Calcium: 9.2 mg/dL (ref 8.9–10.3)
Chloride: 101 mmol/L (ref 98–111)
Creatinine, Ser: 0.55 mg/dL (ref 0.44–1.00)
GFR, Estimated: >60 mL/min (ref >60)
Glucose, Bld: 108 mg/dL — ABNORMAL HIGH (ref 70–99)
Potassium: 4.1 mmol/L (ref 3.5–5.1)
Sodium: 134 mmol/L — ABNORMAL LOW (ref 135–145)
Total Bilirubin: 0.4 mg/dL (ref 0.3–1.2)
Total Protein: 7 g/dL (ref 6.5–8.1)

## 2023-08-12 LAB — RETIC PANEL
Immature Retic Fract: 24.3 % — ABNORMAL HIGH (ref 2.3–15.9)
RBC.: 3.22 MIL/uL — ABNORMAL LOW (ref 3.87–5.11)
Retic Count, Absolute: 62.5 10*3/uL (ref 19.0–186.0)
Retic Ct Pct: 1.9 % (ref 0.4–3.1)
Reticulocyte Hemoglobin: 14.2 pg — ABNORMAL LOW (ref >27.9)

## 2023-08-12 LAB — CBC WITH DIFFERENTIAL/PLATELET
Abs Immature Granulocytes: 0.03 10*3/uL (ref 0.00–0.07)
Basophils Absolute: 0.1 10*3/uL (ref 0.0–0.1)
Basophils Relative: 1 %
Eosinophils Absolute: 0.1 10*3/uL (ref 0.0–0.5)
Eosinophils Relative: 2 %
HCT: 21.8 % — ABNORMAL LOW (ref 36.0–46.0)
Hemoglobin: 5.8 g/dL — ABNORMAL LOW (ref 12.0–15.0)
Immature Granulocytes: 0 %
Lymphocytes Relative: 21 %
Lymphs Abs: 1.6 10*3/uL (ref 0.7–4.0)
MCH: 17.9 pg — ABNORMAL LOW (ref 26.0–34.0)
MCHC: 26.6 g/dL — ABNORMAL LOW (ref 30.0–36.0)
MCV: 67.3 fL — ABNORMAL LOW (ref 80.0–100.0)
Monocytes Absolute: 0.5 10*3/uL (ref 0.1–1.0)
Monocytes Relative: 7 %
Neutro Abs: 5.3 10*3/uL (ref 1.7–7.7)
Neutrophils Relative %: 69 %
Platelets: 350 10*3/uL (ref 150–400)
RBC: 3.24 MIL/uL — ABNORMAL LOW (ref 3.87–5.11)
RDW: 17.6 % — ABNORMAL HIGH (ref 11.5–15.5)
Smear Review: NORMAL
WBC: 7.6 10*3/uL (ref 4.0–10.5)
nRBC: 0 % (ref 0.0–0.2)

## 2023-08-12 LAB — RETICULOCYTES
Immature Retic Fract: 22.6 % — ABNORMAL HIGH (ref 2.3–15.9)
RBC.: 3.21 MIL/uL — ABNORMAL LOW (ref 3.87–5.11)
Retic Count, Absolute: 59.1 10*3/uL (ref 19.0–186.0)
Retic Ct Pct: 1.8 % (ref 0.4–3.1)

## 2023-08-12 LAB — PREPARE RBC (CROSSMATCH)

## 2023-08-12 LAB — LACTATE DEHYDROGENASE
LDH: 127 U/L (ref 98–192)
LDH: 131 U/L (ref 98–192)

## 2023-08-12 LAB — FERRITIN
Ferritin: 2 ng/mL — ABNORMAL LOW (ref 11–307)
Ferritin: 2 ng/mL — ABNORMAL LOW (ref 11–307)

## 2023-08-12 MED ORDER — SODIUM CHLORIDE 0.9 % IV SOLN: 100.0000 mg | Freq: Once | INTRAVENOUS | Status: DC

## 2023-08-12 MED ORDER — SODIUM CHLORIDE 0.9 % IV SOLN
300.0000 mg | Freq: Once | INTRAVENOUS | Status: AC
  Administered 2023-08-12: 300 mg via INTRAVENOUS
  Filled 2023-08-12: qty 300

## 2023-08-12 MED ORDER — SODIUM CHLORIDE 0.9% FLUSH
10.0000 mL | Freq: Two times a day (BID) | INTRAVENOUS | Status: DC
  Administered 2023-08-12: 10 mL via INTRAVENOUS

## 2023-08-12 NOTE — Progress Notes (Signed)
Critical lab received by Enzo Bi in lab. Hgb 6.0. MD notified. There is no chair availability for the next 3 days for blood transfusion. Pt is instructed to drive her car around to the ED as she needs a blood transfusion today.

## 2023-08-12 NOTE — ED Notes (Signed)
 No adverse reaction to blood transfusion at this time. Pt remains on CCM, VS stable and WNL. No needs identified. Call light within reach.

## 2023-08-12 NOTE — ED Notes (Signed)
Blood transfusion complete. No adverse reaction to unit noted.

## 2023-08-12 NOTE — Progress Notes (Signed)
[facility] CONSULT NOTE  Patient Care Team: Patrice Paradise, MD as PCP - General (Physician Assistant) Scarlett Presto, RN (Inactive) as Oncology Nurse Navigator Carmina Miller, MD as Consulting Physician (Radiation Oncology) Earna Coder, MD as Consulting Physician (Internal Medicine) Earline Mayotte, MD as Consulting Physician (General Surgery)  CHIEF COMPLAINTS/PURPOSE OF CONSULTATION: Breast cancer  Oncology History Overview Note  # June 2022- Targeted ultrasound was performed of the RIGHT upper inner breast. At 1:30 8 cm from the nipple, there is an irregular hypoechoic mass with indistinct margins. It measures approximately 10 x 5 x 6 mm. This corresponds to the site of screening mammographic concern.   Targeted ultrasound was performed of the RIGHT axilla. No suspicious axillary lymph nodes are seen.   IMPRESSION: 1. There is a 10 mm mass in the RIGHT upper inner breast which is concerning for malignancy. Recommend ultrasound-guided biopsy for definitive characterization.  DIAGNOSIS:  A. BREAST, RIGHT, 1:30 O'CLOCK 8 CM FROM NIPPLE; ULTRASOUND-GUIDED CORE  BIOPSY:  - INVASIVE MAMMARY CARCINOMA, NO SPECIAL TYPE.   Size of invasive carcinoma: 7 mm in this sample  Histologic grade of invasive carcinoma: Grade 2                       Glandular/tubular differentiation score: 3                       Nuclear pleomorphism score: 2                       Mitotic rate score: 1                       Total score: 6  Ductal carcinoma in situ: Present, intermediate grade  Lymphovascular invasion: Not identified   CASE SUMMARY: BREAST BIOMARKER TESTS  Estrogen Receptor (ER) Status: POSITIVE          Percentage of cells with nuclear positivity: Greater than 90%          Average intensity of staining: Strong   Progesterone Receptor (PgR) Status: NEGATIVE (less than 1%)          Internal control cells present and stain as expected   HER2 (by  immunohistochemistry): NEGATIVE (Score 0)  TNM Descriptors: Not applicable  pT1c  Regional Lymph Nodes Modifier: Not applicable  pN0  pM - Not applicable  #  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   #Right breast cancer status postlumpectomy-Oncotype low risk; no chemotherapy.  # NOV 14th, 2022-start anastrozole for 5 years.   Carcinoma of upper-inner quadrant of right breast in female, estrogen receptor positive (HCC)  04/18/2021 Initial Diagnosis   Carcinoma of upper-inner quadrant of right breast in female, estrogen receptor positive (HCC)    Genetic Testing   Negative genetic testing. No pathogenic variants identified on the [employer] CancerNext-Expanded+RNA Panel. The report date is 04/30/2021.   The CancerNext-Expanded + RNAinsight gene panel offered by [employer] and includes sequencing and rearrangement analysis for the following 77 genes: IP, ALK, APC*, ATM*, AXIN2, BAP1, BARD1, BLM, BMPR1A, BRCA1*, BRCA2*, BRIP1*, CDC73, CDH1*,CDK4, CDKN1B, CDKN2A, CHEK2*, CTNNA1, DICER1, FANCC, FH, FLCN, GALNT12, KIF1B, LZTR1, MAX, MEN1, MET, MLH1*, MSH2*, MSH3, MSH6*, MUTYH*, NBN, NF1*, NF2, NTHL1, PALB2*, PHOX2B, PMS2*, POT1, PRKAR1A, PTCH1, PTEN*, RAD51C*, RAD51D*,RB1, RECQL, RET, SDHA, SDHAF2, SDHB, SDHC, SDHD, SMAD4, SMARCA4, SMARCB1, SMARCE1, STK11, SUFU, TMEM127, TP53*,TSC1, TSC2, VHL and XRCC2 (sequencing and deletion/duplication); EGFR, EGLN1,  HOXB13, KIT, MITF, PDGFRA, POLD1 and POLE (sequencing only); EPCAM and GREM1 (deletion/duplication only).   05/25/2021 Cancer Staging   Staging form: Breast, AJCC 8th Edition - Pathologic: Stage IA (pT1c, pN0, cM0, G2, ER+, PR-, HER2-) - Signed by Earna Coder, MD on 05/25/2021 Histologic grading system: 3 grade system     HISTORY OF PRESENTING ILLNESS: Ambulating independently.  Alone.  Meghan Harper 67 y.o.  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  Patient  complains of ongoing fatigue.  Shortness of breath on exertion.  Patient noted to have diarrhea-awaiting to see GI next month.  Denies any blood in stools or black-colored stool.  Also of note patient was treated for her bronchitis as per PCP about 2 weeks ago.  Complains of mild epigastric discomfort.  She is taking Advil to 4-5 in the last 1 week.  Patient denies any worsening hot flashes or joint pains.  Denies any nausea vomiting.   Review of Systems  Constitutional:  Positive for malaise/fatigue and weight loss. Negative for chills, diaphoresis and fever.  HENT:  Negative for nosebleeds and sore throat.   Eyes:  Negative for double vision.  Respiratory:  Positive for cough and shortness of breath. Negative for hemoptysis, sputum production and wheezing.   Cardiovascular:  Negative for chest pain, palpitations, orthopnea and leg swelling.  Gastrointestinal:  Positive for abdominal pain and nausea. Negative for blood in stool, constipation, diarrhea, heartburn, melena and vomiting.  Genitourinary:  Negative for dysuria, frequency and urgency.  Musculoskeletal:  Positive for joint pain. Negative for back pain.  Skin: Negative.  Negative for itching and rash.  Neurological:  Negative for dizziness, tingling, focal weakness, weakness and headaches.  Endo/Heme/Allergies:  Does not bruise/bleed easily.  Psychiatric/Behavioral:  Negative for depression. The patient is not nervous/anxious and does not have insomnia.      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 Excised: 04/21/2010   Personal history of radiation therapy    Pre-diabetes    Wears contact lenses      SURGICAL HISTORY: Past Surgical History:  Procedure Laterality Date   BREAST BIOPSY Right 1987, 2013   fibroadenoma x 2   BREAST BIOPSY Right 04/06/2021   u/s bx-"venus" clip-positive   BREAST EXCISIONAL BIOPSY Left 1978   BREAST FIBROADENOMA SURGERY     BREAST LUMPECTOMY WITH SENTINEL LYMPH NODE BIOPSY Right 05/08/2021   Procedure: BREAST LUMPECTOMY WITH SENTINEL LYMPH NODE BX;  Surgeon: Earline Mayotte, MD;  Location: [facility] ORS;  Service: General;  Laterality: Right;   CARPAL TUNNEL RELEASE Right 08/28/2017   Procedure: CARPAL TUNNEL RELEASE ENDOSCOPIC;  Surgeon: Christena Flake, MD;  Location: [facility];  Service: Orthopedics;  Laterality: Right;   CARPAL TUNNEL RELEASE Left 11/06/2017   Procedure: CARPAL TUNNEL RELEASE ENDOSCOPIC;  Surgeon: Christena Flake, MD;  Location: [facility];  Service: Orthopedics;  Laterality: Left;   COLONOSCOPY N/A 09/26/2015   Procedure: COLONOSCOPY;  Surgeon: Scot Jun, MD;  Location: [facility] ENDOSCOPY;  Service: Endoscopy;  Laterality: N/A;   COLONOSCOPY WITH PROPOFOL N/A 04/16/2022   Procedure: COLONOSCOPY WITH PROPOFOL;  Surgeon: Regis Bill, MD;  Location: [facility] ENDOSCOPY;  Service: Endoscopy;  Laterality: N/A;   TOOTH EXTRACTION      SOCIAL HISTORY: Social History   Socioeconomic History   Marital status: Single    Spouse name: Not on file   Number of children: Not on file   Years of education: Not on file   Highest education level: Not on file  Occupational History   Not on file  Tobacco Use   Smoking status: Never   Smokeless tobacco: Never  Vaping Use   Vaping status: Never Used  Substance and Sexual Activity   Alcohol use: Yes    Alcohol/week: 0.0 standard drinks of alcohol    Comment: may have a drink 3-4x/yr   Drug use: No   Sexual activity: Not on file  Other Topics Concern   Not on file  Social History Narrative   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 No smoking; lives in [place];  works in [place].    Social Determinants of Health   Financial Resource Strain: Low Risk  (08/06/2023)   Received from [facility]   Overall Financial Resource Strain (CARDIA)    Difficulty of Paying Living Expenses: Not hard at all  Food Insecurity: No Food Insecurity (08/06/2023)   Received from [facility]   Hunger Vital Sign    Worried About Running Out of Food in the Last Year: Never true    Ran Out of Food in the Last Year: Never true  Transportation Needs: No Transportation Needs (08/06/2023)   Received from [facility] - Transportation    In the past 12 months, has lack of transportation kept you from medical appointments or from getting medications?: No    Lack of Transportation (Non-Medical): No  Physical Activity: Not on file  Stress: Not on file  Social Connections: Not on file  Intimate Partner Violence: Not on file    FAMILY HISTORY: Family History  Problem Relation Age of Onset   Colon cancer Mother        dx 50s   Other Brother        CLL and skin cancers   Breast cancer Maternal Aunt 35   Lung cancer Maternal Aunt    Breast cancer Paternal Aunt 92   Pancreatic cancer Paternal Aunt    Melanoma Maternal Uncle    Breast cancer Niece        dx 30s, "BRCA pos"    ALLERGIES:  is allergic to wound dressing adhesive and meloxicam.  MEDICATIONS:  Current Outpatient Medications  Medication Sig Dispense Refill   anastrozole (ARIMIDEX) 1 MG tablet TAKE 1 TABLET BY MOUTH DAILY 90 tablet 1   Calcium Carb-Cholecalciferol (CALCIUM 600 + D PO) Take 1 tablet by mouth 2 (two) times daily.     cetirizine (ZYRTEC) 10 MG tablet Take 10 mg by mouth daily.     CO-ENZYME Q-10 PO Take 100 mg by mouth daily.     Glucosamine-Chondroitin (MOVE FREE PO) Take 1 tablet by mouth daily.     ibuprofen (ADVIL) 200 MG tablet Take 400 mg by mouth every 6 (six) hours as needed for mild pain.     Krill Oil 350 MG CAPS Take 350 mg  by mouth daily.     levocetirizine (XYZAL) 2.5 MG/5ML solution Take 2.5 mg by mouth every evening.  magnesium chloride (SLOW-MAG) 64 MG TBEC SR tablet Take 1 tablet by mouth 2 (two) times daily.     Misc Natural Products (AIRBORNE ELDERBERRY PO) Take by mouth.     Multiple Vitamin (MULTIVITAMIN) capsule Take 1 capsule by mouth daily. Centrum Silver     Pamabrom 50 MG CAPS Take 50 mg by mouth daily as needed (water gain).     Phenylephrine-Acetaminophen (TYLENOL SINUS+HEADACHE) 5-325 MG TABS Take 1 tablet by mouth daily as needed (Headache).     Polyvinyl Alcohol-Povidone (CLEAR EYES ALL SEASONS) 5-6 MG/ML SOLN Place 1 drop into both eyes daily as needed (Dry eyes).     Probiotic Product (PROBIOTIC-10 PO) Take 1 tablet by mouth daily.     rosuvastatin (CRESTOR) 5 MG tablet Take 5 mg by mouth daily.     Turmeric (QC TUMERIC COMPLEX PO) Take by mouth.     COLLAGEN PO Take by mouth. (Patient not taking: Reported on 08/10/2022)     No current facility-administered medications for this visit.      Marland Kitchen  PHYSICAL EXAMINATION: ECOG PERFORMANCE STATUS: 0 - Asymptomatic  Vitals:   08/12/23 1335  BP: (!) 140/67  Pulse: 95  Temp: 99.4 F (37.4 C)  SpO2: 100%   Filed Weights   08/12/23 1335  Weight: 187 lb 3.2 oz (84.9 kg)    Physical Exam Vitals and nursing note reviewed.  HENT:     Head: Normocephalic and atraumatic.     Mouth/Throat:     Pharynx: Oropharynx is clear.  Eyes:     Extraocular Movements: Extraocular movements intact.     Pupils: Pupils are equal, round, and reactive to light.  Cardiovascular:     Rate and Rhythm: Normal rate and regular rhythm.  Pulmonary:     Comments: Decreased breath sounds bilaterally.  Abdominal:     Palpations: Abdomen is soft.  Musculoskeletal:        General: Normal range of motion.     Cervical back: Normal range of motion.  Skin:    General: Skin is warm.  Neurological:     General: No focal deficit present.     Mental Status: She  is alert and oriented to person, place, and time.  Psychiatric:        Behavior: Behavior normal.        Judgment: Judgment normal.      LABORATORY DATA:  I have reviewed the data as listed Lab Results  Component Value Date   WBC 8.0 08/12/2023   HGB 6.0 (LL) 08/12/2023   HCT 21.8 (L) 08/12/2023   MCV 67.3 (L) 08/12/2023   PLT 355 08/12/2023   Recent Labs    02/08/23 1403 08/12/23 1321  NA 139 135  K 4.5 4.0  CL 106 104  CO2 26 24  GLUCOSE 91 106*  BUN 18 23  CREATININE 0.67 0.60  CALCIUM 9.1 8.7*  GFRNONAA >60 >60  PROT 7.1 6.8  ALBUMIN 4.1 3.9  AST 24 19  ALT 23 18  ALKPHOS 54 50  BILITOT 0.5 0.3     RADIOGRAPHIC STUDIES: I have personally reviewed the radiological images as listed and agreed with the findings in the report. DG Bone Density  Result Date: 07/30/2023 EXAM: DUAL X-RAY ABSORPTIOMETRY (DXA) FOR BONE MINERAL DENSITY IMPRESSION: Your patient Meghan Harper completed a BMD test on 07/30/2023 using the [employer] iDXA DXA System (software version: 14.10) manufactured by [employer]. The following summarizes the results of our evaluation. Technologist:VLM PATIENT BIOGRAPHICAL: Name: Spira,  Darika Stubenrauch Patient ID: [id number] Birth Date: 04-Nov-1955 Height: 62.0 in. Gender: Female Exam Date: 07/30/2023 Weight: 186.0 lbs. Indications: Caucasian, History of Breast Cancer, History of Radiation, Postmenopausal Fractures: Treatments: Anastrozole, calcium w/ vit D DENSITOMETRY RESULTS: Site          Region     Measured Date Measured Age WHO Classification Young Adult T-score BMD         %Change vs. Previous Significant Change (*) Right Forearm Radius 33% 07/30/2023 65.8 Normal -0.1 0.871 g/cm2 - - DualFemur Neck Right 07/30/2023 65.8 Normal -0.7 0.940 g/cm2 - - ASSESSMENT: The BMD measured at Femur Neck Right is 0.940 g/cm2 with a T-score of -0.7. This patient is considered normal according to World Health Organization [facility]) criteria. Lumbar spine was not utilized due to  advanced degenerative changes. The scan quality is good. World Science writer [facility]) criteria for post-menopausal, Caucasian Women: Normal:                   T-score at or above -1 SD Osteopenia/low bone mass: T-score between -1 and -2.5 SD Osteoporosis:             T-score at or below -2.5 SD RECOMMENDATIONS: 1. All patients should optimize calcium and vitamin D intake. 2. Consider FDA-approved medical therapies in postmenopausal women and men aged 30 years and older, based on the following: a. A hip or vertebral(clinical or morphometric) fracture b. T-score < -2.5 at the femoral neck or spine after appropriate evaluation to exclude secondary causes c. Low bone mass (T-score between -1.0 and -2.5 at the femoral neck or spine) and a 10-year probability of a hip fracture > 3% or a 10-year probability of a major osteoporosis-related fracture > 20% based on the [place]-adapted WHO algorithm 3. Clinician judgment and/or patient preferences may indicate treatment for people with 10-year fracture probabilities above or below these levels FOLLOW-UP: People with diagnosed cases of osteoporosis or at high risk for fracture should have regular bone mineral density tests. For patients eligible for Medicare, routine testing is allowed once every 2 years. The testing frequency can be increased to one year for patients who have rapidly progressing disease, those who are receiving or discontinuing medical therapy to restore bone mass, or have additional risk factors. I have reviewed this report, and agree with the above findings. [facility] Electronically Signed   By: Romona Curls M.D.   On: 07/30/2023 10:17    ASSESSMENT & PLAN:   Carcinoma of upper-inner quadrant of right breast in female, estrogen receptor positive (HCC) #Stage I T1c- ER positive; PR negative; her 2 negative; s/p lumpectomy clear margins [Dr,Byrnett/ Dr.Sam ]. ONCOTYPE-LOW RISK- s/p RT. June 2022. MAy 2023- Bil Mamm-WNL; stable.   # ON  anastrazole- tolerating well except for hot flashes- see below.  # Hot flashes grade 1-recommend monitor for now; stable.   # severe anemia- hb- 6.0- check iron studies;ferritin; LDH; retic count [June 2023- colo, Dr.Locklear; no EGD]- awaiting evaluation on Nov 18th. On Advils - 5-6 in last 1 week. ? Etiology-   # BMD- NOV 2022- [Dr.Moriyati; [place] [facility]]-The BMD measured at Femur Neck Right is 0.940 g/cm2 with a T-score of -0.7.  Continue Calcium plus vitamin D; weightbearing exercises.   stable  # DISPOSITION: # #needs to go to ER for blood today  # Order UA re: anemia # add to today labs- check iron studies;ferritin; LDH; retic count # in 1 weeks- APP; labs- cbc;hold tube; possible D-2 PRBC transfusion #  and 2 more weekly IV venofer # Follow up in 4 weeks- MD; cbc/cmp; possible venofer-- -Dr.B   All questions were answered. The patient/family knows to call the clinic with any problems, questions or concerns.    Earna Coder, MD 08/12/2023 2:49 PM

## 2023-08-12 NOTE — ED Notes (Signed)
Blood unit released, awaiting unit from blood bank

## 2023-08-12 NOTE — ED Provider Triage Note (Signed)
Emergency Medicine Provider Triage Evaluation Note  Srinika Drewniak, a 67 y.o. female with a history of breast cancer was evaluated in triage.  Pt complains of several weeks of generalized weakness. She presents to the ED for the [facility] for 6 month recheck. She was found to be anemic with a Hgb of 6. She denies fevers, bleeding, headache, or dark stools.   Review of Systems  Positive: weakness Negative: Syncope, GI bleed  Physical Exam  BP (!) 143/68 (BP Location: Left Arm)   Pulse 89   Temp 99 F (37.2 C) (Oral)   Resp 18   Ht 5\' 2"  (1.575 m)   Wt 83 kg   SpO2 98%   BMI 33.47 kg/m  Gen:   Awake, no distress  NAD Resp:  Normal effort CTA MSK:   Moves extremities without difficulty  Other:    Medical Decision Making  Medically screening exam initiated at 3:27 PM.  Appropriate orders placed.  Ward [name] was informed that the remainder of the evaluation will be completed by another provider, this initial triage assessment does not replace that evaluation, and the importance of remaining in the ED until their evaluation is complete.  Breast cancer patient to the ED from the [facility] for low H/H on routine blood draw.    Lissa Hoard, PA-C 08/12/23 1530

## 2023-08-12 NOTE — ED Notes (Signed)
Provider made aware blood transfusion complete

## 2023-08-12 NOTE — ED Notes (Signed)
Assumed care of pt at this time. Pt is AAXO4, on CCM, VS stable and WNL. Call light within reach, side rails up x2.

## 2023-08-12 NOTE — ED Notes (Signed)
Pt ambulated to restroom and back to bed safely.

## 2023-08-12 NOTE — Assessment & Plan Note (Addendum)
#  Stage I T1c- ER positive; PR negative; her 2 negative; s/p lumpectomy clear margins [Dr,Byrnett/ Dr.Sam ]. ONCOTYPE-LOW RISK- s/p RT. June 2022. MAy 2023- Bil Mamm-WNL; stable.   # ON anastrazole- tolerating well except for hot flashes- see below.  # Hot flashes grade 1-recommend monitor for now; stable.   # severe anemia- hb- 6.0- check iron studies;ferritin; LDH; retic count [June 2023- colo, Dr.Locklear; no EGD]- awaiting evaluation on Nov 18th. On Advils - 5-6 in last 1 week. ? Etiology-   # BMD- NOV 2022- [Dr.Moriyati;  Med]-The BMD measured at Femur Neck Right is 0.940 g/cm2 with a T-score of -0.7.  Continue Calcium plus vitamin D; weightbearing exercises.   stable  # DISPOSITION: # #needs to go to ER for blood today  # Order UA re: anemia # add to today labs- check iron studies;ferritin; LDH; retic count # in 1 weeks- APP; labs- cbc;hold tube; possible D-2 PRBC transfusion # and 2 more weekly IV venofer # Follow up in 4 weeks- MD; cbc/cmp; possible venofer-- -Dr.B

## 2023-08-12 NOTE — ED Triage Notes (Signed)
PT had a 6 month check up with Provider and had blood work drawn. Pt was called today and told to come to ER due to hgb of 6. Pt states she has been tired recently and felt like she was getting more SOB with any exertion.

## 2023-08-12 NOTE — ED Notes (Signed)
Received verbal and written consent for blood transfusion from the patient.

## 2023-08-12 NOTE — ED Provider Notes (Signed)
[facility] Provider Note    Event Date/Time   First MD Initiated Contact with Patient 08/12/23 1553     (approximate)   History   Chief Complaint: Weakness   HPI  Meghan Harper is a 67 y.o. female with a history of breast cancer who is brought to the ED due to fatigue over the past few months.  Gets tired and winded with exertion more easily.  Saw hematology who found that her hemoglobin level was 6 and sent her to the ED for further management.  Outside records reviewed noting that hematology intended to give a red blood cell transfusion, but they did not have any infusion resources available over the next 3 days, so sent her to the ED for a transfusion.  They have already arranged for serial iron infusions outpatient.  They performed iron panel which confirms iron depletion.  Patient denies palpitations dizziness syncope chest pain.     Physical Exam   Triage Vital Signs: ED Triage Vitals  Encounter Vitals Group     BP 08/12/23 1524 (!) 143/68     Systolic BP Percentile --      Diastolic BP Percentile --      Pulse Rate 08/12/23 1524 89     Resp 08/12/23 1524 18     Temp 08/12/23 1524 99 F (37.2 C)     Temp Source 08/12/23 1524 Oral     SpO2 08/12/23 1524 98 %     Weight 08/12/23 1517 183 lb (83 kg)     Height 08/12/23 1517 5\' 2"  (1.575 m)     Head Circumference --      Peak Flow --      Pain Score 08/12/23 1517 0     Pain Loc --      Pain Education --      Exclude from Growth Chart --     Most recent vital signs: Vitals:   08/12/23 1524 08/12/23 1549  BP: (!) 143/68 (!) 149/81  Pulse: 89 94  Resp: 18 18  Temp: 99 F (37.2 C)   SpO2: 98% 100%    General: Awake, no distress.  CV:  Good peripheral perfusion.  Regular rate and rhythm Resp:  Normal effort.  Clear to auscultation bilaterally Abd:  No distention.  Soft nontender.  Rectal exam reveals thin brown stool, Hemoccult negative Other:  Moist oral mucosa   ED Results /  Procedures / Treatments   Labs (all labs ordered are listed, but only abnormal results are displayed) Labs Reviewed  COMPREHENSIVE METABOLIC PANEL - Abnormal; Notable for the following components:      Result Value   Sodium 134 (*)    Glucose, Bld 108 (*)    All other components within normal limits  CBC  CBC WITH DIFFERENTIAL/PLATELET  TYPE AND SCREEN     EKG    RADIOLOGY    PROCEDURES:  .Critical Care  Performed by: Sharman Cheek, MD Authorized by: Sharman Cheek, MD   Critical care provider statement:    Critical care time (minutes):  35   Critical care time was exclusive of:  Separately billable procedures and treating other patients   Critical care was necessary to treat or prevent imminent or life-threatening deterioration of the following conditions:  Circulatory failure   Critical care was time spent personally by me on the following activities:  Development of treatment plan with patient or surrogate, discussions with consultants, evaluation of patient's response to treatment, examination of patient,  obtaining history from patient or surrogate, ordering and performing treatments and interventions, ordering and review of laboratory studies, ordering and review of radiographic studies, pulse oximetry, re-evaluation of patient's condition and review of old charts    MEDICATIONS ORDERED IN ED: Medications - No data to display   IMPRESSION / MDM / ASSESSMENT AND PLAN / ED COURSE  I reviewed the triage vital signs and the nursing notes.   Patient's presentation is most consistent with acute presentation with potential threat to life or bodily function.   Clinical Course as of 08/13/23 0010  Mon Aug 12, 2023  1625 Sent to ED for RBC transfusion due to sx anemia with hb 6, iron panel demonstrating iron depletion, likely due to inadequate iron intake from retrictive diet for IBS. Has f/u with heme for continued iron infusion next week. Has GI appt already. No  e/o GI bleed or end organ damage today. Will tranfuse, give dose of IV iron, plan to discharge if she remains stable. [PS]    Clinical Course User Index [PS] Sharman Cheek, MD    ----------------------------------------- 12:12 AM on 08/13/2023 ----------------------------------------- Red cell transfusion completed, iron infusion completed.  Patient remains asymptomatic in the ED, no signs or symptoms of adverse reaction.  Vitals remain normal.  Stable for discharge.   FINAL CLINICAL IMPRESSION(S) / ED DIAGNOSES   Final diagnoses:  Iron deficiency anemia, unspecified iron deficiency anemia type     Rx / DC Orders   ED Discharge Orders     None        Note:  This document was prepared using Dragon voice recognition software and may include unintentional dictation errors.   Sharman Cheek, MD 08/13/23 [phone]

## 2023-08-12 NOTE — ED Notes (Signed)
Provided pt with discharge instructions and education. All of pt questions answered. Pt in possession of all belongings. Pt AAOX4 and stable at time of discharge.Pt ambulated w/ steady gait towards ED exit. Pt accompanied by family member.

## 2023-08-12 NOTE — Progress Notes (Signed)
Having some food intolerances, seeing GI next month.  Treated for bronchitis, sinus 07/29/23, since has been SOB, cough, increase heart rate.  Appetite 50% normal, fair life 1/day.

## 2023-08-13 LAB — TYPE AND SCREEN
ABO/RH(D): A NEG
Antibody Screen: NEGATIVE
Unit division: 0

## 2023-08-13 LAB — BPAM RBC
Blood Product Expiration Date: 202411182359
ISSUE DATE / TIME: 202410212022
Unit Type and Rh: 600

## 2023-08-21 ENCOUNTER — Other Ambulatory Visit: Payer: Self-pay | Admitting: Nurse Practitioner

## 2023-08-21 ENCOUNTER — Inpatient Hospital Stay: Payer: Medicare Other

## 2023-08-21 VITALS — BP 116/63 | HR 64 | Temp 97.7°F | Resp 18

## 2023-08-21 DIAGNOSIS — C50211 Malignant neoplasm of upper-inner quadrant of right female breast: Secondary | ICD-10-CM | POA: Diagnosis not present

## 2023-08-21 DIAGNOSIS — D649 Anemia, unspecified: Secondary | ICD-10-CM

## 2023-08-21 DIAGNOSIS — Z170 Estrogen receptor positive status [ER+]: Secondary | ICD-10-CM

## 2023-08-21 MED ORDER — IRON SUCROSE 20 MG/ML IV SOLN
200.0000 mg | Freq: Once | INTRAVENOUS | Status: AC
  Administered 2023-08-21: 200 mg via INTRAVENOUS
  Filled 2023-08-21: qty 10

## 2023-08-21 NOTE — Progress Notes (Signed)
Venofer orders entered on behalf of Dr. Donneta Romberg. Patient received IV iron in ER. I have follow up scheduled with patient to evaluate new diagnosis of IDA and continue venofer.

## 2023-08-22 ENCOUNTER — Inpatient Hospital Stay (HOSPITAL_BASED_OUTPATIENT_CLINIC_OR_DEPARTMENT_OTHER): Payer: Medicare Other | Admitting: Nurse Practitioner

## 2023-08-22 ENCOUNTER — Inpatient Hospital Stay: Payer: Medicare Other

## 2023-08-22 ENCOUNTER — Encounter: Payer: Self-pay | Admitting: Nurse Practitioner

## 2023-08-22 ENCOUNTER — Other Ambulatory Visit: Payer: Self-pay | Admitting: *Deleted

## 2023-08-22 VITALS — BP 121/66 | HR 79 | Temp 98.6°F | Resp 20 | Wt 188.7 lb

## 2023-08-22 DIAGNOSIS — D509 Iron deficiency anemia, unspecified: Secondary | ICD-10-CM

## 2023-08-22 DIAGNOSIS — Z170 Estrogen receptor positive status [ER+]: Secondary | ICD-10-CM

## 2023-08-22 DIAGNOSIS — D649 Anemia, unspecified: Secondary | ICD-10-CM

## 2023-08-22 DIAGNOSIS — C50211 Malignant neoplasm of upper-inner quadrant of right female breast: Secondary | ICD-10-CM | POA: Diagnosis not present

## 2023-08-22 LAB — URINALYSIS, COMPLETE (UACMP) WITH MICROSCOPIC
Bacteria, UA: NONE SEEN
Bilirubin Urine: NEGATIVE
Glucose, UA: NEGATIVE mg/dL
Hgb urine dipstick: NEGATIVE
Ketones, ur: NEGATIVE mg/dL
Leukocytes,Ua: NEGATIVE
Nitrite: NEGATIVE
Protein, ur: NEGATIVE mg/dL
Specific Gravity, Urine: 1.014 (ref 1.005–1.030)
Squamous Epithelial / HPF: 0 /[HPF] (ref 0–5)
pH: 5 (ref 5.0–8.0)

## 2023-08-22 LAB — CBC WITH DIFFERENTIAL (CANCER CENTER ONLY)
Abs Immature Granulocytes: 0.01 10*3/uL (ref 0.00–0.07)
Basophils Absolute: 0.1 10*3/uL (ref 0.0–0.1)
Basophils Relative: 1 %
Eosinophils Absolute: 0.2 10*3/uL (ref 0.0–0.5)
Eosinophils Relative: 4 %
HCT: 26.2 % — ABNORMAL LOW (ref 36.0–46.0)
Hemoglobin: 7.3 g/dL — ABNORMAL LOW (ref 12.0–15.0)
Immature Granulocytes: 0 %
Lymphocytes Relative: 21 %
Lymphs Abs: 0.8 10*3/uL (ref 0.7–4.0)
MCH: 21.2 pg — ABNORMAL LOW (ref 26.0–34.0)
MCHC: 27.9 g/dL — ABNORMAL LOW (ref 30.0–36.0)
MCV: 75.9 fL — ABNORMAL LOW (ref 80.0–100.0)
Monocytes Absolute: 0.4 10*3/uL (ref 0.1–1.0)
Monocytes Relative: 10 %
Neutro Abs: 2.4 10*3/uL (ref 1.7–7.7)
Neutrophils Relative %: 64 %
Platelet Count: 247 10*3/uL (ref 150–400)
RBC: 3.45 MIL/uL — ABNORMAL LOW (ref 3.87–5.11)
RDW: 26.2 % — ABNORMAL HIGH (ref 11.5–15.5)
WBC Count: 3.8 10*3/uL — ABNORMAL LOW (ref 4.0–10.5)
nRBC: 0 % (ref 0.0–0.2)

## 2023-08-22 LAB — PREPARE RBC (CROSSMATCH)

## 2023-08-23 ENCOUNTER — Inpatient Hospital Stay: Payer: Medicare Other | Attending: Nurse Practitioner

## 2023-08-23 DIAGNOSIS — C50211 Malignant neoplasm of upper-inner quadrant of right female breast: Secondary | ICD-10-CM | POA: Diagnosis present

## 2023-08-23 DIAGNOSIS — Z170 Estrogen receptor positive status [ER+]: Secondary | ICD-10-CM | POA: Insufficient documentation

## 2023-08-23 DIAGNOSIS — D509 Iron deficiency anemia, unspecified: Secondary | ICD-10-CM | POA: Insufficient documentation

## 2023-08-23 DIAGNOSIS — D649 Anemia, unspecified: Secondary | ICD-10-CM

## 2023-08-23 DIAGNOSIS — K589 Irritable bowel syndrome without diarrhea: Secondary | ICD-10-CM | POA: Insufficient documentation

## 2023-08-23 DIAGNOSIS — Z79811 Long term (current) use of aromatase inhibitors: Secondary | ICD-10-CM | POA: Diagnosis not present

## 2023-08-23 DIAGNOSIS — R5383 Other fatigue: Secondary | ICD-10-CM | POA: Diagnosis not present

## 2023-08-23 DIAGNOSIS — N951 Menopausal and female climacteric states: Secondary | ICD-10-CM | POA: Diagnosis not present

## 2023-08-23 DIAGNOSIS — Z79899 Other long term (current) drug therapy: Secondary | ICD-10-CM | POA: Insufficient documentation

## 2023-08-23 DIAGNOSIS — Z86006 Personal history of melanoma in-situ: Secondary | ICD-10-CM | POA: Diagnosis not present

## 2023-08-23 MED ORDER — ACETAMINOPHEN 325 MG PO TABS
650.0000 mg | ORAL_TABLET | Freq: Once | ORAL | Status: DC
Start: 2023-08-23 — End: 2023-08-23

## 2023-08-23 MED ORDER — DIPHENHYDRAMINE HCL 25 MG PO CAPS: 25.0000 mg | ORAL_CAPSULE | Freq: Once | ORAL | Status: DC

## 2023-08-23 MED ORDER — SODIUM CHLORIDE 0.9% IV SOLUTION
250.0000 mL | INTRAVENOUS | Status: DC
Start: 2023-08-23 — End: 2023-08-23
  Administered 2023-08-23: 250 mL via INTRAVENOUS
  Filled 2023-08-23: qty 250

## 2023-08-24 LAB — TYPE AND SCREEN
ABO/RH(D): A NEG
Antibody Screen: NEGATIVE
Unit division: 0

## 2023-08-24 LAB — BPAM RBC
Blood Product Expiration Date: 202411042359
ISSUE DATE / TIME: 202411010829
Unit Type and Rh: 600

## 2023-08-26 ENCOUNTER — Inpatient Hospital Stay: Payer: Medicare Other

## 2023-08-26 VITALS — BP 142/67 | HR 67 | Temp 98.4°F | Resp 16

## 2023-08-26 DIAGNOSIS — C50211 Malignant neoplasm of upper-inner quadrant of right female breast: Secondary | ICD-10-CM | POA: Diagnosis not present

## 2023-08-26 DIAGNOSIS — Z170 Estrogen receptor positive status [ER+]: Secondary | ICD-10-CM

## 2023-08-26 MED ORDER — IRON SUCROSE 20 MG/ML IV SOLN
200.0000 mg | Freq: Once | INTRAVENOUS | Status: AC
  Administered 2023-08-26: 200 mg via INTRAVENOUS
  Filled 2023-08-26: qty 10

## 2023-08-27 ENCOUNTER — Encounter: Payer: Self-pay | Admitting: Internal Medicine

## 2023-08-28 ENCOUNTER — Inpatient Hospital Stay: Payer: Medicare Other

## 2023-08-28 VITALS — BP 129/68 | HR 68 | Temp 96.0°F | Resp 16

## 2023-08-28 DIAGNOSIS — C50211 Malignant neoplasm of upper-inner quadrant of right female breast: Secondary | ICD-10-CM | POA: Diagnosis not present

## 2023-08-28 DIAGNOSIS — D649 Anemia, unspecified: Secondary | ICD-10-CM

## 2023-08-28 LAB — CBC WITH DIFFERENTIAL (CANCER CENTER ONLY)
Abs Immature Granulocytes: 0.01 10*3/uL (ref 0.00–0.07)
Basophils Absolute: 0.1 10*3/uL (ref 0.0–0.1)
Basophils Relative: 1 %
Eosinophils Absolute: 0.1 10*3/uL (ref 0.0–0.5)
Eosinophils Relative: 2 %
HCT: 34 % — ABNORMAL LOW (ref 36.0–46.0)
Hemoglobin: 9.9 g/dL — ABNORMAL LOW (ref 12.0–15.0)
Immature Granulocytes: 0 %
Lymphocytes Relative: 18 %
Lymphs Abs: 1 10*3/uL (ref 0.7–4.0)
MCH: 22.9 pg — ABNORMAL LOW (ref 26.0–34.0)
MCHC: 29.1 g/dL — ABNORMAL LOW (ref 30.0–36.0)
MCV: 78.5 fL — ABNORMAL LOW (ref 80.0–100.0)
Monocytes Absolute: 0.5 10*3/uL (ref 0.1–1.0)
Monocytes Relative: 10 %
Neutro Abs: 3.6 10*3/uL (ref 1.7–7.7)
Neutrophils Relative %: 69 %
Platelet Count: 344 10*3/uL (ref 150–400)
RBC: 4.33 MIL/uL (ref 3.87–5.11)
RDW: 26.9 % — ABNORMAL HIGH (ref 11.5–15.5)
WBC Count: 5.2 10*3/uL (ref 4.0–10.5)
nRBC: 0 % (ref 0.0–0.2)

## 2023-08-28 LAB — SAMPLE TO BLOOD BANK

## 2023-08-28 MED ORDER — IRON SUCROSE 20 MG/ML IV SOLN
200.0000 mg | Freq: Once | INTRAVENOUS | Status: AC
Start: 2023-08-28 — End: 2023-08-28
  Administered 2023-08-28: 200 mg via INTRAVENOUS

## 2023-08-28 MED ORDER — SODIUM CHLORIDE 0.9% FLUSH
10.0000 mL | INTRAVENOUS | Status: DC | PRN
  Administered 2023-08-28: 10 mL via INTRAVENOUS
  Filled 2023-08-28: qty 10

## 2023-08-28 NOTE — Progress Notes (Signed)
Patient declined to wait the 30 minutes for post iron infusion observation today. Tolerated infusion well. VSS. 

## 2023-08-30 ENCOUNTER — Inpatient Hospital Stay: Payer: Medicare Other

## 2023-08-30 VITALS — BP 130/64 | HR 84 | Temp 98.0°F | Resp 18

## 2023-08-30 DIAGNOSIS — C50211 Malignant neoplasm of upper-inner quadrant of right female breast: Secondary | ICD-10-CM | POA: Diagnosis not present

## 2023-08-30 DIAGNOSIS — Z170 Estrogen receptor positive status [ER+]: Secondary | ICD-10-CM

## 2023-08-30 MED ORDER — IRON SUCROSE 20 MG/ML IV SOLN
200.0000 mg | Freq: Once | INTRAVENOUS | Status: AC
  Administered 2023-08-30: 200 mg via INTRAVENOUS

## 2023-08-30 NOTE — Patient Instructions (Signed)
Iron Sucrose Injection What is this medication? IRON SUCROSE (EYE ern SOO krose) treats low levels of iron (iron deficiency anemia) in people with kidney disease. Iron is a mineral that plays an important role in making red blood cells, which carry oxygen from your lungs to the rest of your body. This medicine may be used for other purposes; ask your health care provider or pharmacist if you have questions. COMMON BRAND NAME(S): Venofer What should I tell my care team before I take this medication? They need to know if you have any of these conditions: Anemia not caused by low iron levels Heart disease High levels of iron in the blood Kidney disease Liver disease An unusual or allergic reaction to iron, other medications, foods, dyes, or preservatives Pregnant or trying to get pregnant Breastfeeding How should I use this medication? This medication is for infusion into a vein. It is given in a hospital or clinic setting. Talk to your care team about the use of this medication in children. While this medication may be prescribed for children as young as 2 years for selected conditions, precautions do apply. Overdosage: If you think you have taken too much of this medicine contact a poison control center or emergency room at once. NOTE: This medicine is only for you. Do not share this medicine with others. What if I miss a dose? Keep appointments for follow-up doses. It is important not to miss your dose. Call your care team if you are unable to keep an appointment. What may interact with this medication? Do not take this medication with any of the following: Deferoxamine Dimercaprol Other iron products This medication may also interact with the following: Chloramphenicol Deferasirox This list may not describe all possible interactions. Give your health care provider a list of all the medicines, herbs, non-prescription drugs, or dietary supplements you use. Also tell them if you smoke,  drink alcohol, or use illegal drugs. Some items may interact with your medicine. What should I watch for while using this medication? Visit your care team regularly. Tell your care team if your symptoms do not start to get better or if they get worse. You may need blood work done while you are taking this medication. You may need to follow a special diet. Talk to your care team. Foods that contain iron include: whole grains/cereals, dried fruits, beans, or peas, leafy green vegetables, and organ meats (liver, kidney). What side effects may I notice from receiving this medication? Side effects that you should report to your care team as soon as possible: Allergic reactions--skin rash, itching, hives, swelling of the face, lips, tongue, or throat Low blood pressure--dizziness, feeling faint or lightheaded, blurry vision Shortness of breath Side effects that usually do not require medical attention (report to your care team if they continue or are bothersome): Flushing Headache Joint pain Muscle pain Nausea Pain, redness, or irritation at injection site This list may not describe all possible side effects. Call your doctor for medical advice about side effects. You may report side effects to FDA at 1-800-FDA-1088. Where should I keep my medication? This medication is given in a hospital or clinic. It will not be stored at home. NOTE: This sheet is a summary. It may not cover all possible information. If you have questions about this medicine, talk to your doctor, pharmacist, or health care provider.  2024 Elsevier/Gold Standard (2023-03-15 00:00:00)

## 2023-09-03 ENCOUNTER — Encounter: Payer: Self-pay | Admitting: Nurse Practitioner

## 2023-09-03 ENCOUNTER — Inpatient Hospital Stay (HOSPITAL_BASED_OUTPATIENT_CLINIC_OR_DEPARTMENT_OTHER): Payer: Medicare Other | Admitting: Nurse Practitioner

## 2023-09-03 ENCOUNTER — Inpatient Hospital Stay: Payer: Medicare Other

## 2023-09-03 VITALS — BP 133/69 | HR 71 | Temp 97.7°F | Wt 186.0 lb

## 2023-09-03 DIAGNOSIS — D509 Iron deficiency anemia, unspecified: Secondary | ICD-10-CM

## 2023-09-03 DIAGNOSIS — D649 Anemia, unspecified: Secondary | ICD-10-CM

## 2023-09-03 DIAGNOSIS — C50211 Malignant neoplasm of upper-inner quadrant of right female breast: Secondary | ICD-10-CM | POA: Diagnosis not present

## 2023-09-03 LAB — CBC WITH DIFFERENTIAL (CANCER CENTER ONLY)
Abs Immature Granulocytes: 0.01 10*3/uL (ref 0.00–0.07)
Basophils Absolute: 0.1 10*3/uL (ref 0.0–0.1)
Basophils Relative: 1 %
Eosinophils Absolute: 0.1 10*3/uL (ref 0.0–0.5)
Eosinophils Relative: 3 %
HCT: 36.5 % (ref 36.0–46.0)
Hemoglobin: 11.1 g/dL — ABNORMAL LOW (ref 12.0–15.0)
Immature Granulocytes: 0 %
Lymphocytes Relative: 23 %
Lymphs Abs: 1.1 10*3/uL (ref 0.7–4.0)
MCH: 24.3 pg — ABNORMAL LOW (ref 26.0–34.0)
MCHC: 30.4 g/dL (ref 30.0–36.0)
MCV: 80 fL (ref 80.0–100.0)
Monocytes Absolute: 0.5 10*3/uL (ref 0.1–1.0)
Monocytes Relative: 10 %
Neutro Abs: 3 10*3/uL (ref 1.7–7.7)
Neutrophils Relative %: 63 %
Platelet Count: 323 10*3/uL (ref 150–400)
RBC: 4.56 MIL/uL (ref 3.87–5.11)
Smear Review: NORMAL
WBC Count: 4.7 10*3/uL (ref 4.0–10.5)
nRBC: 0 % (ref 0.0–0.2)

## 2023-09-03 LAB — SAMPLE TO BLOOD BANK

## 2023-09-03 NOTE — Progress Notes (Signed)
Meghan Harper CONSULT NOTE  Patient Care Team: Patrice Paradise, MD as PCP - General (Physician Assistant) Scarlett Presto, RN (Inactive) as Oncology Nurse Navigator Carmina Miller, MD as Consulting Physician (Radiation Oncology) Earna Coder, MD as Consulting Physician (Internal Medicine) Earline Mayotte, MD as Consulting Physician (General Surgery)  CHIEF COMPLAINTS/PURPOSE OF CONSULTATION: Breast cancer & Anemia  Oncology History Overview Note  # June 2022- Targeted ultrasound was performed of the RIGHT upper inner breast. At 1:30 8 cm from the nipple, there is an irregular hypoechoic mass with indistinct margins. It measures approximately 10 x 5 x 6 mm. This corresponds to the site of screening mammographic concern.   Targeted ultrasound was performed of the RIGHT axilla. No suspicious axillary lymph nodes are seen.   IMPRESSION: 1. There is a 10 mm mass in the RIGHT upper inner breast which is concerning for malignancy. Recommend ultrasound-guided biopsy for definitive characterization.  DIAGNOSIS:  A. BREAST, RIGHT, 1:30 O'CLOCK 8 CM FROM NIPPLE; ULTRASOUND-GUIDED CORE  BIOPSY:  - INVASIVE MAMMARY CARCINOMA, NO SPECIAL TYPE.   Size of invasive carcinoma: 7 mm in this sample  Histologic grade of invasive carcinoma: Grade 2                       Glandular/tubular differentiation score: 3                       Nuclear pleomorphism score: 2                       Mitotic rate score: 1                       Total score: 6  Ductal carcinoma in situ: Present, intermediate grade  Lymphovascular invasion: Not identified   CASE SUMMARY: BREAST BIOMARKER TESTS  Estrogen Receptor (ER) Status: POSITIVE          Percentage of cells with nuclear positivity: Greater than 90%          Average intensity of staining: Strong   Progesterone Receptor (PgR) Status: NEGATIVE (less than 1%)          Internal control cells present and stain as expected   HER2  (by immunohistochemistry): NEGATIVE (Score 0)  TNM Descriptors: Not applicable  pT1c  Regional Lymph Nodes Modifier: Not applicable  pN0  pM - Not applicable  #  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   #Right breast cancer status postlumpectomy-Oncotype low risk; no chemotherapy.  # NOV 14th, 2022-start anastrozole for 5 years.   Carcinoma of upper-inner quadrant of right breast in female, estrogen receptor positive (HCC)  04/18/2021 Initial Diagnosis   Carcinoma of upper-inner quadrant of right breast in female, estrogen receptor positive (HCC)    Genetic Testing   Negative genetic testing. No pathogenic variants identified on the [employer] CancerNext-Expanded+RNA Panel. The report date is 04/30/2021.   The CancerNext-Expanded + RNAinsight gene panel offered by [employer] and includes sequencing and rearrangement analysis for the following 77 genes: IP, ALK, APC*, ATM*, AXIN2, BAP1, BARD1, BLM, BMPR1A, BRCA1*, BRCA2*, BRIP1*, CDC73, CDH1*,CDK4, CDKN1B, CDKN2A, CHEK2*, CTNNA1, DICER1, FANCC, FH, FLCN, GALNT12, KIF1B, LZTR1, MAX, MEN1, MET, MLH1*, MSH2*, MSH3, MSH6*, MUTYH*, NBN, NF1*, NF2, NTHL1, PALB2*, PHOX2B, PMS2*, POT1, PRKAR1A, PTCH1, PTEN*, RAD51C*, RAD51D*,RB1, RECQL, RET, SDHA, SDHAF2, SDHB, SDHC, SDHD, SMAD4, SMARCA4, SMARCB1, SMARCE1, STK11, SUFU, TMEM127, TP53*,TSC1, TSC2, VHL and XRCC2 (sequencing and deletion/duplication);  EGFR, EGLN1, HOXB13, KIT, MITF, PDGFRA, POLD1 and POLE (sequencing only); EPCAM and GREM1 (deletion/duplication only).   05/25/2021 Cancer Staging   Staging form: Breast, AJCC 8th Edition - Pathologic: Stage IA (pT1c, pN0, cM0, G2, ER+, PR-, HER2-) - Signed by Earna Coder, MD on 05/25/2021 Histologic grading system: 3 grade system     HISTORY OF PRESENTING ILLNESS: Ambulating independently. Alone.   Meghan Harper 67 y.o. 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 She remains fatigued but does feel somewhat better. Doesn't yet feel at baseline. She has appt with Gi next week.    Review of Systems  Constitutional:  Positive for malaise/fatigue. Negative for chills, diaphoresis, fever and weight loss.  HENT:  Negative for nosebleeds and sore throat.   Eyes:  Negative for double vision.  Respiratory:  Positive for shortness of breath. Negative for cough, hemoptysis, sputum production and wheezing.   Cardiovascular:  Negative for chest pain, palpitations, orthopnea and leg swelling.  Gastrointestinal:  Negative for abdominal pain, blood in stool, constipation, diarrhea, heartburn, melena, nausea and vomiting.  Genitourinary:  Negative for dysuria, frequency and urgency.  Musculoskeletal:  Negative for back pain, falls and joint pain.  Skin: Negative.  Negative for itching and rash.  Neurological:  Negative for dizziness, tingling, focal weakness, weakness and headaches.  Endo/Heme/Allergies:  Does not bruise/bleed easily.  Psychiatric/Behavioral:  Negative for depression. The patient is not nervous/anxious and does not have insomnia.      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 Excised: 04/21/2010   Personal history of radiation therapy    Pre-diabetes    Wears contact lenses     SURGICAL HISTORY: Past Surgical History:  Procedure Laterality Date   BREAST BIOPSY Right 1987, 2013   fibroadenoma x 2   BREAST BIOPSY Right 04/06/2021   u/s bx-"venus" clip-positive   BREAST EXCISIONAL BIOPSY Left 1978   BREAST FIBROADENOMA SURGERY     BREAST LUMPECTOMY  WITH SENTINEL LYMPH NODE BIOPSY Right 05/08/2021   Procedure: BREAST LUMPECTOMY WITH SENTINEL LYMPH NODE BX;  Surgeon: Earline Mayotte, MD;  Location: [facility] OR[facility];  Service: General;  Laterality: Right;   CARPAL TUNNEL RELEASE Right 08/28/2017   Procedure: CARPAL TUNNEL RELEASE ENDOSCOPIC;  Surgeon: Christena Flake, MD;  Location: [facility];  Service: Orthopedics;  Laterality: Right;   CARPAL TUNNEL RELEASE Left 11/06/2017   Procedure: CARPAL TUNNEL RELEASE ENDOSCOPIC;  Surgeon: Christena Flake, MD;  Location: [facility];  Service: Orthopedics;  Laterality: Left;   COLONOSCOPY N/A 09/26/2015   Procedure: COLONOSCOPY;  Surgeon: Scot Jun, MD;  Location: [facility];  Service: Endoscopy;  Laterality: N/A;   COLONOSCOPY WITH PROPOFOL N/A 04/16/2022   Procedure: COLONOSCOPY WITH PROPOFOL;  Surgeon: Regis Bill, MD;  Location: [facility] ENDOSCOPY;  Service: Endoscopy;  Laterality: N/A;   TOOTH EXTRACTION      SOCIAL HISTORY: Social History   Socioeconomic History   Marital  status: Single    Spouse name: Not on file   Number of children: Not on file   Years of education: Not on file   Highest education level: Not on file  Occupational History   Not on file  Tobacco Use   Smoking status: Never   Smokeless tobacco: Never  Vaping Use   Vaping status: Never Used  Substance and Sexual Activity   Alcohol use: Yes    Alcohol/week: 0.0 standard drinks of alcohol    Comment: may have a drink 3-4x/yr   Drug use: No   Sexual activity: Not on file  Other Topics Concern   Not on file  Social History Narrative   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 No smoking; lives in [place]; works in [employer].    Social Determinants of Health   Financial Resource Strain: Low Risk  (08/06/2023)   Received from [facility]   Overall Financial Resource Strain (CARDIA)    Difficulty of Paying Living Expenses: Not hard at all  Food Insecurity: No Food Insecurity  (08/06/2023)   Received from [facility]   Hunger Vital Sign    Worried About Running Out of Food in the Last Year: Never true    Ran Out of Food in the Last Year: Never true  Transportation Needs: No Transportation Needs (08/06/2023)   Received from [facility] - Transportation    In the past 12 months, has lack of transportation kept you from medical appointments or from getting medications?: No    Lack of Transportation (Non-Medical): No  Physical Activity: Not on file  Stress: Not on file  Social Connections: Not on file  Intimate Partner Violence: Not on file    FAMILY HISTORY: Family History  Problem Relation Age of Onset   Colon cancer Mother        dx 89s   Other Brother        CLL and skin cancers   Breast cancer Maternal Aunt 32   Lung cancer Maternal Aunt    Breast cancer Paternal Aunt 53   Pancreatic cancer Paternal Aunt    Melanoma Maternal Uncle    Breast cancer Niece        dx 30s, "BRCA pos"    ALLERGIES:  is allergic to wound dressing adhesive and meloxicam.  MEDICATIONS:  Current Outpatient Medications  Medication Sig Dispense Refill   anastrozole (ARIMIDEX) 1 MG tablet TAKE 1 TABLET BY MOUTH DAILY 90 tablet 1   Calcium Carb-Cholecalciferol (CALCIUM 600 + D PO) Take 1 tablet by mouth 2 (two) times daily.     cetirizine (ZYRTEC) 10 MG tablet Take 10 mg by mouth daily.     CO-ENZYME Q-10 PO Take 100 mg by mouth daily.     Glucosamine-Chondroitin (MOVE FREE PO) Take 1 tablet by mouth daily.     ibuprofen (ADVIL) 200 MG tablet Take 400 mg by mouth every 6 (six) hours as needed for mild pain.     Krill Oil 350 MG CAPS Take 350 mg by mouth daily.     levocetirizine (XYZAL) 2.5 MG/5ML solution Take 2.5 mg by mouth every evening.     magnesium chloride (SLOW-MAG) 64 MG TBEC SR tablet Take 1 tablet by mouth 2 (two) times daily.     Misc Natural Products (AIRBORNE ELDERBERRY PO) Take by mouth.     Multiple Vitamin  (MULTIVITAMIN) capsule Take 1 capsule by mouth daily. Centrum Silver  Pamabrom 50 MG CAPS Take 50 mg by mouth daily as needed (water gain).     Phenylephrine-Acetaminophen (TYLENOL SINUS+HEADACHE) 5-325 MG TABS Take 1 tablet by mouth daily as needed (Headache).     Polyvinyl Alcohol-Povidone (CLEAR EYES ALL SEASONS) 5-6 MG/ML SOLN Place 1 drop into both eyes daily as needed (Dry eyes).     Probiotic Product (PROBIOTIC-10 PO) Take 1 tablet by mouth daily.     rosuvastatin (CRESTOR) 5 MG tablet Take 5 mg by mouth daily.     Turmeric (QC TUMERIC COMPLEX PO) Take by mouth.     COLLAGEN PO Take by mouth. (Patient not taking: Reported on 08/10/2022)     No current facility-administered medications for this visit.    PHYSICAL EXAMINATION: ECOG PERFORMANCE STATUS: 1 - Symptomatic but completely ambulatory Vitals:   09/03/23 0938  BP: 133/69  Pulse: 71  Temp: 97.7 F (36.5 C)  SpO2: 97%   Filed Weights   09/03/23 0938  Weight: 186 lb (84.4 kg)   Physical Exam Vitals reviewed.  Constitutional:      Appearance: She is not ill-appearing.     Comments: unaccompanied  HENT:     Head: Normocephalic and atraumatic.     Mouth/Throat:     Pharynx: Oropharynx is clear.  Cardiovascular:     Rate and Rhythm: Normal rate and regular rhythm.  Pulmonary:     Effort: No respiratory distress.  Abdominal:     General: There is no distension.     Tenderness: There is no guarding.  Skin:    General: Skin is warm.     Coloration: Skin is not pale.  Neurological:     Mental Status: She is alert and oriented to person, place, and time.  Psychiatric:        Mood and Affect: Mood normal.        Behavior: Behavior normal.    LABORATORY DATA:  I have reviewed the data as listed Lab Results  Component Value Date   WBC 4.7 09/03/2023   HGB 11.1 (L) 09/03/2023   HCT 36.5 09/03/2023   MCV 80.0 09/03/2023   PLT 323 09/03/2023   Recent Labs    02/08/23 1403 08/12/23 1321 08/12/23 1524  NA  139 135 134*  K 4.5 4.0 4.1  CL 106 104 101  CO2 26 24 25   GLUCOSE 91 106* 108*  BUN 18 23 23   CREATININE 0.67 0.60 0.55  CALCIUM 9.1 8.7* 9.2  GFRNONAA >60 >60 >60  PROT 7.1 6.8 7.0  ALBUMIN 4.1 3.9 3.9  AST 24 19 19   ALT 23 18 17   ALKPHOS 54 50 50  BILITOT 0.5 0.3 0.4   Iron/TIBC/Ferritin/ %Sat    Component Value Date/Time   IRON 9 (L) 08/12/2023 1524   TIBC 566 (H) 08/12/2023 1524   FERRITIN 2 (L) 08/12/2023 1524   IRONPCTSAT 2 (L) 08/12/2023 1524    RADIOGRAPHIC STUDIES: I have personally reviewed the radiological images as listed and agreed with the findings in the report. No results found.  ASSESSMENT & PLAN:   Iron Deficiency Anemia- etiology unclear but suspicious for GI Blood loss vs inadequate intake d/t restrictive diet for her IBS-D. S/p venofer in ER and blood transfusion. Hemoglobin 5.8 (08/12/23). No hx of anemia previously. Baseline hmg 12-13. More suspicious of acute blood loss rather than chronic. S/p pRBCs and Venofer in ER. Hmg improving, now 11.1. Repeat iron studies 08/12/23 showed persistent iron deficiency with ferritin 2, iron sat 2%. Recommend venofer tomorrow.  Hold transfusion. Plan to recheck counts and follow up with Dr. Donneta Romberg in 2 weeks as planned.  Etiology- hx of IBS with diarrhea. Last colonoscopy June 2023. She has appt with Gi next week. Recommend colonoscopy and endoscopy. Poss capsule. Denies vaginal bleeding or hematuria.  Hx of melanoma in situ- managed by dermatology/Dr Roseanne Reno.  Stage I T1c ER Positive, PR negative, HER2 negative Breast cancer of right breast- s/p lumpectomy with clear margins. Low risk oncotype. S/p RT. June 2022. On anastrozole. May 2024 mammogram was bi-rads 2: benign. Continue AI. Plan to follow up with Dr. Donneta Romberg.   Disposition:  Cancel transfusion tomorrow; Give venofer instead Follow up with Dr. Donneta Romberg as scheduled with labs (cbc, cmp, ferritin, iron studies, hold tube, b12) - la  No problem-specific  Assessment & Plan notes found for this encounter.  All questions were answered. The patient/family knows to call the clinic with any problems, questions or concerns.   Alinda Dooms, NP 09/03/2023   CC: Dr. Mia Creek

## 2023-09-04 ENCOUNTER — Inpatient Hospital Stay: Payer: Medicare Other

## 2023-09-05 ENCOUNTER — Other Ambulatory Visit: Payer: Self-pay | Admitting: *Deleted

## 2023-09-05 ENCOUNTER — Encounter: Payer: Self-pay | Admitting: Nurse Practitioner

## 2023-09-05 ENCOUNTER — Inpatient Hospital Stay: Payer: Medicare Other

## 2023-09-05 VITALS — BP 138/72 | HR 74 | Temp 97.8°F | Resp 16

## 2023-09-05 DIAGNOSIS — R319 Hematuria, unspecified: Secondary | ICD-10-CM

## 2023-09-05 DIAGNOSIS — C50211 Malignant neoplasm of upper-inner quadrant of right female breast: Secondary | ICD-10-CM | POA: Diagnosis not present

## 2023-09-05 LAB — URINALYSIS, COMPLETE (UACMP) WITH MICROSCOPIC
Bacteria, UA: NONE SEEN
Bilirubin Urine: NEGATIVE
Glucose, UA: NEGATIVE mg/dL
Ketones, ur: NEGATIVE mg/dL
Nitrite: NEGATIVE
Protein, ur: NEGATIVE mg/dL
Specific Gravity, Urine: 1.011 (ref 1.005–1.030)
Squamous Epithelial / HPF: 0 /[HPF] (ref 0–5)
pH: 6 (ref 5.0–8.0)

## 2023-09-05 MED ORDER — IRON SUCROSE 20 MG/ML IV SOLN
200.0000 mg | Freq: Once | INTRAVENOUS | Status: AC
  Administered 2023-09-05: 200 mg via INTRAVENOUS
  Filled 2023-09-05: qty 10

## 2023-09-05 NOTE — Patient Instructions (Signed)
Iron Sucrose Injection What is this medication? IRON SUCROSE (EYE ern SOO krose) treats low levels of iron (iron deficiency anemia) in people with kidney disease. Iron is a mineral that plays an important role in making red blood cells, which carry oxygen from your lungs to the rest of your body. This medicine may be used for other purposes; ask your health care provider or pharmacist if you have questions. COMMON BRAND NAME(S): Venofer What should I tell my care team before I take this medication? They need to know if you have any of these conditions: Anemia not caused by low iron levels Heart disease High levels of iron in the blood Kidney disease Liver disease An unusual or allergic reaction to iron, other medications, foods, dyes, or preservatives Pregnant or trying to get pregnant Breastfeeding How should I use this medication? This medication is for infusion into a vein. It is given in a hospital or clinic setting. Talk to your care team about the use of this medication in children. While this medication may be prescribed for children as young as 2 years for selected conditions, precautions do apply. Overdosage: If you think you have taken too much of this medicine contact a poison control center or emergency room at once. NOTE: This medicine is only for you. Do not share this medicine with others. What if I miss a dose? Keep appointments for follow-up doses. It is important not to miss your dose. Call your care team if you are unable to keep an appointment. What may interact with this medication? Do not take this medication with any of the following: Deferoxamine Dimercaprol Other iron products This medication may also interact with the following: Chloramphenicol Deferasirox This list may not describe all possible interactions. Give your health care provider a list of all the medicines, herbs, non-prescription drugs, or dietary supplements you use. Also tell them if you smoke,  drink alcohol, or use illegal drugs. Some items may interact with your medicine. What should I watch for while using this medication? Visit your care team regularly. Tell your care team if your symptoms do not start to get better or if they get worse. You may need blood work done while you are taking this medication. You may need to follow a special diet. Talk to your care team. Foods that contain iron include: whole grains/cereals, dried fruits, beans, or peas, leafy green vegetables, and organ meats (liver, kidney). What side effects may I notice from receiving this medication? Side effects that you should report to your care team as soon as possible: Allergic reactions--skin rash, itching, hives, swelling of the face, lips, tongue, or throat Low blood pressure--dizziness, feeling faint or lightheaded, blurry vision Shortness of breath Side effects that usually do not require medical attention (report to your care team if they continue or are bothersome): Flushing Headache Joint pain Muscle pain Nausea Pain, redness, or irritation at injection site This list may not describe all possible side effects. Call your doctor for medical advice about side effects. You may report side effects to FDA at 1-800-FDA-1088. Where should I keep my medication? This medication is given in a hospital or clinic. It will not be stored at home. NOTE: This sheet is a summary. It may not cover all possible information. If you have questions about this medicine, talk to your doctor, pharmacist, or health care provider.  2024 Elsevier/Gold Standard (2023-03-15 00:00:00)

## 2023-09-06 LAB — URINE CULTURE: Culture: NO GROWTH

## 2023-09-16 ENCOUNTER — Inpatient Hospital Stay: Payer: Medicare Other

## 2023-09-16 ENCOUNTER — Encounter: Payer: Self-pay | Admitting: Internal Medicine

## 2023-09-16 ENCOUNTER — Inpatient Hospital Stay (HOSPITAL_BASED_OUTPATIENT_CLINIC_OR_DEPARTMENT_OTHER): Payer: Medicare Other | Admitting: Internal Medicine

## 2023-09-16 VITALS — BP 129/71 | HR 80 | Temp 98.0°F | Ht 62.0 in | Wt 185.6 lb

## 2023-09-16 DIAGNOSIS — C50211 Malignant neoplasm of upper-inner quadrant of right female breast: Secondary | ICD-10-CM

## 2023-09-16 DIAGNOSIS — Z170 Estrogen receptor positive status [ER+]: Secondary | ICD-10-CM

## 2023-09-16 DIAGNOSIS — D509 Iron deficiency anemia, unspecified: Secondary | ICD-10-CM

## 2023-09-16 DIAGNOSIS — D649 Anemia, unspecified: Secondary | ICD-10-CM

## 2023-09-16 LAB — SAMPLE TO BLOOD BANK

## 2023-09-16 LAB — CBC WITH DIFFERENTIAL/PLATELET
Abs Immature Granulocytes: 0.01 10*3/uL (ref 0.00–0.07)
Basophils Absolute: 0.1 10*3/uL (ref 0.0–0.1)
Basophils Relative: 1 %
Eosinophils Absolute: 0.1 10*3/uL (ref 0.0–0.5)
Eosinophils Relative: 2 %
HCT: 37.2 % (ref 36.0–46.0)
Hemoglobin: 11.7 g/dL — ABNORMAL LOW (ref 12.0–15.0)
Immature Granulocytes: 0 %
Lymphocytes Relative: 19 %
Lymphs Abs: 1.1 10*3/uL (ref 0.7–4.0)
MCH: 25.7 pg — ABNORMAL LOW (ref 26.0–34.0)
MCHC: 31.5 g/dL (ref 30.0–36.0)
MCV: 81.6 fL (ref 80.0–100.0)
Monocytes Absolute: 0.4 10*3/uL (ref 0.1–1.0)
Monocytes Relative: 8 %
Neutro Abs: 4 10*3/uL (ref 1.7–7.7)
Neutrophils Relative %: 70 %
Platelets: 194 10*3/uL (ref 150–400)
RBC: 4.56 MIL/uL (ref 3.87–5.11)
RDW: 24.9 % — ABNORMAL HIGH (ref 11.5–15.5)
WBC: 5.7 10*3/uL (ref 4.0–10.5)
nRBC: 0 % (ref 0.0–0.2)

## 2023-09-16 LAB — COMPREHENSIVE METABOLIC PANEL
ALT: 23 U/L (ref 0–44)
AST: 27 U/L (ref 15–41)
Albumin: 4.2 g/dL (ref 3.5–5.0)
Alkaline Phosphatase: 47 U/L (ref 38–126)
Anion gap: 12 (ref 5–15)
BUN: 18 mg/dL (ref 8–23)
CO2: 26 mmol/L (ref 22–32)
Calcium: 9.3 mg/dL (ref 8.9–10.3)
Chloride: 103 mmol/L (ref 98–111)
Creatinine, Ser: 0.53 mg/dL (ref 0.44–1.00)
GFR, Estimated: >60 mL/min (ref >60)
Glucose, Bld: 122 mg/dL — ABNORMAL HIGH (ref 70–99)
Potassium: 4.2 mmol/L (ref 3.5–5.1)
Sodium: 141 mmol/L (ref 135–145)
Total Bilirubin: 0.4 mg/dL (ref <1.2)
Total Protein: 6.8 g/dL (ref 6.5–8.1)

## 2023-09-16 LAB — IRON AND TIBC
Iron: 115 ug/dL (ref 28–170)
Saturation Ratios: 31 % (ref 10.4–31.8)
TIBC: 372 ug/dL (ref 250–450)
UIBC: 257 ug/dL

## 2023-09-16 LAB — FERRITIN: Ferritin: 123 ng/mL (ref 11–307)

## 2023-09-16 MED ORDER — IRON SUCROSE 20 MG/ML IV SOLN
200.0000 mg | Freq: Once | INTRAVENOUS | Status: AC
  Administered 2023-09-16: 200 mg via INTRAVENOUS

## 2023-09-16 NOTE — Progress Notes (Signed)
 No questions/concerns today.

## 2023-09-16 NOTE — Progress Notes (Signed)
[facility] CONSULT NOTE  Patient Care Team: Patrice Paradise, MD as PCP - General (Physician Assistant) Scarlett Presto, RN (Inactive) as Oncology Nurse Navigator Carmina Miller, MD as Consulting Physician (Radiation Oncology) Earna Coder, MD as Consulting Physician (Internal Medicine) Earline Mayotte, MD as Consulting Physician (General Surgery)  CHIEF COMPLAINTS/PURPOSE OF CONSULTATION: Breast cancer  Oncology History Overview Note  # June 2022- Targeted ultrasound was performed of the RIGHT upper inner breast. At 1:30 8 cm from the nipple, there is an irregular hypoechoic mass with indistinct margins. It measures approximately 10 x 5 x 6 mm. This corresponds to the site of screening mammographic concern.   Targeted ultrasound was performed of the RIGHT axilla. No suspicious axillary lymph nodes are seen.   IMPRESSION: 1. There is a 10 mm mass in the RIGHT upper inner breast which is concerning for malignancy. Recommend ultrasound-guided biopsy for definitive characterization.  DIAGNOSIS:  A. BREAST, RIGHT, 1:30 O'CLOCK 8 CM FROM NIPPLE; ULTRASOUND-GUIDED CORE  BIOPSY:  - INVASIVE MAMMARY CARCINOMA, NO SPECIAL TYPE.   Size of invasive carcinoma: 7 mm in this sample  Histologic grade of invasive carcinoma: Grade 2                       Glandular/tubular differentiation score: 3                       Nuclear pleomorphism score: 2                       Mitotic rate score: 1                       Total score: 6  Ductal carcinoma in situ: Present, intermediate grade  Lymphovascular invasion: Not identified   CASE SUMMARY: BREAST BIOMARKER TESTS  Estrogen Receptor (ER) Status: POSITIVE          Percentage of cells with nuclear positivity: Greater than 90%          Average intensity of staining: Strong   Progesterone Receptor (PgR) Status: NEGATIVE (less than 1%)          Internal control cells present and stain as expected   HER2 (by  immunohistochemistry): NEGATIVE (Score 0)  TNM Descriptors: Not applicable  pT1c  Regional Lymph Nodes Modifier: Not applicable  pN0  pM - Not applicable  #  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   #Right breast cancer status postlumpectomy-Oncotype low risk; no chemotherapy.  # NOV 14th, 2022-start anastrozole for 5 years.   Carcinoma of upper-inner quadrant of right breast in female, estrogen receptor positive (HCC)  04/18/2021 Initial Diagnosis   Carcinoma of upper-inner quadrant of right breast in female, estrogen receptor positive (HCC)    Genetic Testing   Negative genetic testing. No pathogenic variants identified on the [employer] CancerNext-Expanded+RNA Panel. The report date is 04/30/2021.   The CancerNext-Expanded + RNAinsight gene panel offered by [employer] and includes sequencing and rearrangement analysis for the following 77 genes: IP, ALK, APC*, ATM*, AXIN2, BAP1, BARD1, BLM, BMPR1A, BRCA1*, BRCA2*, BRIP1*, CDC73, CDH1*,CDK4, CDKN1B, CDKN2A, CHEK2*, CTNNA1, DICER1, FANCC, FH, FLCN, GALNT12, KIF1B, LZTR1, MAX, MEN1, MET, MLH1*, MSH2*, MSH3, MSH6*, MUTYH*, NBN, NF1*, NF2, NTHL1, PALB2*, PHOX2B, PMS2*, POT1, PRKAR1A, PTCH1, PTEN*, RAD51C*, RAD51D*,RB1, RECQL, RET, SDHA, SDHAF2, SDHB, SDHC, SDHD, SMAD4, SMARCA4, SMARCB1, SMARCE1, STK11, SUFU, TMEM127, TP53*,TSC1, TSC2, VHL and XRCC2 (sequencing and deletion/duplication); EGFR, EGLN1,  HOXB13, KIT, MITF, PDGFRA, POLD1 and POLE (sequencing only); EPCAM and GREM1 (deletion/duplication only).   05/25/2021 Cancer Staging   Staging form: Breast, AJCC 8th Edition - Pathologic: Stage IA (pT1c, pN0, cM0, G2, ER+, PR-, HER2-) - Signed by Earna Coder, MD on 05/25/2021 Histologic grading system: 3 grade system     HISTORY OF PRESENTING ILLNESS: Ambulating independently.  Alone.  Ward [name] 67 y.o.  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  At the last visit approximately month ago patient was sent to emergency room for severe anemia.  Patient needed for transfusion.  Also status post iron infusion.  Status post GI evaluation.  Denies any blood in stools or black-colored stool.  Patient denies any worsening hot flashes or joint pains.  Denies any nausea vomiting.   Review of Systems  Constitutional:  Positive for malaise/fatigue. Negative for chills, diaphoresis and fever.  HENT:  Negative for nosebleeds and sore throat.   Eyes:  Negative for double vision.  Respiratory:  Positive for shortness of breath. Negative for hemoptysis, sputum production and wheezing.   Cardiovascular:  Negative for chest pain, palpitations, orthopnea and leg swelling.  Gastrointestinal:  Negative for blood in stool, constipation, diarrhea, heartburn, melena and vomiting.  Genitourinary:  Negative for dysuria, frequency and urgency.  Musculoskeletal:  Positive for joint pain. Negative for back pain.  Skin: Negative.  Negative for itching and rash.  Neurological:  Negative for dizziness, tingling, focal weakness, weakness and headaches.  Endo/Heme/Allergies:  Does not bruise/bleed easily.  Psychiatric/Behavioral:  Negative for depression. The patient is not nervous/anxious and does not have insomnia.      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 Excised: 04/21/2010   Personal history of radiation therapy    Pre-diabetes    Wears contact lenses     SURGICAL HISTORY: Past Surgical History:  Procedure Laterality Date   BREAST BIOPSY Right 1987, 2013   fibroadenoma x 2    BREAST BIOPSY Right 04/06/2021   u/s bx-"venus" clip-positive   BREAST EXCISIONAL BIOPSY Left 1978   BREAST FIBROADENOMA SURGERY     BREAST LUMPECTOMY WITH SENTINEL LYMPH NODE BIOPSY Right 05/08/2021   Procedure: BREAST LUMPECTOMY WITH SENTINEL LYMPH NODE BX;  Surgeon: Earline Mayotte, MD;  Location: [facility] ORS;  Service: General;  Laterality: Right;   CARPAL TUNNEL RELEASE Right 08/28/2017   Procedure: CARPAL TUNNEL RELEASE ENDOSCOPIC;  Surgeon: Christena Flake, MD;  Location: [facility];  Service: Orthopedics;  Laterality: Right;   CARPAL TUNNEL RELEASE Left 11/06/2017   Procedure: CARPAL TUNNEL RELEASE ENDOSCOPIC;  Surgeon: Christena Flake, MD;  Location: [facility];  Service: Orthopedics;  Laterality: Left;   COLONOSCOPY N/A 09/26/2015   Procedure: COLONOSCOPY;  Surgeon: Scot Jun, MD;  Location: [facility];  Service: Endoscopy;  Laterality: N/A;   COLONOSCOPY WITH PROPOFOL N/A 04/16/2022   Procedure: COLONOSCOPY WITH PROPOFOL;  Surgeon: Regis Bill, MD;  Location: [facility] ENDOSCOPY;  Service: Endoscopy;  Laterality: N/A;   TOOTH EXTRACTION      SOCIAL HISTORY: Social History   Socioeconomic History   Marital status: Single    Spouse name: Not on file   Number of children: Not on file   Years of education: Not on file   Highest education level: Not on file  Occupational History   Not on file  Tobacco Use   Smoking status: Never   Smokeless tobacco: Never  Vaping Use   Vaping status: Never Used  Substance and Sexual Activity   Alcohol use: Yes    Alcohol/week: 0.0 standard drinks of alcohol    Comment: may have a drink 3-4x/yr   Drug use: No   Sexual activity: Not on file  Other Topics Concern   Not on file  Social History Narrative   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 No smoking; lives in [place]; works in [place].    Social Determinants of Health   Financial Resource Strain: Low Risk  (08/06/2023)   Received from [facility]   Overall Financial Resource Strain (CARDIA)    Difficulty of Paying Living Expenses: Not hard at all  Food Insecurity: No Food Insecurity (08/06/2023)   Received from [facility]   Hunger Vital Sign    Worried About Running Out of Food in the Last Year: Never true    Ran Out of Food in the Last Year: Never true  Transportation Needs: No Transportation Needs (08/06/2023)   Received [facility] - Transportation    In the past 12 months, has lack of transportation kept you from medical appointments or from getting medications?: No    Lack of Transportation (Non-Medical): No  Physical Activity: Not on file  Stress: Not on file  Social Connections: Not on file  Intimate Partner Violence: Not on file    FAMILY HISTORY: Family History  Problem Relation Age of Onset   Colon cancer Mother        dx 13s   Other Brother        CLL and skin cancers   Breast cancer Maternal Aunt 22   Lung cancer Maternal Aunt    Breast cancer Paternal Aunt 83   Pancreatic cancer Paternal Aunt    Melanoma Maternal Uncle    Breast cancer Niece        dx 30s, "BRCA pos"    ALLERGIES:  is allergic to wound dressing adhesive and meloxicam.  MEDICATIONS:  Current Outpatient Medications  Medication Sig Dispense Refill   anastrozole (ARIMIDEX) 1 MG tablet TAKE 1 TABLET BY MOUTH DAILY 90 tablet 1   Calcium Carb-Cholecalciferol (CALCIUM 600 + D PO) Take 1 tablet by mouth 2 (two) times daily.     cetirizine (ZYRTEC) 10 MG tablet Take 10 mg by mouth daily.     CO-ENZYME Q-10 PO Take 100 mg by mouth daily.     Fe Bisgly-Vit C-Vit B12-FA (GENTLE IRON PO) Take 1 capsule by mouth daily.     Glucosamine-Chondroitin (MOVE FREE PO) Take 1 tablet by mouth daily.     ibuprofen (ADVIL) 200 MG tablet Take 400 mg by mouth every 6 (six) hours as needed for mild pain.     Krill Oil 350 MG CAPS Take 350 mg by mouth daily.     levocetirizine (XYZAL) 2.5 MG/5ML  solution Take 2.5 mg by mouth every evening.     magnesium chloride (SLOW-MAG) 64 MG TBEC SR  tablet Take 1 tablet by mouth 2 (two) times daily.     Misc Natural Products (AIRBORNE ELDERBERRY PO) Take by mouth.     Multiple Vitamin (MULTIVITAMIN) capsule Take 1 capsule by mouth daily. Centrum Silver     Pamabrom 50 MG CAPS Take 50 mg by mouth daily as needed (water gain).     Phenylephrine-Acetaminophen (TYLENOL SINUS+HEADACHE) 5-325 MG TABS Take 1 tablet by mouth daily as needed (Headache).     Polyvinyl Alcohol-Povidone (CLEAR EYES ALL SEASONS) 5-6 MG/ML SOLN Place 1 drop into both eyes daily as needed (Dry eyes).     Probiotic Product (PROBIOTIC-10 PO) Take 1 tablet by mouth daily.     rosuvastatin (CRESTOR) 5 MG tablet Take 5 mg by mouth daily.     Turmeric (QC TUMERIC COMPLEX PO) Take by mouth.     No current facility-administered medications for this visit.      Marland Kitchen  PHYSICAL EXAMINATION: ECOG PERFORMANCE STATUS: 0 - Asymptomatic  Vitals:   09/16/23 1302  BP: 129/71  Pulse: 80  Temp: 98 F (36.7 C)  SpO2: 97%    Filed Weights   09/16/23 1302  Weight: 185 lb 9.6 oz (84.2 kg)     Physical Exam Vitals and nursing note reviewed.  HENT:     Head: Normocephalic and atraumatic.     Mouth/Throat:     Pharynx: Oropharynx is clear.  Eyes:     Extraocular Movements: Extraocular movements intact.     Pupils: Pupils are equal, round, and reactive to light.  Cardiovascular:     Rate and Rhythm: Normal rate and regular rhythm.  Pulmonary:     Comments: Decreased breath sounds bilaterally.  Abdominal:     Palpations: Abdomen is soft.  Musculoskeletal:        General: Normal range of motion.     Cervical back: Normal range of motion.  Skin:    General: Skin is warm.  Neurological:     General: No focal deficit present.     Mental Status: She is alert and oriented to person, place, and time.  Psychiatric:        Behavior: Behavior normal.        Judgment: Judgment normal.       LABORATORY DATA:  I have reviewed the data as listed Lab Results  Component Value Date   WBC 5.7 09/16/2023   HGB 11.7 (L) 09/16/2023   HCT 37.2 09/16/2023   MCV 81.6 09/16/2023   PLT 194 09/16/2023   Recent Labs    08/12/23 1321 08/12/23 1524 09/16/23 1243  NA 135 134* 141  K 4.0 4.1 4.2  CL 104 101 103  CO2 24 25 26   GLUCOSE 106* 108* 122*  BUN 23 23 18   CREATININE 0.60 0.55 0.53  CALCIUM 8.7* 9.2 9.3  GFRNONAA >60 >60 >60  PROT 6.8 7.0 6.8  ALBUMIN 3.9 3.9 4.2  AST 19 19 27   ALT 18 17 23   ALKPHOS 50 50 47  BILITOT 0.3 0.4 0.4     RADIOGRAPHIC STUDIES: I have personally reviewed the radiological images as listed and agreed with the findings in the report. No results found.  ASSESSMENT & PLAN:   Carcinoma of upper-inner quadrant of right breast in female, estrogen receptor positive (HCC) #Stage I T1c- ER positive; PR negative; her 2 negative; s/p lumpectomy clear margins [Dr,Byrnett/ Dr.Sam ]. ONCOTYPE-LOW RISK- s/p RT. June 2022. MAy 2024 [Dr.Cintron]- Bil Mamm-WNL; stable.   # ON anastrazole- tolerating well except for hot  flashes- see below.  # Hot flashes grade 1-recommend monitor for now; stable.   # severe anemia- hb- 6.0 [OCT, 2024]- sec to severe k iron studies;ferritin- [June 2023- colo, Dr.Locklear; no EGD]- colonoscopy and endoscopy. Poss capsule. On 1/20 [Dr.Locklear]. awaiting Hb- with physical in dec; if worse consider Hb check prior to colo- pt wil call if needed.    # BMD- OCT 2024- [Dr.Moriyati; [facility]]-The BMD measured at Femur Neck Right is 0.940 g/cm2 with a T-score of -0.7.  Continue Calcium plus vitamin D; weightbearing exercises.   stable  # DISPOSITION: # venofer today # cancel dec iron infusion # Follow up in 3 months  MD; cbc/cmp; iron studies; ferritin- possible venofer-- -Dr.B   All questions were answered. The patient/family knows to call the clinic with any problems, questions or concerns.    Earna Coder, MD 09/16/2023 1:33 PM

## 2023-09-16 NOTE — Patient Instructions (Signed)
Iron Sucrose Injection What is this medication? IRON SUCROSE (EYE ern SOO krose) treats low levels of iron (iron deficiency anemia) in people with kidney disease. Iron is a mineral that plays an important role in making red blood cells, which carry oxygen from your lungs to the rest of your body. This medicine may be used for other purposes; ask your health care provider or pharmacist if you have questions. COMMON BRAND NAME(S): Venofer What should I tell my care team before I take this medication? They need to know if you have any of these conditions: Anemia not caused by low iron levels Heart disease High levels of iron in the blood Kidney disease Liver disease An unusual or allergic reaction to iron, other medications, foods, dyes, or preservatives Pregnant or trying to get pregnant Breastfeeding How should I use this medication? This medication is for infusion into a vein. It is given in a hospital or clinic setting. Talk to your care team about the use of this medication in children. While this medication may be prescribed for children as young as 2 years for selected conditions, precautions do apply. Overdosage: If you think you have taken too much of this medicine contact a poison control center or emergency room at once. NOTE: This medicine is only for you. Do not share this medicine with others. What if I miss a dose? Keep appointments for follow-up doses. It is important not to miss your dose. Call your care team if you are unable to keep an appointment. What may interact with this medication? Do not take this medication with any of the following: Deferoxamine Dimercaprol Other iron products This medication may also interact with the following: Chloramphenicol Deferasirox This list may not describe all possible interactions. Give your health care provider a list of all the medicines, herbs, non-prescription drugs, or dietary supplements you use. Also tell them if you smoke,  drink alcohol, or use illegal drugs. Some items may interact with your medicine. What should I watch for while using this medication? Visit your care team regularly. Tell your care team if your symptoms do not start to get better or if they get worse. You may need blood work done while you are taking this medication. You may need to follow a special diet. Talk to your care team. Foods that contain iron include: whole grains/cereals, dried fruits, beans, or peas, leafy green vegetables, and organ meats (liver, kidney). What side effects may I notice from receiving this medication? Side effects that you should report to your care team as soon as possible: Allergic reactions--skin rash, itching, hives, swelling of the face, lips, tongue, or throat Low blood pressure--dizziness, feeling faint or lightheaded, blurry vision Shortness of breath Side effects that usually do not require medical attention (report to your care team if they continue or are bothersome): Flushing Headache Joint pain Muscle pain Nausea Pain, redness, or irritation at injection site This list may not describe all possible side effects. Call your doctor for medical advice about side effects. You may report side effects to FDA at 1-800-FDA-1088. Where should I keep my medication? This medication is given in a hospital or clinic. It will not be stored at home. NOTE: This sheet is a summary. It may not cover all possible information. If you have questions about this medicine, talk to your doctor, pharmacist, or health care provider.  2024 Elsevier/Gold Standard (2023-03-15 00:00:00)

## 2023-09-16 NOTE — Assessment & Plan Note (Addendum)
#  Stage I T1c- ER positive; PR negative; her 2 negative; s/p lumpectomy clear margins [Dr,Byrnett/ Dr.Sam ]. ONCOTYPE-LOW RISK- s/p RT. June 2022. MAy 2024 [Dr.Cintron]- Bil Mamm-WNL; stable.   # ON anastrazole- tolerating well except for hot flashes- see below.  # Hot flashes grade 1-recommend monitor for now; stable.   # severe anemia- hb- 6.0 [OCT, 2024]- sec to severe k iron studies;ferritin- [June 2023- colo, Dr.Locklear; no EGD]- colonoscopy and endoscopy. Poss capsule. On 1/20 [Dr.Locklear]. awaiting Hb- with physical in dec; if worse consider Hb check prior to colo- pt wil call if needed.    # BMD- OCT 2024- [Dr.Moriyati; [facility]]-The BMD measured at Femur Neck Right is 0.940 g/cm2 with a T-score of -0.7.  Continue Calcium plus vitamin D; weightbearing exercises.   stable  # DISPOSITION: # venofer today # cancel dec iron infusion # Follow up in 3 months  MD; cbc/cmp; iron studies; ferritin- possible venofer-- -Dr.B

## 2023-09-17 LAB — VITAMIN B12: Vitamin B-12: 478 pg/mL (ref 180–914)

## 2023-09-24 ENCOUNTER — Ambulatory Visit
Admission: RE | Admit: 2023-09-24 | Discharge: 2023-09-24 | Disposition: A | Discharge status: Home / Self Care | Payer: Medicare Other | Source: Ambulatory Visit | Attending: Radiation Oncology | Admitting: Radiation Oncology

## 2023-09-24 ENCOUNTER — Encounter: Payer: Self-pay | Admitting: Radiation Oncology

## 2023-09-24 VITALS — BP 128/76 | HR 77 | Temp 97.6°F | Resp 16 | Wt 185.7 lb

## 2023-09-24 DIAGNOSIS — Z79811 Long term (current) use of aromatase inhibitors: Secondary | ICD-10-CM | POA: Diagnosis not present

## 2023-09-24 DIAGNOSIS — R197 Diarrhea, unspecified: Secondary | ICD-10-CM | POA: Diagnosis not present

## 2023-09-24 DIAGNOSIS — C50211 Malignant neoplasm of upper-inner quadrant of right female breast: Secondary | ICD-10-CM | POA: Diagnosis present

## 2023-09-24 DIAGNOSIS — Z923 Personal history of irradiation: Secondary | ICD-10-CM | POA: Insufficient documentation

## 2023-09-24 DIAGNOSIS — D509 Iron deficiency anemia, unspecified: Secondary | ICD-10-CM | POA: Insufficient documentation

## 2023-09-24 DIAGNOSIS — Z170 Estrogen receptor positive status [ER+]: Secondary | ICD-10-CM | POA: Insufficient documentation

## 2023-09-24 NOTE — Progress Notes (Signed)
Radiation Oncology Follow up Note  Name: Meghan Harper   Date:   09/24/2023 MRN:  [id number] DOB: [birth year]    This 67 y.o. female presents to the clinic today for 2-1/2-year follow-up status post whole breast radiation to her right breast for stage Ia ER positive invasive mammary carcinoma.  REFERRING PROVIDER: Patrice Paradise, MD  HPI: Patient is a 67 year old female now out over 2-1/2 years having completed whole breast radiation to her right breast for stage Ia ER positive invasive mammary carcinoma.  Seen today in routine follow-up from her breast standpoint she is doing well.  She specifically denies breast tenderness cough or bone pain..  She had mammograms back in May which I have reviewed were BI-RADS 2 benign.  She is currently on Arimidex tolerating it well without side effect.  She is recently been diagnosed with significant anemia iron deficiency.  She specifically [place] blood in her stools or black tarry stools.  She is scheduled for colonoscopy.  She is also been having episodes of extreme watery diarrhea.  She is being worked up to rule out GI malignancy.  COMPLICATIONS OF TREATMENT: none  FOLLOW UP COMPLIANCE: keeps appointments   PHYSICAL EXAM:  BP 128/76   Pulse 77   Temp 97.6 F (36.4 C) (Tympanic)   Resp 16   Wt 185 lb 11.2 oz (84.2 kg)   BMI 33.96 kg/m  Lungs are clear to A&P cardiac examination essentially unremarkable with regular rate and rhythm. No dominant mass or nodularity is noted in either breast in 2 positions examined. Incision is well-healed. No axillary or supraclavicular adenopathy is appreciated. Cosmetic result is excellent.  Well-developed well-nourished patient in NAD. HEENT reveals PERLA, EOMI, discs not visualized.  Oral cavity is clear. No oral mucosal lesions are identified. Neck is clear without evidence of cervical or supraclavicular adenopathy. Lungs are clear to A&P. Cardiac examination is essentially unremarkable with regular rate and  rhythm without murmur rub or thrill. Abdomen is benign with no organomegaly or masses noted. Motor sensory and DTR levels are equal and symmetric in the upper and lower extremities. Cranial nerves II through XII are grossly intact. Proprioception is intact. No peripheral adenopathy or edema is identified. No motor or sensory levels are noted. Crude visual fields are within normal range.  RADIOLOGY RESULTS: Mammograms reviewed compatible with above-stated findings  PLAN: Present time from a breast endpoint she is doing well with no evidence of disease now out close to 3 years.  I am going to discontinue follow-up care.  She continues close follow-up care with medical oncology.  She is also in the midst of workup to rule out GI malignancy based on profound iron deficiency anemia.  Patient is to call in the future with any concerns.  I would like to take this opportunity to thank you for allowing me to participate in the care of your patient.Meghan Miller, MD

## 2023-10-03 ENCOUNTER — Other Ambulatory Visit: Payer: Federal, State, Local not specified - PPO

## 2023-10-03 ENCOUNTER — Ambulatory Visit: Payer: Federal, State, Local not specified - PPO

## 2023-10-03 ENCOUNTER — Ambulatory Visit: Payer: Federal, State, Local not specified - PPO | Admitting: Nurse Practitioner

## 2023-11-11 ENCOUNTER — Ambulatory Visit: Payer: Medicare Other | Admitting: Certified Registered Nurse Anesthetist

## 2023-11-11 ENCOUNTER — Encounter: Payer: Self-pay | Admitting: *Deleted

## 2023-11-11 ENCOUNTER — Encounter
Admission: RE | Disposition: A | Discharge status: Home / Self Care | Payer: Self-pay | Source: Home / Self Care | Attending: Gastroenterology

## 2023-11-11 ENCOUNTER — Other Ambulatory Visit: Payer: Self-pay

## 2023-11-11 ENCOUNTER — Ambulatory Visit
Admission: RE | Admit: 2023-11-11 | Discharge: 2023-11-11 | Disposition: A | Discharge status: Home / Self Care | Payer: Medicare Other | Attending: Gastroenterology | Admitting: Gastroenterology

## 2023-11-11 DIAGNOSIS — Z6832 Body mass index (BMI) 32.0-32.9, adult: Secondary | ICD-10-CM | POA: Diagnosis not present

## 2023-11-11 DIAGNOSIS — Z853 Personal history of malignant neoplasm of breast: Secondary | ICD-10-CM | POA: Insufficient documentation

## 2023-11-11 DIAGNOSIS — Z800 Family history of malignant neoplasm of digestive organs: Secondary | ICD-10-CM | POA: Diagnosis not present

## 2023-11-11 DIAGNOSIS — E66813 Obesity, class 3: Secondary | ICD-10-CM | POA: Insufficient documentation

## 2023-11-11 DIAGNOSIS — K529 Noninfective gastroenteritis and colitis, unspecified: Secondary | ICD-10-CM | POA: Diagnosis not present

## 2023-11-11 DIAGNOSIS — D509 Iron deficiency anemia, unspecified: Secondary | ICD-10-CM | POA: Insufficient documentation

## 2023-11-11 DIAGNOSIS — K449 Diaphragmatic hernia without obstruction or gangrene: Secondary | ICD-10-CM | POA: Diagnosis not present

## 2023-11-11 DIAGNOSIS — K2270 Barrett's esophagus without dysplasia: Secondary | ICD-10-CM | POA: Insufficient documentation

## 2023-11-11 DIAGNOSIS — K640 First degree hemorrhoids: Secondary | ICD-10-CM | POA: Diagnosis not present

## 2023-11-11 HISTORY — PX: COLONOSCOPY WITH PROPOFOL: SHX5780

## 2023-11-11 HISTORY — PX: ESOPHAGOGASTRODUODENOSCOPY (EGD) WITH PROPOFOL: SHX5813

## 2023-11-11 HISTORY — PX: BIOPSY: SHX5522

## 2023-11-11 SURGERY — COLONOSCOPY WITH PROPOFOL
Anesthesia: General

## 2023-11-11 MED ORDER — SODIUM CHLORIDE 0.9 % IV SOLN: INTRAVENOUS | Status: DC

## 2023-11-11 MED ORDER — GLYCOPYRROLATE 0.2 MG/ML IJ SOLN
INTRAMUSCULAR | Status: DC | PRN
  Administered 2023-11-11: .2 mg via INTRAVENOUS

## 2023-11-11 MED ORDER — PROPOFOL 500 MG/50ML IV EMUL
INTRAVENOUS | Status: DC | PRN
  Administered 2023-11-11: 150 ug/kg/min via INTRAVENOUS

## 2023-11-11 MED ORDER — LIDOCAINE HCL (CARDIAC) PF 100 MG/5ML IV SOSY
PREFILLED_SYRINGE | INTRAVENOUS | Status: DC | PRN
  Administered 2023-11-11: 100 mg via INTRAVENOUS

## 2023-11-11 MED ORDER — PROPOFOL 10 MG/ML IV BOLUS
INTRAVENOUS | Status: DC | PRN
  Administered 2023-11-11: 20 mg via INTRAVENOUS
  Administered 2023-11-11: 70 mg via INTRAVENOUS

## 2023-11-11 NOTE — Op Note (Signed)
[facility] Patient Name: Meghan Harper Procedure Date: 11/11/2023 10:58 AM MRN: [id number] Account #: [id number] Date of Birth: 02/15/1956 Admit Type: Outpatient Age: 68 Room: [facility] ENDO ROOM 3 Gender: Female Note Status: Finalized Instrument Name: Patton Salles Endoscope [id number] Procedure:             Upper GI endoscopy Indications:           Iron deficiency anemia Providers:             Eather Colas MD, MD Medicines:             Monitored Anesthesia Care Complications:         No immediate complications. Estimated blood loss:                         Minimal. Procedure:             Pre-Anesthesia Assessment:                        - Prior to the procedure, a History and Physical was                         performed, and patient medications and allergies were                         reviewed. The patient is competent. The risks and                         benefits of the procedure and the sedation options and                         risks were discussed with the patient. All questions                         were answered and informed consent was obtained.                         Patient identification and proposed procedure were                         verified by the physician, the nurse, the                         anesthesiologist, the anesthetist and the technician                         in the endoscopy suite. Mental Status Examination:                         alert and oriented. Airway Examination: normal                         oropharyngeal airway and neck mobility. Respiratory                         Examination: clear to auscultation. CV Examination:                         normal. Prophylactic Antibiotics: The patient does not  require prophylactic antibiotics. Prior                         Anticoagulants: The patient has taken no anticoagulant                         or antiplatelet agents. ASA Grade Assessment: II  - A                         patient with mild systemic disease. After reviewing                         the risks and benefits, the patient was deemed in                         satisfactory condition to undergo the procedure. The                         anesthesia plan was to use monitored anesthesia care                         (MAC). Immediately prior to administration of                         medications, the patient was re-assessed for adequacy                         to receive sedatives. The heart rate, respiratory                         rate, oxygen saturations, blood pressure, adequacy of                         pulmonary ventilation, and response to care were                         monitored throughout the procedure. The physical                         status of the patient was re-assessed after the                         procedure.                        After obtaining informed consent, the endoscope was                         passed under direct vision. Throughout the procedure,                         the patient's blood pressure, pulse, and oxygen                         saturations were monitored continuously. The Endoscope                         was introduced through the mouth, and advanced to the  second part of duodenum. The upper GI endoscopy was                         accomplished without difficulty. The patient tolerated                         the procedure well. Findings:      A single small nodule with a localized distribution was found at the       gastroesophageal junction. Biopsies were taken with a cold forceps for       histology. Estimated blood loss was minimal.      A 6 cm hiatal hernia was present.      The exam of the esophagus was otherwise normal.      The entire examined stomach was normal. Biopsies were taken with a cold       forceps for Helicobacter pylori testing. Estimated blood loss was       minimal.      The  examined duodenum was normal. Impression:            - Nodule found in the esophagus. Biopsied.                        - 6 cm hiatal hernia.                        - Normal stomach. Biopsied.                        - Normal examined duodenum. Recommendation:        - Discharge patient to home.                        - Resume previous diet.                        - Continue present medications.                        - Await pathology results.                        - Return to referring physician as previously                         scheduled. Procedure Code(s):     --- Professional ---                        [phone], Esophagogastroduodenoscopy, flexible,                         transoral; with biopsy, single or multiple Diagnosis Code(s):     --- Professional ---                        K22.89, Other specified disease of esophagus                        K44.9, Diaphragmatic hernia without obstruction or                         gangrene  D50.9, Iron deficiency anemia, unspecified CPT copyright 2022 American Medical Association. All rights reserved. The codes documented in this report are preliminary and upon coder review may  be revised to meet current compliance requirements. Eather Colas MD, MD 11/11/2023 11:44:33 AM Number of Addenda: 0 Note Initiated On: 11/11/2023 10:58 AM Estimated Blood Loss:  Estimated blood loss was minimal.      [facility]

## 2023-11-11 NOTE — Anesthesia Preprocedure Evaluation (Signed)
Anesthesia Evaluation  Patient identified by MRN, date of birth, ID band Patient awake    Reviewed: Allergy & Precautions, NPO status , Patient's Chart, lab work & pertinent test results  Airway Mallampati: II  TM Distance: >3 FB Neck ROM: full    Dental  (+) Teeth Intact   Pulmonary neg pulmonary ROS   Pulmonary exam normal breath sounds clear to auscultation       Cardiovascular Exercise Tolerance: Good negative cardio ROS Normal cardiovascular exam Rhythm:Regular Rate:Normal     Neuro/Psych negative neurological ROS  negative psych ROS   GI/Hepatic negative GI ROS, Neg liver ROS,,,  Endo/Other  negative endocrine ROS  Class 3 obesity  Renal/GU negative Renal ROS  negative genitourinary   Musculoskeletal   Abdominal  (+) + obese  Peds negative pediatric ROS (+)  Hematology negative hematology ROS (+)   Anesthesia Other Findings Past Medical History: No date: Arthritis 2022: Breast cancer (HCC) No date: Family history of breast cancer No date: Family history of CLL (chronic lymphoid leukemia) No date: Family history of colon cancer No date: Family history of melanoma No date: Family history of pancreatic cancer 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 Excised:               04/21/2010 No date: Personal history of radiation therapy No date: Pre-diabetes No date: Wears contact lenses  Past Surgical History: 1987, 2013: BREAST BIOPSY; Right     Comment:  fibroadenoma x 2 04/06/2021: BREAST BIOPSY; Right     Comment:  u/s bx-"venus" clip-positive 1978: BREAST EXCISIONAL BIOPSY; Left No date: BREAST FIBROADENOMA SURGERY 05/08/2021: BREAST LUMPECTOMY WITH SENTINEL LYMPH NODE BIOPSY; Right     Comment:  Procedure: BREAST LUMPECTOMY WITH SENTINEL LYMPH NODE               BX;  Surgeon: Earline Mayotte, MD;  Location: [facility]               ORS;  Service:  General;  Laterality: Right; 08/28/2017: CARPAL TUNNEL RELEASE; Right     Comment:  Procedure: CARPAL TUNNEL RELEASE ENDOSCOPIC;  Surgeon:               Christena Flake, MD;  Location: [facility];                Service: Orthopedics;  Laterality: Right; 11/06/2017: CARPAL TUNNEL RELEASE; Left     Comment:  Procedure: CARPAL TUNNEL RELEASE ENDOSCOPIC;  Surgeon:               Christena Flake, MD;  Location: [facility];                Service: Orthopedics;  Laterality: Left; 09/26/2015: COLONOSCOPY; N/A     Comment:  Procedure: COLONOSCOPY;  Surgeon: Scot Jun, MD;               Location: [facility] END[facility];  Service: Endoscopy;                Laterality: N/A; 04/16/2022: COLONOSCOPY WITH PROPOFOL; N/A     Comment:  Procedure: COLONOSCOPY WITH PROPOFOL;  Surgeon:               Regis Bill, MD;  Location: [facility] ENDOSCOPY;                Service: Endoscopy;  Laterality: N/A; No date: TOOTH EXTRACTION  BMI  Body Mass Index: 32.59 kg/m      Reproductive/Obstetrics negative OB ROS                             Anesthesia Physical Anesthesia Plan  ASA: 2  Anesthesia Plan: General   Post-op Pain Management:    Induction: Intravenous  PONV Risk Score and Plan: Propofol infusion and TIVA  Airway Management Planned: Natural Airway and Nasal Cannula  Additional Equipment:   Intra-op Plan:   Post-operative Plan: Extubation in OR  Informed Consent: I have reviewed the patients History and Physical, chart, labs and discussed the procedure including the risks, benefits and alternatives for the proposed anesthesia with the patient or authorized representative who has indicated his/her understanding and acceptance.     Dental Advisory Given  Plan Discussed with: CRNA  Anesthesia Plan Comments:        Anesthesia Quick Evaluation

## 2023-11-11 NOTE — Transfer of Care (Signed)
Immediate Anesthesia Transfer of Care Note  Patient: Meghan Harper  Procedure(s) Performed: COLONOSCOPY WITH PROPOFOL ESOPHAGOGASTRODUODENOSCOPY (EGD) WITH PROPOFOL  Patient Location: Endoscopy Unit  Anesthesia Type:General  Level of Consciousness: drowsy  Airway & Oxygen Therapy: Patient Spontanous Breathing  Post-op Assessment: Report given to RN and Post -op Vital signs reviewed and stable  Post vital signs: Reviewed and stable  Last Vitals:  Vitals Value Taken Time  BP 97/50   Temp    Pulse 62   Resp 16   SpO2 96     Last Pain:  Vitals:   11/11/23 1040  TempSrc: Temporal  PainSc: 0-No pain         Complications: No notable events documented.

## 2023-11-11 NOTE — Interval H&P Note (Signed)
History and Physical Interval Note:  11/11/2023 11:03 AM  Ward Meghan Harper  has presented today for surgery, with the diagnosis of IDA.  The various methods of treatment have been discussed with the patient and family. After consideration of risks, benefits and other options for treatment, the patient has consented to  Procedure(s): COLONOSCOPY WITH PROPOFOL (N/A) ESOPHAGOGASTRODUODENOSCOPY (EGD) WITH PROPOFOL (N/A) as a surgical intervention.  The patient's history has been reviewed, patient examined, no change in status, stable for surgery.  I have reviewed the patient's chart and labs.  Questions were answered to the patient's satisfaction.     Regis Bill  Ok to proceed with EGD/Colonoscopy

## 2023-11-11 NOTE — Anesthesia Postprocedure Evaluation (Signed)
Anesthesia Post Note  Patient: Meghan Harper  Procedure(s) Performed: COLONOSCOPY WITH PROPOFOL ESOPHAGOGASTRODUODENOSCOPY (EGD) WITH PROPOFOL  Patient location during evaluation: PACU Anesthesia Type: General Level of consciousness: awake and awake and alert Pain management: pain level controlled Vital Signs Assessment: post-procedure vital signs reviewed and stable Respiratory status: spontaneous breathing Cardiovascular status: stable Anesthetic complications: no   No notable events documented.   Last Vitals:  Vitals:   11/11/23 1154 11/11/23 1204  BP: 139/69 (!) 164/84  Pulse: 88 81  Resp: 20 18  Temp:    SpO2: 99% 100%    Last Pain:  Vitals:   11/11/23 1204  TempSrc:   PainSc: 0-No pain                 VAN STAVEREN,Christon Gallaway

## 2023-11-11 NOTE — Op Note (Signed)
[facility] Patient Name: Meghan Harper Procedure Date: 11/11/2023 10:57 AM MRN: [id number] Account #: [id number] Date of Birth: 1956/10/15 Admit Type: Outpatient Age: 68 Room: [facility] ENDO ROOM 3 Gender: Female Note Status: Finalized Instrument Name: Prentice Docker [id number] Procedure:             Colonoscopy Indications:           Chronic diarrhea, Iron deficiency anemia Providers:             Eather Colas MD, MD Medicines:             Monitored Anesthesia Care Complications:         No immediate complications. Estimated blood loss:                         Minimal. Procedure:             Pre-Anesthesia Assessment:                        - Prior to the procedure, a History and Physical was                         performed, and patient medications and allergies were                         reviewed. The patient is competent. The risks and                         benefits of the procedure and the sedation options and                         risks were discussed with the patient. All questions                         were answered and informed consent was obtained.                         Patient identification and proposed procedure were                         verified by the physician, the nurse, the                         anesthesiologist, the anesthetist and the technician                         in the endoscopy suite. Mental Status Examination:                         alert and oriented. Airway Examination: normal                         oropharyngeal airway and neck mobility. Respiratory                         Examination: clear to auscultation. CV Examination:                         normal. Prophylactic Antibiotics: The patient does not  require prophylactic antibiotics. Prior                         Anticoagulants: The patient has taken no anticoagulant                         or antiplatelet agents. ASA Grade  Assessment: II - A                         patient with mild systemic disease. After reviewing                         the risks and benefits, the patient was deemed in                         satisfactory condition to undergo the procedure. The                         anesthesia plan was to use monitored anesthesia care                         (MAC). Immediately prior to administration of                         medications, the patient was re-assessed for adequacy                         to receive sedatives. The heart rate, respiratory                         rate, oxygen saturations, blood pressure, adequacy of                         pulmonary ventilation, and response to care were                         monitored throughout the procedure. The physical                         status of the patient was re-assessed after the                         procedure.                        After obtaining informed consent, the colonoscope was                         passed under direct vision. Throughout the procedure,                         the patient's blood pressure, pulse, and oxygen                         saturations were monitored continuously. The                         Colonoscope was introduced through the anus and  advanced to the the terminal ileum, with                         identification of the appendiceal orifice and IC                         valve. The colonoscopy was somewhat difficult due to a                         redundant colon. Successful completion of the                         procedure was aided by applying abdominal pressure.                         The patient tolerated the procedure well. The quality                         of the bowel preparation was good. The terminal ileum,                         ileocecal valve, appendiceal orifice, and rectum were                         photographed. Findings:      The perianal and digital  rectal examinations were normal.      The terminal ileum appeared normal.      Normal mucosa was found in the entire colon. Biopsies for histology were       taken with a cold forceps from the entire colon for evaluation of       microscopic colitis. Estimated blood loss was minimal.      Internal hemorrhoids were found during retroflexion. The hemorrhoids       were Grade I (internal hemorrhoids that do not prolapse).      The exam was otherwise without abnormality on direct and retroflexion       views. Impression:            - The examined portion of the ileum was normal.                        - Normal mucosa in the entire examined colon. Biopsied.                        - Internal hemorrhoids.                        - The examination was otherwise normal on direct and                         retroflexion views. Recommendation:        - Discharge patient to home.                        - Resume previous diet.                        - Continue present medications.                        -  Await pathology results.                        - Repeat colonoscopy in 10 years for screening                         purposes.                        - Return to referring physician as previously                         scheduled. Procedure Code(s):     --- Professional ---                        [phone], Colonoscopy, flexible; with biopsy, single or                         multiple Diagnosis Code(s):     --- Professional ---                        K64.0, First degree hemorrhoids                        K52.9, Noninfective gastroenteritis and colitis,                         unspecified                        D50.9, Iron deficiency anemia, unspecified CPT copyright 2022 American [employer]. All rights reserved. The codes documented in this report are preliminary and upon coder review may  be revised to meet current compliance requirements. Eather Colas MD, MD 11/11/2023 11:46:54  AM Number of Addenda: 0 Note Initiated On: 11/11/2023 10:57 AM Scope Withdrawal Time: 0 hours 9 minutes 55 seconds  Total Procedure Duration: 0 hours 22 minutes 19 seconds  Estimated Blood Loss:  Estimated blood loss was minimal.      [facility]

## 2023-11-11 NOTE — H&P (Signed)
Outpatient short stay form Pre-procedure 11/11/2023  Regis Bill, MD  Primary Physician: Patrice Paradise, MD  Reason for visit:  IDA/Diarrhea  History of present illness:    68 y/o lady with history of breast cancer here for EGD/Colonoscopy for IDA/Chronic diarrhea. No blood thinners. Mother with colon cancer in her 21's. No significant abdominal surgeries. Last colonoscopy in 2023 was unremarkable.    Current Facility-Administered Medications:    0.9 %  sodium chloride infusion, , Intravenous, Continuous, Audy Dauphine, Rossie Muskrat, MD, Last Rate: 20 mL/hr at 11/11/23 1054, New Bag at 11/11/23 1054  Medications Prior to Admission  Medication Sig Dispense Refill Last Dose/Taking   anastrozole (ARIMIDEX) 1 MG tablet TAKE 1 TABLET BY MOUTH DAILY 90 tablet 1 11/09/2023   Calcium Carb-Cholecalciferol (CALCIUM 600 + D PO) Take 1 tablet by mouth 2 (two) times daily.      cetirizine (ZYRTEC) 10 MG tablet Take 10 mg by mouth daily.      CO-ENZYME Q-10 PO Take 100 mg by mouth daily.      Fe Bisgly-Vit C-Vit B12-FA (GENTLE IRON PO) Take 1 capsule by mouth daily.      Glucosamine-Chondroitin (MOVE FREE PO) Take 1 tablet by mouth daily.      ibuprofen (ADVIL) 200 MG tablet Take 400 mg by mouth every 6 (six) hours as needed for mild pain.      Krill Oil 350 MG CAPS Take 350 mg by mouth daily.      levocetirizine (XYZAL) 2.5 MG/5ML solution Take 2.5 mg by mouth every evening.      magnesium chloride (SLOW-MAG) 64 MG TBEC SR tablet Take 1 tablet by mouth 2 (two) times daily.      Misc Natural Products (AIRBORNE ELDERBERRY PO) Take by mouth.      Multiple Vitamin (MULTIVITAMIN) capsule Take 1 capsule by mouth daily. Centrum Silver      Pamabrom 50 MG CAPS Take 50 mg by mouth daily as needed (water gain).      Phenylephrine-Acetaminophen (TYLENOL SINUS+HEADACHE) 5-325 MG TABS Take 1 tablet by mouth daily as needed (Headache).      Polyvinyl Alcohol-Povidone (CLEAR EYES ALL SEASONS) 5-6 MG/ML SOLN  Place 1 drop into both eyes daily as needed (Dry eyes).      Probiotic Product (PROBIOTIC-10 PO) Take 1 tablet by mouth daily.      rosuvastatin (CRESTOR) 5 MG tablet Take 5 mg by mouth daily.   11/09/2023   Turmeric (QC TUMERIC COMPLEX PO) Take by mouth.        Allergies  Allergen Reactions   Wound Dressing Adhesive Itching    Sensitive to Steri strips and Band aids adhesive   Meloxicam Itching     Past Medical History:  Diagnosis Date   Arthritis    Breast cancer (HCC) 2022   Family history of breast cancer    Family history of CLL (chronic lymphoid leukemia)    Family history of colon cancer    Family history of melanoma    Family history of pancreatic cancer    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 Excised: 04/21/2010   Personal history of radiation therapy    Pre-diabetes    Wears contact lenses     Review of systems:  Otherwise negative.    Physical Exam  Gen: Alert, oriented. Appears stated age.  HEENT: PERRLA. Lungs: No respiratory distress CV: RRR Abd: soft, benign, no masses Ext: No edema  Planned procedures: Proceed with EGD/colonoscopy. The patient understands the nature of the planned procedure, indications, risks, alternatives and potential complications including but not limited to bleeding, infection, perforation, damage to internal organs and possible oversedation/side effects from anesthesia. The patient agrees and gives consent to proceed.  Please refer to procedure notes for findings, recommendations and patient disposition/instructions.     Regis Bill, MD [facility]

## 2023-11-12 ENCOUNTER — Encounter: Payer: Self-pay | Admitting: Gastroenterology

## 2023-11-15 LAB — SURGICAL PATHOLOGY

## 2023-12-17 ENCOUNTER — Inpatient Hospital Stay (HOSPITAL_BASED_OUTPATIENT_CLINIC_OR_DEPARTMENT_OTHER): Payer: Medicare Other | Admitting: Internal Medicine

## 2023-12-17 ENCOUNTER — Inpatient Hospital Stay: Payer: Medicare Other | Attending: Internal Medicine

## 2023-12-17 ENCOUNTER — Inpatient Hospital Stay: Payer: Medicare Other

## 2023-12-17 ENCOUNTER — Encounter: Payer: Self-pay | Admitting: Internal Medicine

## 2023-12-17 VITALS — BP 128/79 | HR 87 | Temp 97.3°F | Resp 19 | Wt 187.0 lb

## 2023-12-17 DIAGNOSIS — Z170 Estrogen receptor positive status [ER+]: Secondary | ICD-10-CM | POA: Diagnosis not present

## 2023-12-17 DIAGNOSIS — N951 Menopausal and female climacteric states: Secondary | ICD-10-CM | POA: Diagnosis not present

## 2023-12-17 DIAGNOSIS — C50211 Malignant neoplasm of upper-inner quadrant of right female breast: Secondary | ICD-10-CM | POA: Diagnosis present

## 2023-12-17 DIAGNOSIS — D509 Iron deficiency anemia, unspecified: Secondary | ICD-10-CM

## 2023-12-17 DIAGNOSIS — Z79811 Long term (current) use of aromatase inhibitors: Secondary | ICD-10-CM | POA: Insufficient documentation

## 2023-12-17 DIAGNOSIS — D649 Anemia, unspecified: Secondary | ICD-10-CM | POA: Insufficient documentation

## 2023-12-17 DIAGNOSIS — R9389 Abnormal findings on diagnostic imaging of other specified body structures: Secondary | ICD-10-CM | POA: Diagnosis not present

## 2023-12-17 LAB — CBC WITH DIFFERENTIAL (CANCER CENTER ONLY)
Abs Immature Granulocytes: 0.01 10*3/uL (ref 0.00–0.07)
Basophils Absolute: 0.1 10*3/uL (ref 0.0–0.1)
Basophils Relative: 1 %
Eosinophils Absolute: 0.1 10*3/uL (ref 0.0–0.5)
Eosinophils Relative: 2 %
HCT: 41 % (ref 36.0–46.0)
Hemoglobin: 13.8 g/dL (ref 12.0–15.0)
Immature Granulocytes: 0 %
Lymphocytes Relative: 27 %
Lymphs Abs: 1.8 10*3/uL (ref 0.7–4.0)
MCH: 29.4 pg (ref 26.0–34.0)
MCHC: 33.7 g/dL (ref 30.0–36.0)
MCV: 87.4 fL (ref 80.0–100.0)
Monocytes Absolute: 0.5 10*3/uL (ref 0.1–1.0)
Monocytes Relative: 7 %
Neutro Abs: 4.2 10*3/uL (ref 1.7–7.7)
Neutrophils Relative %: 63 %
Platelet Count: 273 10*3/uL (ref 150–400)
RBC: 4.69 MIL/uL (ref 3.87–5.11)
RDW: 12 % (ref 11.5–15.5)
WBC Count: 6.7 10*3/uL (ref 4.0–10.5)
nRBC: 0 % (ref 0.0–0.2)

## 2023-12-17 LAB — CMP (CANCER CENTER ONLY)
ALT: 28 U/L (ref 0–44)
AST: 25 U/L (ref 15–41)
Albumin: 4.1 g/dL (ref 3.5–5.0)
Alkaline Phosphatase: 48 U/L (ref 38–126)
Anion gap: 8 (ref 5–15)
BUN: 18 mg/dL (ref 8–23)
CO2: 26 mmol/L (ref 22–32)
Calcium: 9 mg/dL (ref 8.9–10.3)
Chloride: 103 mmol/L (ref 98–111)
Creatinine: 0.53 mg/dL (ref 0.44–1.00)
GFR, Estimated: >60 mL/min (ref >60)
Glucose, Bld: 113 mg/dL — ABNORMAL HIGH (ref 70–99)
Potassium: 4.4 mmol/L (ref 3.5–5.1)
Sodium: 137 mmol/L (ref 135–145)
Total Bilirubin: 0.5 mg/dL (ref 0.0–1.2)
Total Protein: 6.8 g/dL (ref 6.5–8.1)

## 2023-12-17 LAB — SAMPLE TO BLOOD BANK

## 2023-12-17 LAB — IRON AND TIBC
Iron: 92 ug/dL (ref 28–170)
Saturation Ratios: 24 % (ref 10.4–31.8)
TIBC: 378 ug/dL (ref 250–450)
UIBC: 286 ug/dL

## 2023-12-17 LAB — FERRITIN: Ferritin: 25 ng/mL (ref 11–307)

## 2023-12-17 NOTE — Progress Notes (Signed)
 Patient states she did an endoscopy and colonoscopy which came back normal  Patient states in November she had a lite bleeding a couple days and lite cramping. On February 4th dr.beasley tried to a do a pelvic ultrasound and a biopsy but was unsuccessful. She wants to know if the bleeding will start the anemia again or is it uterine cancer. She just wants to rule out any possibilities and would like MD input on this.   Patient denies any bleeding or cramping after that time in November

## 2023-12-17 NOTE — Assessment & Plan Note (Addendum)
#  Stage I T1c- ER positive; PR negative; her 2 negative; s/p lumpectomy clear margins [Dr,Byrnett/ Dr.Sam ]. ONCOTYPE-LOW RISK- s/p RT. June 2022. MAy 2024 [Dr.Cintron]- Bil Mamm-WNL; stable.   # ON anastrazole- tolerating well except for hot flashes- see below.  # Hot flashes grade 1-recommend monitor for now; stable.   # severe anemia- hb- 6.0 [OCT, 2024]- sec to severe  iron studies;ferritin- colonoscopy- negative-EGD- Hiatal hernia- ? etiology. Discussed with [Dr.Locklear]; okay to hold off capsule study given the ongoing workup for endometrial thickening.  Today hemoglobin is 13.2.  Hold off Venofer.  # Endometrial thickening [Dr.Beasley]-Difficult to Biopsy.  I have reached out to Dr. Dalbert Garnet regarding plan of care.  Also discussed with Adele Schilder.   # BMD- OCT 2024- [Dr.Moriyati; [facility]]-The BMD measured at Femur Neck Right is 0.940 g/cm2 with a T-score of -0.7.  Continue Calcium plus vitamin D; weightbearing exercises.   stable  # DISPOSITION: # HOLD venofer today # Follow up in 4  months  MD; cbc/cmp; iron studies; ferritin- possible venofer-- -Dr.B

## 2023-12-17 NOTE — Progress Notes (Signed)
 [facility] CONSULT NOTE  Patient Care Team: Patrice Paradise, MD as PCP - General (Physician Assistant) Scarlett Presto, RN (Inactive) as Oncology Nurse Navigator Carmina Miller, MD as Consulting Physician (Radiation Oncology) Earna Coder, MD as Consulting Physician (Internal Medicine) Earline Mayotte, MD as Consulting Physician (General Surgery)  CHIEF COMPLAINTS/PURPOSE OF CONSULTATION: Breast cancer  Oncology History Overview Note  # June 2022- Targeted ultrasound was performed of the RIGHT upper inner breast. At 1:30 8 cm from the nipple, there is an irregular hypoechoic mass with indistinct margins. It measures approximately 10 x 5 x 6 mm. This corresponds to the site of screening mammographic concern.   Targeted ultrasound was performed of the RIGHT axilla. No suspicious axillary lymph nodes are seen.   IMPRESSION: 1. There is a 10 mm mass in the RIGHT upper inner breast which is concerning for malignancy. Recommend ultrasound-guided biopsy for definitive characterization.  DIAGNOSIS:  A. BREAST, RIGHT, 1:30 O'CLOCK 8 CM FROM NIPPLE; ULTRASOUND-GUIDED CORE  BIOPSY:  - INVASIVE MAMMARY CARCINOMA, NO SPECIAL TYPE.   Size of invasive carcinoma: 7 mm in this sample  Histologic grade of invasive carcinoma: Grade 2                       Glandular/tubular differentiation score: 3                       Nuclear pleomorphism score: 2                       Mitotic rate score: 1                       Total score: 6  Ductal carcinoma in situ: Present, intermediate grade  Lymphovascular invasion: Not identified   CASE SUMMARY: BREAST BIOMARKER TESTS  Estrogen Receptor (ER) Status: POSITIVE          Percentage of cells with nuclear positivity: Greater than 90%          Average intensity of staining: Strong   Progesterone Receptor (PgR) Status: NEGATIVE (less than 1%)          Internal control cells present and stain as expected   HER2 (by  immunohistochemistry): NEGATIVE (Score 0)  TNM Descriptors: Not applicable  pT1c  Regional Lymph Nodes Modifier: Not applicable  pN0  pM - Not applicable  #  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   #Right breast cancer status postlumpectomy-Oncotype low risk; no chemotherapy.  # NOV 14th, 2022-start anastrozole for 5 years.   Carcinoma of upper-inner quadrant of right breast in female, estrogen receptor positive (HCC)  04/18/2021 Initial Diagnosis   Carcinoma of upper-inner quadrant of right breast in female, estrogen receptor positive (HCC)    Genetic Testing   Negative genetic testing. No pathogenic variants identified on the [employer] CancerNext-Expanded+RNA Panel. The report date is 04/30/2021.   The CancerNext-Expanded + RNAinsight gene panel offered by [employer] and includes sequencing and rearrangement analysis for the following 77 genes: IP, ALK, APC*, ATM*, AXIN2, BAP1, BARD1, BLM, BMPR1A, BRCA1*, BRCA2*, BRIP1*, CDC73, CDH1*,CDK4, CDKN1B, CDKN2A, CHEK2*, CTNNA1, DICER1, FANCC, FH, FLCN, GALNT12, KIF1B, LZTR1, MAX, MEN1, MET, MLH1*, MSH2*, MSH3, MSH6*, MUTYH*, NBN, NF1*, NF2, NTHL1, PALB2*, PHOX2B, PMS2*, POT1, PRKAR1A, PTCH1, PTEN*, RAD51C*, RAD51D*,RB1, RECQL, RET, SDHA, SDHAF2, SDHB, SDHC, SDHD, SMAD4, SMARCA4, SMARCB1, SMARCE1, STK11, SUFU, TMEM127, TP53*,TSC1, TSC2, VHL and XRCC2 (sequencing and deletion/duplication); EGFR, EGLN1,  HOXB13, KIT, MITF, PDGFRA, POLD1 and POLE (sequencing only); EPCAM and GREM1 (deletion/duplication only).   05/25/2021 Cancer Staging   Staging form: Breast, AJCC 8th Edition - Pathologic: Stage IA (pT1c, pN0, cM0, G2, ER+, PR-, HER2-) - Signed by Earna Coder, MD on 05/25/2021 Histologic grading system: 3 grade system     HISTORY OF PRESENTING ILLNESS: Ambulating independently.  Alone.  Meghan Harper 68 y.o.  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  Patient states she did an endoscopy and colonoscopy.    Patient states in November she had a lite bleeding a couple days and lite cramping. On February 4th dr.beasley tried to a do a pelvic ultrasound and a biopsy but was unsuccessful.    Patient denies any bleeding or cramping after that time in November.  Patient is very concerned about her endometrial thickening.  Denies any blood in stools or black-colored stool.  Patient denies any worsening hot flashes or joint pains.  Denies any nausea vomiting.   Review of Systems  Constitutional:  Positive for malaise/fatigue. Negative for chills, diaphoresis and fever.  HENT:  Negative for nosebleeds and sore throat.   Eyes:  Negative for double vision.  Respiratory:  Positive for shortness of breath. Negative for hemoptysis, sputum production and wheezing.   Cardiovascular:  Negative for chest pain, palpitations, orthopnea and leg swelling.  Gastrointestinal:  Negative for blood in stool, constipation, diarrhea, heartburn, melena and vomiting.  Genitourinary:  Negative for dysuria, frequency and urgency.  Musculoskeletal:  Positive for joint pain. Negative for back pain.  Skin: Negative.  Negative for itching and rash.  Neurological:  Negative for dizziness, tingling, focal weakness, weakness and headaches.  Endo/Heme/Allergies:  Does not bruise/bleed easily.  Psychiatric/Behavioral:  Negative for depression. The patient is not nervous/anxious and does not have insomnia.      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 Excised: 04/21/2010   Personal history of radiation  therapy    Pre-diabetes    Wears contact lenses     SURGICAL HISTORY: Past Surgical History:  Procedure Laterality Date   BIOPSY  11/11/2023   Procedure: BIOPSY;  Surgeon: Regis Bill, MD;  Location: [facility] ENDOSCOPY;  Service: Endoscopy;;   BREAST BIOPSY Right 1987, 2013   fibroadenoma x 2   BREAST BIOPSY Right 04/06/2021   u/s bx-"venus" clip-positive   BREAST EXCISIONAL BIOPSY Left 1978   BREAST FIBROADENOMA SURGERY     BREAST LUMPECTOMY WITH SENTINEL LYMPH NODE BIOPSY Right 05/08/2021   Procedure: BREAST LUMPECTOMY WITH SENTINEL LYMPH NODE BX;  Surgeon: Earline Mayotte, MD;  Location: [facility] ORS;  Service: General;  Laterality: Right;   CARPAL TUNNEL RELEASE Right 08/28/2017   Procedure: CARPAL TUNNEL RELEASE ENDOSCOPIC;  Surgeon: Christena Flake, MD;  Location: [facility];  Service: Orthopedics;  Laterality: Right;   CARPAL TUNNEL RELEASE Left 11/06/2017   Procedure: CARPAL TUNNEL RELEASE ENDOSCOPIC;  Surgeon: Christena Flake, MD;  Location: [facility];  Service: Orthopedics;  Laterality: Left;   COLONOSCOPY N/A 09/26/2015   Procedure: COLONOSCOPY;  Surgeon: Scot Jun, MD;  Location: [facility]COPY;  Service: Endoscopy;  Laterality: N/A;   COLONOSCOPY WITH PROPOFOL N/A 04/16/2022   Procedure: COLONOSCOPY WITH PROPOFOL;  Surgeon: Regis Bill, MD;  Location: [facility] ENDOSCOPY;  Service: Endoscopy;  Laterality: N/A;   COLONOSCOPY WITH PROPOFOL N/A 11/11/2023   Procedure: COLONOSCOPY WITH PROPOFOL;  Surgeon: Regis Bill, MD;  Location: [facility] ENDOSCOPY;  Service: Endoscopy;  Laterality: N/A;   ESOPHAGOGASTRODUODENOSCOPY (EGD) WITH PROPOFOL N/A 11/11/2023   Procedure: ESOPHAGOGASTRODUODENOSCOPY (EGD) WITH PROPOFOL;  Surgeon: Regis Bill, MD;  Location: [facility] ENDOSCOPY;  Service: Endoscopy;  Laterality: N/A;   TOOTH EXTRACTION      SOCIAL HISTORY: Social History   Socioeconomic History   Marital status: Single    Spouse name: Not on file    Number of children: Not on file   Years of education: Not on file   Highest education level: Not on file  Occupational History   Not on file  Tobacco Use   Smoking status: Never   Smokeless tobacco: Never  Vaping Use   Vaping status: Never Used  Substance and Sexual Activity   Alcohol use: Yes    Alcohol/week: 0.0 standard drinks of alcohol    Comment: may have a drink 3-4x/yr   Drug use: No   Sexual activity: Not on file  Other Topics Concern   Not on file  Social History Narrative   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 No smoking; lives in [place]; works in [place].    Social Drivers of Corporate investment banker Strain: Low Risk  (10/10/2023)   Received from [facility]   Overall Financial Resource Strain (CARDIA)    Difficulty of Paying Living Expenses: Not hard at all  Food Insecurity: No Food Insecurity (10/10/2023)   Received from [facility]   Hunger Vital Sign    Worried About Running Out of Food in the Last Year: Never true    Ran Out of Food in the Last Year: Never true  Transportation Needs: No Transportation Needs (10/10/2023)   Received from [facility] - Transportation    In the past 12 months, has lack of transportation kept you from medical appointments or from getting medications?: No    Lack of Transportation (Non-Medical): No  Physical Activity: Not on file  Stress: Not on file  Social Connections: Not on file  Intimate Partner Violence: Not on file    FAMILY HISTORY: Family History  Problem Relation Age of Onset   Colon cancer Mother        dx 76s   Other Brother        CLL and skin cancers   Breast cancer Maternal Aunt 72   Lung cancer Maternal Aunt    Breast cancer Paternal Aunt 88   Pancreatic cancer Paternal Aunt    Melanoma Maternal Uncle    Breast cancer Niece        dx 30s, "BRCA pos"    ALLERGIES:  is allergic to wound dressing adhesive and  meloxicam.  MEDICATIONS:  Current Outpatient Medications  Medication Sig Dispense Refill   anastrozole (ARIMIDEX) 1 MG tablet TAKE 1 TABLET BY MOUTH DAILY 90 tablet 1   Calcium Carb-Cholecalciferol (CALCIUM 600 + D PO) Take 1 tablet by mouth 2 (two) times daily.     cetirizine (ZYRTEC) 10 MG  tablet Take 10 mg by mouth daily.     CO-ENZYME Q-10 PO Take 100 mg by mouth daily.     Fe Bisgly-Vit C-Vit B12-FA (GENTLE IRON PO) Take 1 capsule by mouth daily.     Glucosamine-Chondroitin (MOVE FREE PO) Take 1 tablet by mouth daily.     ibuprofen (ADVIL) 200 MG tablet Take 400 mg by mouth every 6 (six) hours as needed for mild pain.     Krill Oil 350 MG CAPS Take 350 mg by mouth daily.     levocetirizine (XYZAL) 2.5 MG/5ML solution Take 2.5 mg by mouth every evening.     magnesium chloride (SLOW-MAG) 64 MG TBEC SR tablet Take 1 tablet by mouth 2 (two) times daily.     Misc Natural Products (AIRBORNE ELDERBERRY PO) Take by mouth.     Multiple Vitamin (MULTIVITAMIN) capsule Take 1 capsule by mouth daily. Centrum Silver     Pamabrom 50 MG CAPS Take 50 mg by mouth daily as needed (water gain).     Phenylephrine-Acetaminophen (TYLENOL SINUS+HEADACHE) 5-325 MG TABS Take 1 tablet by mouth daily as needed (Headache).     Polyvinyl Alcohol-Povidone (CLEAR EYES ALL SEASONS) 5-6 MG/ML SOLN Place 1 drop into both eyes daily as needed (Dry eyes).     Probiotic Product (PROBIOTIC-10 PO) Take 1 tablet by mouth daily.     rosuvastatin (CRESTOR) 5 MG tablet Take 5 mg by mouth daily.     Turmeric (QC TUMERIC COMPLEX PO) Take by mouth.     No current facility-administered medications for this visit.      Marland Kitchen  PHYSICAL EXAMINATION: ECOG PERFORMANCE STATUS: 0 - Asymptomatic  Vitals:   12/17/23 1427  BP: 128/79  Pulse: 87  Resp: 19  Temp: (!) 97.3 F (36.3 C)  SpO2: 96%     Filed Weights   12/17/23 1427  Weight: 187 lb (84.8 kg)      Physical Exam Vitals and nursing note reviewed.  HENT:      Head: Normocephalic and atraumatic.     Mouth/Throat:     Pharynx: Oropharynx is clear.  Eyes:     Extraocular Movements: Extraocular movements intact.     Pupils: Pupils are equal, round, and reactive to light.  Cardiovascular:     Rate and Rhythm: Normal rate and regular rhythm.  Pulmonary:     Comments: Decreased breath sounds bilaterally.  Abdominal:     Palpations: Abdomen is soft.  Musculoskeletal:        General: Normal range of motion.     Cervical back: Normal range of motion.  Skin:    General: Skin is warm.  Neurological:     General: No focal deficit present.     Mental Status: She is alert and oriented to person, place, and time.  Psychiatric:        Behavior: Behavior normal.        Judgment: Judgment normal.      LABORATORY DATA:  I have reviewed the data as listed Lab Results  Component Value Date   WBC 6.7 12/17/2023   HGB 13.8 12/17/2023   HCT 41.0 12/17/2023   MCV 87.4 12/17/2023   PLT 273 12/17/2023   Recent Labs    08/12/23 1524 09/16/23 1243 12/17/23 1410  NA 134* 141 137  K 4.1 4.2 4.4  CL 101 103 103  CO2 25 26 26   GLUCOSE 108* 122* 113*  BUN 23 18 18   CREATININE 0.55 0.53 0.53  CALCIUM 9.2 9.3 9.0  GFRNONAA >60 >60 >60  PROT 7.0 6.8 6.8  ALBUMIN 3.9 4.2 4.1  AST 19 27 25   ALT 17 23 28   ALKPHOS 50 47 48  BILITOT 0.4 0.4 0.5     RADIOGRAPHIC STUDIES: I have personally reviewed the radiological images as listed and agreed with the findings in the report. No results found.  ASSESSMENT & PLAN:   Carcinoma of upper-inner quadrant of right breast in female, estrogen receptor positive (HCC) #Stage I T1c- ER positive; PR negative; her 2 negative; s/p lumpectomy clear margins [Dr,Byrnett/ Dr.Sam ]. ONCOTYPE-LOW RISK- s/p RT. June 2022. MAy 2024 [Dr.Cintron]- Bil Mamm-WNL; stable.   # ON anastrazole- tolerating well except for hot flashes- see below.  # Hot flashes grade 1-recommend monitor for now; stable.   # severe anemia- hb-  6.0 [OCT, 2024]- sec to severe  iron studies;ferritin- colonoscopy- negative-EGD- Hiatal hernia- ? etiology. Discussed with [Dr.Locklear]; okay to hold off capsule study given the ongoing workup for endometrial thickening.  Today hemoglobin is 13.2.  Hold off Venofer.  # Endometrial thickening [Dr.Beasley]-Difficult to Biopsy.  I have reached out to Dr. Dalbert Garnet regarding plan of care.  Also discussed with Adele Schilder.   # BMD- OCT 2024- [Dr.Moriyati; [facility]]-The BMD measured at Femur Neck Right is 0.940 g/cm2 with a T-score of -0.7.  Continue Calcium plus vitamin D; weightbearing exercises.   stable  # DISPOSITION: # HOLD venofer today # Follow up in 4  months  MD; cbc/cmp; iron studies; ferritin- possible venofer-- -Dr.B   All questions were answered. The patient/family knows to call the clinic with any problems, questions or concerns.    Earna Coder, MD 12/17/2023 3:50 PM

## 2024-01-14 ENCOUNTER — Other Ambulatory Visit: Payer: Self-pay | Admitting: General Surgery

## 2024-01-14 DIAGNOSIS — Z853 Personal history of malignant neoplasm of breast: Secondary | ICD-10-CM

## 2024-01-21 ENCOUNTER — Other Ambulatory Visit: Payer: Self-pay | Admitting: Internal Medicine

## 2024-01-24 NOTE — H&P (Signed)
 Chief Complaint:  Meghan Harper is a 68 y.o. female here for Pre Op Consulting (Sign consents) .  History of Present Illness: Patient presents for a preoperative visit to schedule a D&C, hysteroscopy, and possible polypectomy.  She has a hx of: PMB  Workup has included: SIS  Visualized. Size 46 mm x 36 mm x 40 mm Normal Position: anteflexed Malformations: none Myometrium: appears normal Endometrium: Thickened. Endometrial thickness, total 18.9 mm Cervix details: nabothian cysts visualized No fibroids identified No polyps identified The endometrium appears thickened and is measuring 18-20 mm.  Unable to dilate cervix, no embx  Past Medical History:  has a past medical history of Abnormal cytology (06/1995), Allergy (many years), Arthritis (02/2016), History of abnormal cervical Pap smear (06/1995), History of blood transfusion (08/12/2023), History of radiation therapy (2022), Hyperlipidemia (2007), Malignant neoplasm of upper-inner quadrant of right breast in female, estrogen receptor positive (CMS/HHS-HCC) (05/08/2021), Melanoma (CMS/HHS-HCC) (2011), and Moderate single current episode of major depressive disorder (CMS/HHS-HCC).  Past Surgical History:  has a past surgical history that includes Colonoscopy (N/A, 12/26/2001); Colonoscopy (N/A, 08/04/2010, 08/17/2005); Colonoscopy (N/A, 09/26/2015); endoscopic right carpal tunnel release  (Right, 08/28/2017); Endoscopic left carpal tunnel release  (Left, 11/06/2017); Tooth extraction (Right, 07/12/2018); Breast excisional biopsy (04/06/2021); Breast excisional biopsy (Left, 1978); Breast excisional biopsy (Right, 1987); Breast excisional biopsy (Right, 2013); Mastectomy partial / lumpectomy (Right, 05/08/2021); Colon @ [facility] (04/16/2022); Colon @ [facility] (11/11/2023); EGD @ [facility] (11/11/2023); and Gynecologic cryosurgery (1996). Family History: family history includes Allergic rhinitis in her mother; Alzheimer's disease in her father and mother;  Breast cancer (age of onset: 26) in her maternal aunt and paternal aunt; Colon cancer in her mother; Heart failure in her father; Hyperlipidemia (Elevated cholesterol) in her mother; Melanoma in her maternal uncle; Myocardial Infarction (Heart attack) in her father; Osteoporosis (Thinning of bones) in her mother; Pancreatic cancer in her paternal aunt; Skin cancer in her brother and maternal uncle; Stroke in her maternal aunt. Social History:  reports that she has never smoked. She has never used smokeless tobacco. She reports current alcohol use. She reports that she does not use drugs. OB/GYN History:  OB History    Gravida 0  Para 0  Term 0  Preterm 0  AB 0  Living 0    SAB 0  IAB 0  Ectopic 0  Molar 0  Multiple 0  Live Births 0     Obstetric Comments Age at first period 24        Allergies: is allergic to adhesive and mobic [meloxicam]. Medications:  Current Outpatient Medications:    anastrozole (ARIMIDEX) 1 mg tablet, Take 1 tablet (1 mg total) by mouth once daily, Disp: , Rfl:    calcium carb/vit D3/minerals (CALTRATE 600+D PLUS MINERALS ORAL), Take by mouth once daily, Disp: , Rfl:    cyanocobalamin-cobamamide 5,000-100 mcg Subl, , Disp: , Rfl:    docusate sodium (STOOL SOFTENER ORAL), Take by mouth, Disp: , Rfl:    ferrous sulfate 325 (65 FE) MG tablet, Take 325 mg by mouth daily with breakfast, Disp: , Rfl:    fexofenadine (ALLEGRA) 60 MG tablet, Take 60 mg by mouth once daily as needed, Disp: , Rfl:    folic acid/multivit-min/lutein (CENTRUM SILVER ORAL), Take by mouth once daily, Disp: , Rfl:    glucosam/chond/hyalu/CF borate (MOVE FREE JOINT HEALTH ORAL), Take by mouth once daily, Disp: , Rfl:    krill-omega-3-dha-epa-lipids 350-90-24-50 mg Cap, Take 1 capsule by mouth once daily Advanced Omega-3, Disp: , Rfl:  magnesium chloride (SLOW-MAG ORAL), Take 2 tablets by mouth once daily, Disp: , Rfl:    multivit-min/vit C/herb no.124  (AIRBORNE GUMMY ORAL), Take by mouth, Disp: , Rfl:    multivitamin with minerals (HAIR,SKIN AND NAILS ORAL), Take by mouth, Disp: , Rfl:    rosuvastatin (CRESTOR) 5 MG tablet, TAKE ONE TABLET BY MOUTH DAILY, Disp: 90 tablet, Rfl: 1   TURMERIC ORAL, Take 1,000 mcg by mouth once daily, Disp: , Rfl:    ubidecarenone/vitamin E mixed (COQ10 SG 100 ORAL), Take 100 mg by mouth once daily, Disp: , Rfl:    cetirizine (ZYRTEC) 10 MG tablet, Take 10 mg by mouth once daily (Patient not taking: Reported on 01/24/2024), Disp: , Rfl:    cranberry extract/vitamin C (AZO CRANBERRY PLUS VIT C ORAL), , Disp: , Rfl:    ipratropium (ATROVENT) 0.06 % nasal spray, 1 spray as needed (Patient not taking: Reported on 08/06/2023), Disp: , Rfl:    Lactobac no.41/Bifidobact no.7 (PROBIOTIC-10 ORAL), Take by mouth once daily (Patient not taking: Reported on 08/06/2023), Disp: , Rfl:    levocetirizine (XYZAL) 5 MG tablet, Take 5 mg by mouth every evening (Patient not taking: Reported on 08/06/2023), Disp: , Rfl:    pamabrom 50 mg Cap, Take 1 capsule by mouth once daily as needed (Patient not taking: Reported on 09/09/2023), Disp: , Rfl:    UNABLE TO FIND, Collagen, VitD3 supplement (Patient not taking: Reported on 08/06/2023), Disp: , Rfl:   Review of Systems: No SOB, no palpitations or chest pain, no new lower extremity edema, no nausea or vomiting or bowel or bladder complaints. See HPI for gyn specific ROS.   Exam:  BP 118/74   Pulse 91   Ht 161.3 cm (5' 3.5")   Wt 83.9 kg (185 lb)   BMI 32.26 kg/m   General: Patient is well-groomed, well-nourished, appears stated age in no acute distress   HEENT: head is atraumatic and normocephalic, trachea is midline, neck is supple with no palpable nodules   CV: Regular rhythm and normal heart rate, no murmur   Pulm: Clear to auscultation throughout lung fields with no wheezing, crackles, or rhonchi. No increased work of breathing  Abdomen: soft , no mass, non-tender, no  rebound tenderness, no hepatomegaly  Pelvic: tanner stage 5 ,   External genitalia: vulva /labia no lesions  Urethra: no prolapse  Vagina: normal physiologic d/c, laxity in vaginal walls  Cervix: no lesions, no cervical motion tenderness, good descent  Uterus: normal size shape and contour, non-tender  Adnexa: no mass,  non-tender    Rectovaginal: External wnl  A female chaperone was present for the more sensitive portions of the physical exam ( such as breast and pelvic)  Impression:  The primary encounter diagnosis was PMB (postmenopausal bleeding). Diagnoses of Thickened endometrium and Cervical stenosis (uterine cervix) were also pertinent to this visit.  Plan:  1.  Preoperative visit: D&C hysteroscopy, polypectomy. Consents signed today.  -Risks of surgery were discussed with the patient including but not limited to: bleeding which may require transfusion; infection which may require antibiotics; injury to uterus or surrounding organs; intrauterine scarring which may impair future fertility; need for additional procedures including laparotomy or laparoscopy; and other postoperative/anesthesia complications. Written informed consent was obtained.  This is a scheduled same-day surgery. She will have a postop visit in 2 weeks to review operative findings and pathology.  Return for Postop check.  Cecilie Kicks, MD

## 2024-02-04 ENCOUNTER — Encounter
Admission: RE | Admit: 2024-02-04 | Discharge: 2024-02-04 | Disposition: A | Discharge status: Home / Self Care | Source: Ambulatory Visit | Attending: Obstetrics and Gynecology | Admitting: Obstetrics and Gynecology

## 2024-02-04 ENCOUNTER — Other Ambulatory Visit: Payer: Self-pay

## 2024-02-04 VITALS — Ht 63.5 in | Wt 184.0 lb

## 2024-02-04 DIAGNOSIS — Z01812 Encounter for preprocedural laboratory examination: Secondary | ICD-10-CM

## 2024-02-04 DIAGNOSIS — E7800 Pure hypercholesterolemia, unspecified: Secondary | ICD-10-CM

## 2024-02-04 DIAGNOSIS — R7303 Prediabetes: Secondary | ICD-10-CM

## 2024-02-04 HISTORY — DX: Postmenopausal bleeding: N95.0

## 2024-02-04 HISTORY — DX: Other spondylosis with radiculopathy, cervical region: M47.22

## 2024-02-04 HISTORY — DX: Pure hypercholesterolemia, unspecified: E78.00

## 2024-02-04 HISTORY — DX: Irritable bowel syndrome with diarrhea: K58.0

## 2024-02-04 HISTORY — DX: Carpal tunnel syndrome, bilateral upper limbs: G56.03

## 2024-02-04 HISTORY — DX: Iron deficiency anemia, unspecified: D50.9

## 2024-02-04 HISTORY — DX: Cardiac murmur, unspecified: R01.1

## 2024-02-04 HISTORY — DX: Personal history of other diseases of the digestive system: Z87.19

## 2024-02-04 NOTE — Patient Instructions (Addendum)
 Your procedure is scheduled on: Thursday April 24th, 2025 Report to the Registration Desk on the 1st floor of the [facility] To find out your arrival time, please call [phone] between 1PM - 3PM on: Wednesday April 23 If your arrival time is 6:00 am, do not arrive before that time as the [facility] entrance doors do not open until 6:00 am.  REMEMBER: Instructions that are not followed completely may result in serious medical risk, up to and including death; or upon the discretion of your surgeon and anesthesiologist your surgery may need to be rescheduled.  Do not eat food after midnight the night before surgery.  No gum chewing or hard candies.  You may however, drink CLEAR liquids up to 2 hours before you are scheduled to arrive for your surgery. Do not drink anything within 2 hours of your scheduled arrival time.  Clear liquids include: - water  - apple juice without pulp - gatorade (not RED colors) - black coffee or tea (Do NOT add milk or creamers to the coffee or tea) Do NOT drink anything that is not on this list.  One week prior to surgery: starting April 17 Stop Anti-inflammatories (NSAIDS) such as Advil, Aleve, Ibuprofen, Motrin, Naproxen, Naprosyn and Aspirin based products such as Excedrin, Goody's Powder, BC Powder. Stop ANY OVER THE COUNTER supplements until after surgery.  You may however, continue to take Tylenol if needed for pain up until the day of surgery.  Continue taking all of your other prescription medications up until the day of surgery.  ON THE DAY OF SURGERY DO NOT TAKE ANY MEDICATIONS   No Alcohol for 24 hours before or after surgery.  No Smoking including e-cigarettes for 24 hours before surgery.  No chewable tobacco products for at least 6 hours before surgery.  No nicotine patches on the day of surgery.  Do not use any "recreational" drugs for at least a week (preferably 2 weeks) before your surgery.  Please be advised that the  combination of cocaine and anesthesia may have negative outcomes, up to and including death. If you test positive for cocaine, your surgery will be cancelled.  On the morning of surgery brush your teeth with toothpaste and water, you may rinse your mouth with mouthwash if you wish. Do not swallow any toothpaste or mouthwash.  Do not wear jewelry, make-up, hairpins, clips or nail polish.  For welded (permanent) jewelry: bracelets, anklets, waist bands, etc.  Please have this removed prior to surgery.  If it is not removed, there is a chance that hospital personnel will need to cut it off on the day of surgery.  Do not wear lotions, powders, or perfumes.   Do not shave body hair from the neck down 48 hours before surgery.  Contact lenses, hearing aids and dentures may not be worn into surgery.  Do not bring valuables to the hospital. [facility] is not responsible for any missing/lost belongings or valuables.   Notify your doctor if there is any change in your medical condition (cold, fever, infection).  Wear comfortable clothing (specific to your surgery type) to the hospital.  After surgery, you can help prevent lung complications by doing breathing exercises.  Take deep breaths and cough every 1-2 hours. Your doctor may order a device called an Incentive Spirometer to help you take deep breaths.  If you are being discharged the day of surgery, you will not be allowed to drive home. You will need a responsible individual to drive  you home and stay with you for 24 hours after surgery.   If you are taking public transportation, you will need to have a responsible individual with you.  Please call the Pre-admissions Testing Dept. at [phone] if you have any questions about these instructions.  Surgery Visitation Policy:  Patients having surgery or a procedure may have two visitors.  Children under the age of 58 must have an adult with them who is not the patient.

## 2024-02-05 ENCOUNTER — Encounter
Admission: RE | Admit: 2024-02-05 | Discharge: 2024-02-05 | Disposition: A | Discharge status: Home / Self Care | Source: Ambulatory Visit | Attending: Obstetrics and Gynecology | Admitting: Obstetrics and Gynecology

## 2024-02-05 DIAGNOSIS — Z01810 Encounter for preprocedural cardiovascular examination: Secondary | ICD-10-CM | POA: Diagnosis present

## 2024-02-05 DIAGNOSIS — R7303 Prediabetes: Secondary | ICD-10-CM | POA: Insufficient documentation

## 2024-02-05 DIAGNOSIS — N950 Postmenopausal bleeding: Secondary | ICD-10-CM | POA: Diagnosis not present

## 2024-02-05 DIAGNOSIS — Z01812 Encounter for preprocedural laboratory examination: Secondary | ICD-10-CM

## 2024-02-05 DIAGNOSIS — N882 Stricture and stenosis of cervix uteri: Secondary | ICD-10-CM | POA: Insufficient documentation

## 2024-02-05 DIAGNOSIS — E7800 Pure hypercholesterolemia, unspecified: Secondary | ICD-10-CM | POA: Insufficient documentation

## 2024-02-05 DIAGNOSIS — Z01818 Encounter for other preprocedural examination: Secondary | ICD-10-CM | POA: Diagnosis not present

## 2024-02-05 LAB — CBC
HCT: 40.2 % (ref 36.0–46.0)
Hemoglobin: 13.5 g/dL (ref 12.0–15.0)
MCH: 29.2 pg (ref 26.0–34.0)
MCHC: 33.6 g/dL (ref 30.0–36.0)
MCV: 86.8 fL (ref 80.0–100.0)
Platelets: 221 10*3/uL (ref 150–400)
RBC: 4.63 MIL/uL (ref 3.87–5.11)
RDW: 12.4 % (ref 11.5–15.5)
WBC: 5.5 10*3/uL (ref 4.0–10.5)
nRBC: 0 % (ref 0.0–0.2)

## 2024-02-13 ENCOUNTER — Encounter: Payer: Self-pay | Admitting: Obstetrics and Gynecology

## 2024-02-13 ENCOUNTER — Other Ambulatory Visit: Payer: Self-pay

## 2024-02-13 ENCOUNTER — Ambulatory Visit
Admission: RE | Admit: 2024-02-13 | Discharge: 2024-02-13 | Disposition: A | Discharge status: Home / Self Care | Attending: Obstetrics and Gynecology | Admitting: Obstetrics and Gynecology

## 2024-02-13 ENCOUNTER — Encounter
Admission: RE | Disposition: A | Discharge status: Home / Self Care | Payer: Self-pay | Source: Home / Self Care | Attending: Obstetrics and Gynecology

## 2024-02-13 ENCOUNTER — Ambulatory Visit: Payer: Self-pay | Admitting: Urgent Care

## 2024-02-13 ENCOUNTER — Ambulatory Visit: Payer: Self-pay | Admitting: Anesthesiology

## 2024-02-13 DIAGNOSIS — N950 Postmenopausal bleeding: Secondary | ICD-10-CM | POA: Diagnosis present

## 2024-02-13 DIAGNOSIS — Z79899 Other long term (current) drug therapy: Secondary | ICD-10-CM | POA: Diagnosis not present

## 2024-02-13 DIAGNOSIS — D509 Iron deficiency anemia, unspecified: Secondary | ICD-10-CM | POA: Insufficient documentation

## 2024-02-13 DIAGNOSIS — N882 Stricture and stenosis of cervix uteri: Secondary | ICD-10-CM | POA: Diagnosis not present

## 2024-02-13 DIAGNOSIS — N888 Other specified noninflammatory disorders of cervix uteri: Secondary | ICD-10-CM | POA: Diagnosis not present

## 2024-02-13 DIAGNOSIS — R9389 Abnormal findings on diagnostic imaging of other specified body structures: Secondary | ICD-10-CM | POA: Diagnosis present

## 2024-02-13 HISTORY — PX: HYSTEROSCOPY WITH D & C: SHX1775

## 2024-02-13 SURGERY — DILATATION AND CURETTAGE /HYSTEROSCOPY
Anesthesia: General | Site: Uterus

## 2024-02-13 MED ORDER — ORAL CARE MOUTH RINSE: 15.0000 mL | Freq: Once | OROMUCOSAL | Status: AC

## 2024-02-13 MED ORDER — ONDANSETRON HCL 4 MG/2ML IJ SOLN
INTRAMUSCULAR | Status: AC
  Filled 2024-02-13: qty 2

## 2024-02-13 MED ORDER — FENTANYL CITRATE (PF) 100 MCG/2ML IJ SOLN
INTRAMUSCULAR | Status: AC
  Filled 2024-02-13: qty 2

## 2024-02-13 MED ORDER — DEXAMETHASONE SODIUM PHOSPHATE 10 MG/ML IJ SOLN
INTRAMUSCULAR | Status: AC
  Filled 2024-02-13: qty 1

## 2024-02-13 MED ORDER — LIDOCAINE HCL (PF) 2 % IJ SOLN
INTRAMUSCULAR | Status: AC
  Filled 2024-02-13: qty 5

## 2024-02-13 MED ORDER — ACETAMINOPHEN 10 MG/ML IV SOLN
INTRAVENOUS | Status: AC
  Filled 2024-02-13: qty 100

## 2024-02-13 MED ORDER — PHENYLEPHRINE 80 MCG/ML (10ML) SYRINGE FOR IV PUSH (FOR BLOOD PRESSURE SUPPORT)
PREFILLED_SYRINGE | INTRAVENOUS | Status: DC | PRN
  Administered 2024-02-13: 80 ug via INTRAVENOUS

## 2024-02-13 MED ORDER — MIDAZOLAM HCL 2 MG/2ML IJ SOLN
INTRAMUSCULAR | Status: DC | PRN
  Administered 2024-02-13: 2 mg via INTRAVENOUS

## 2024-02-13 MED ORDER — CHLORHEXIDINE GLUCONATE 0.12 % MT SOLN
OROMUCOSAL | Status: AC
  Filled 2024-02-13: qty 15

## 2024-02-13 MED ORDER — FENTANYL CITRATE (PF) 100 MCG/2ML IJ SOLN: 25.0000 ug | INTRAMUSCULAR | Status: DC | PRN

## 2024-02-13 MED ORDER — PROPOFOL 10 MG/ML IV BOLUS
INTRAVENOUS | Status: AC
  Filled 2024-02-13: qty 20

## 2024-02-13 MED ORDER — LACTATED RINGERS IV SOLN
INTRAVENOUS | Status: DC
Start: 2024-02-13 — End: 2024-02-13

## 2024-02-13 MED ORDER — MIDAZOLAM HCL 2 MG/2ML IJ SOLN
INTRAMUSCULAR | Status: AC
  Filled 2024-02-13: qty 2

## 2024-02-13 MED ORDER — KETOROLAC TROMETHAMINE 30 MG/ML IJ SOLN
INTRAMUSCULAR | Status: AC
  Filled 2024-02-13: qty 1

## 2024-02-13 MED ORDER — 0.9 % SODIUM CHLORIDE (POUR BTL) OPTIME
TOPICAL | Status: DC | PRN
  Administered 2024-02-13: 500 mL

## 2024-02-13 MED ORDER — ACETAMINOPHEN 10 MG/ML IV SOLN
INTRAVENOUS | Status: DC | PRN
  Administered 2024-02-13: 1000 mg via INTRAVENOUS

## 2024-02-13 MED ORDER — FENTANYL CITRATE (PF) 100 MCG/2ML IJ SOLN
INTRAMUSCULAR | Status: DC | PRN
Start: 2024-02-13 — End: 2024-02-13
  Administered 2024-02-13: 25 ug via INTRAVENOUS
  Administered 2024-02-13: 50 ug via INTRAVENOUS

## 2024-02-13 MED ORDER — OXYCODONE HCL 5 MG PO TABS: 5.0000 mg | ORAL_TABLET | Freq: Once | ORAL | Status: DC | PRN

## 2024-02-13 MED ORDER — CHLORHEXIDINE GLUCONATE 0.12 % MT SOLN
15.0000 mL | Freq: Once | OROMUCOSAL | Status: AC
  Administered 2024-02-13: 15 mL via OROMUCOSAL

## 2024-02-13 MED ORDER — LACTATED RINGERS IV SOLN: INTRAVENOUS | Status: DC

## 2024-02-13 MED ORDER — ONDANSETRON HCL 4 MG/2ML IJ SOLN
INTRAMUSCULAR | Status: DC | PRN
Start: 2024-02-13 — End: 2024-02-13
  Administered 2024-02-13: 4 mg via INTRAVENOUS

## 2024-02-13 MED ORDER — ONDANSETRON HCL 4 MG/2ML IJ SOLN: 4.0000 mg | Freq: Once | INTRAMUSCULAR | Status: DC | PRN

## 2024-02-13 MED ORDER — OXYCODONE HCL 5 MG/5ML PO SOLN: 5.0000 mg | Freq: Once | ORAL | Status: DC | PRN

## 2024-02-13 MED ORDER — DEXAMETHASONE SODIUM PHOSPHATE 10 MG/ML IJ SOLN
INTRAMUSCULAR | Status: DC | PRN
Start: 2024-02-13 — End: 2024-02-13
  Administered 2024-02-13: 4 mg via INTRAVENOUS

## 2024-02-13 MED ORDER — PROPOFOL 1000 MG/100ML IV EMUL
INTRAVENOUS | Status: AC
  Filled 2024-02-13: qty 100

## 2024-02-13 MED ORDER — ACETAMINOPHEN 10 MG/ML IV SOLN: 1000.0000 mg | Freq: Once | INTRAVENOUS | Status: DC | PRN

## 2024-02-13 MED ORDER — POVIDONE-IODINE 10 % EX SWAB
2.0000 | Freq: Once | CUTANEOUS | Status: AC
  Administered 2024-02-13: 2 via TOPICAL

## 2024-02-13 MED ORDER — PROPOFOL 10 MG/ML IV BOLUS
INTRAVENOUS | Status: DC | PRN
  Administered 2024-02-13: 30 mg via INTRAVENOUS
  Administered 2024-02-13: 20 mg via INTRAVENOUS

## 2024-02-13 MED ORDER — PROPOFOL 500 MG/50ML IV EMUL
INTRAVENOUS | Status: DC | PRN
Start: 2024-02-13 — End: 2024-02-13
  Administered 2024-02-13: 150 ug/kg/min via INTRAVENOUS

## 2024-02-13 MED ORDER — PHENYLEPHRINE 80 MCG/ML (10ML) SYRINGE FOR IV PUSH (FOR BLOOD PRESSURE SUPPORT)
PREFILLED_SYRINGE | INTRAVENOUS | Status: AC
  Filled 2024-02-13: qty 10

## 2024-02-13 SURGICAL SUPPLY — 21 items
BAG PRESSURE INF REUSE 1000 (BAG) ×1 IMPLANT
BASIN KIT SINGLE STR (MISCELLANEOUS) ×1 IMPLANT
DEVICE MYOSURE LITE (MISCELLANEOUS) IMPLANT
DEVICE MYOSURE REACH (MISCELLANEOUS) IMPLANT
DRSG TELFA 3X8 NADH STRL (GAUZE/BANDAGES/DRESSINGS) IMPLANT
ELECTRODE REM PT RTRN 9FT ADLT (ELECTROSURGICAL) ×1 IMPLANT
GLOVE BIO SURGEON STRL SZ7 (GLOVE) ×1 IMPLANT
GLOVE INDICATOR 7.5 STRL GRN (GLOVE) ×1 IMPLANT
GOWN STRL REUS W/ TWL LRG LVL3 (GOWN DISPOSABLE) ×2 IMPLANT
KIT PROCEDURE FLUENT (KITS) ×1 IMPLANT
KIT TURNOVER CYSTO (KITS) ×1 IMPLANT
MANIFOLD NEPTUNE II (INSTRUMENTS) ×1 IMPLANT
NS IRRIG 500ML POUR BTL (IV SOLUTION) IMPLANT
PACK DNC HYST (MISCELLANEOUS) ×1 IMPLANT
PAD PREP OB/GYN DISP 24X41 (PERSONAL CARE ITEMS) ×1 IMPLANT
SCRUB CHG 4% DYNA-HEX 4OZ (MISCELLANEOUS) ×1 IMPLANT
SEAL ROD LENS SCOPE MYOSURE (ABLATOR) IMPLANT
SET CYSTO W/LG BORE CLAMP LF (SET/KITS/TRAYS/PACK) IMPLANT
SOL .9 NS 3000ML IRR UROMATIC (IV SOLUTION) ×1 IMPLANT
SOL PREP POV-IOD 4OZ 10% (MISCELLANEOUS) IMPLANT
TUBING CONNECTING 10 (TUBING) ×1 IMPLANT

## 2024-02-13 NOTE — Transfer of Care (Signed)
 Immediate Anesthesia Transfer of Care Note  Patient: Meghan Harper  Procedure(s) Performed: DILATATION AND CURETTAGE /HYSTEROSCOPY (Uterus)  Patient Location: PACU  Anesthesia Type:MAC  Level of Consciousness: awake, alert , and oriented  Airway & Oxygen Therapy: Patient Spontanous Breathing and Patient connected to face mask oxygen  Post-op Assessment: Report given to RN, Post -op Vital signs reviewed and stable, and Patient moving all extremities X 4  Post vital signs: Reviewed and stable  Last Vitals:  Vitals Value Taken Time  BP 97/56   Temp    Pulse 61 02/13/24 1213  Resp 11 02/13/24 1213  SpO2 98 % 02/13/24 1213  Vitals shown include unfiled device data.  Last Pain:  Vitals:   02/13/24 0956  TempSrc: Temporal  PainSc: 0-No pain         Complications: No notable events documented.

## 2024-02-13 NOTE — Anesthesia Postprocedure Evaluation (Signed)
 Anesthesia Post Note  Patient: Meghan Harper  Procedure(s) Performed: DILATATION AND CURETTAGE /HYSTEROSCOPY (Uterus)  Patient location during evaluation: PACU Anesthesia Type: General Level of consciousness: awake and alert Pain management: pain level controlled Vital Signs Assessment: post-procedure vital signs reviewed and stable Respiratory status: spontaneous breathing, nonlabored ventilation and respiratory function stable Cardiovascular status: blood pressure returned to baseline and stable Postop Assessment: no apparent nausea or vomiting Anesthetic complications: no   No notable events documented.   Last Vitals:  Vitals:   02/13/24 1245 02/13/24 1306  BP: 129/81 136/62  Pulse: (!) 58 (!) 54  Resp: 12 16  Temp:  (!) 36.2 C  SpO2: 98% 97%    Last Pain:  Vitals:   02/13/24 1306  TempSrc: Temporal  PainSc: 0-No pain                 Baltazar Bonier

## 2024-02-13 NOTE — Op Note (Signed)
 Operative Report Hysteroscopy with Dilation and Curettage   Indications: PMB   Pre-operative Diagnosis: Thickened endometrial stripe   Post-operative Diagnosis: same.  Procedure: 1. Exam under anesthesia 2. Fractional D&C 3. Hysteroscopy  Surgeon: [name], MD  Assistant(s):  None  Anesthesia: Monitored Local Anesthesia with Sedation  Anesthesiologist: No responsible provider has been recorded for the case. Anesthesiologist: Baltazar Bonier, MD CRNA: Ulanda Gambles, CRNA  Estimated Blood Loss:  Minimal  Total IV Fluids:  Urine Output:  Total Fluid Deficit:  440 mL          Specimens: Endocervical curettings         Complications:  Unable to enter the endometrial cavity due to cervical stenosis- no patient tolerated the procedure well.         Disposition: PACU - hemodynamically stable.         Condition: stable  Findings: Uterus measuring 6 cm by sound; normal vagina, perineum. Very small cervix, unable to be palpated, but visualized with an Geophysicist/field seismologist. Cervix stenotic.    Indication for procedure/Consents: 68 y.o. F here for scheduled surgery for the aforementioned diagnoses.    Risks of surgery were discussed with the patient including but not limited to: bleeding which may require transfusion; infection which may require antibiotics; injury to uterus or surrounding organs; intrauterine scarring which may impair future fertility; need for additional procedures including laparotomy or laparoscopy; and other postoperative/anesthesia complications. Written informed consent was obtained.    Procedure Details:   D&C/ Myosure  The patient was taken to the operating room where anesthesia was administered and was found to be adequate.  After a formal and adequate timeout was performed, she was placed in the dorsal lithotomy position and examined with the above findings. She was then prepped and draped in the sterile manner.   Her bladder was  catheterized for an estimated amount of clear, yellow urine. A weighed speculum was then placed in the patient's vagina and a single tooth tenaculum was applied to the anterior lip of the cervix. Additional retraction was requested and given.  An endocervical curette was performed to a depth of 3cm. Her cervix was serially dilated to 15 [place] using Hanks dilators. The hysteroscope was introduced under direct observation to allow visualization of the cervical canal. Using lactated ringers  as a distention medium to see, the canal was unable to be clearly visualized, but a false passage was suspected. I continued with vaginoscopy without success at finding the cerivcal passage, but was concerned that the false passage might lead into the abdomen and therefore after a thorough view, abandoned looking.  The patient tolerated the procedure well and was taken to the recovery area awake and in stable condition. She received iv acetaminophen  and Toradol  prior to leaving the OR.  The patient will be discharged to home as per PACU criteria. Routine postoperative instructions given.  She was prescribed Ibuprofen and Colace.  She will follow up in the clinic in two weeks for postoperative evaluation.  I will plan to present repeat u/s after progesterone or hysterectomy. She could also see gyn onc for review.

## 2024-02-13 NOTE — Discharge Instructions (Signed)
 Discharge instructions after a hysteroscopy with dilation and curettage  Signs and Symptoms to Report  Call our office at [phone] if you have any of the following:    Fever over 100.4 degrees or higher  Severe stomach pain not relieved with pain medications  Bright red bleeding that's heavier than a period that does not slow with rest after the first 24 hours  To go the bathroom a lot (frequency), you can't hold your urine (urgency), or it hurts when you empty your bladder (urinate)  Chest pain  Shortness of breath  Pain in the calves of your legs  Severe nausea and vomiting not relieved with anti-nausea medications  Any concerns  What You Can Expect after Surgery  You may see some pink tinged, bloody fluid. This is normal. You may also have cramping for several days.   Activities after Your Discharge Follow these guidelines to help speed your recovery at home:  Don't drive if you are in pain or taking narcotic pain medicine. You may drive when you can safely slam on the brakes, turn the wheel forcefully, and rotate your torso comfortably. This is typically 4-7 days. Practice in a parking lot or side street prior to attempting to drive regularly.   Ask others to help with household chores for 4 weeks.  Don't do strenuous activities, exercises, or sports like vacuuming, tennis, squash, etc. until your doctor says it is safe to do so.  Walk as you feel able. Rest often since it may take a week or two for your energy level to return to normal.   You may climb stairs  Avoid constipation:   -Eat fruits, vegetables, and whole grains. Eat small meals as your appetite will take time to return to normal.   -Drink 6 to 8 glasses of water each day unless your doctor has told you to limit your fluids.   -Use a laxative or stool softener as needed if constipation becomes a problem. You may take Miralax, metamucil, Citrucil, Colace, Senekot, FiberCon, etc. If this does not relieve the  constipation, try two tablespoons of Milk Of Magnesia every 8 hours until your bowels move.   You may shower.   Do not get in a hot tub, swimming pool, etc. until your doctor agrees.  Do not douche, use tampons, or have sex until your doctor says it is okay, usually about 2 weeks.  Take your pain medicine when you need it. The medicine may not work as well if the pain is bad.  Take the medicines you were taking before surgery. Other medications you might need are pain medications (ibuprofen), medications for constipation (Colace) and nausea medications (Zofran).

## 2024-02-13 NOTE — Anesthesia Preprocedure Evaluation (Addendum)
 Anesthesia Evaluation  Patient identified by MRN, date of birth, ID band Patient awake    Reviewed: Allergy & Precautions, H&P , NPO status , Patient's Chart, lab work & pertinent test results  Airway Mallampati: II  TM Distance: >3 FB Neck ROM: full    Dental no notable dental hx.    Pulmonary neg pulmonary ROS   Pulmonary exam normal        Cardiovascular negative cardio ROS Normal cardiovascular exam     Neuro/Psych negative neurological ROS  negative psych ROS   GI/Hepatic Neg liver ROS, hiatal hernia,neg GERD  ,,  Endo/Other  negative endocrine ROS    Renal/GU negative Renal ROS  negative genitourinary   Musculoskeletal  (+) Arthritis ,    Abdominal   Peds  Hematology  (+) Blood dyscrasia (anemia treated with iron  supplementation), anemia   Anesthesia Other Findings Past Medical History: No date: Arthritis No date: Bilateral carpal tunnel syndrome No date: Family history of breast cancer No date: Family history of CLL (chronic lymphoid leukemia) No date: Family history of colon cancer No date: Family history of melanoma No date: Family history of pancreatic cancer No date: Heart murmur No date: History of hiatal hernia No date: Iron  deficiency anemia No date: Irritable bowel syndrome with diarrhea 2022: Malignant neoplasm of upper-inner quadrant of right female  breast, unspecified estrogen receptor status (HCC) 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 Excised:               04/21/2010 No date: Osteoarthritis of spine with radiculopathy, cervical region No date: Personal history of radiation therapy No date: Post-menopausal bleeding No date: Pre-diabetes No date: Pure hypercholesterolemia No date: Wears contact lenses  Past Surgical History: 11/11/2023: BIOPSY     Comment:  Procedure: BIOPSY;  Surgeon: Shane Darling, MD;                 Location: [facility];  Service: Endoscopy;; 1987, 2013: BREAST BIOPSY; Right     Comment:  fibroadenoma x 2 04/06/2021: BREAST BIOPSY; Right     Comment:  u/s bx-"venus" clip-positive 1978: BREAST EXCISIONAL BIOPSY; Left No date: BREAST FIBROADENOMA SURGERY 05/08/2021: BREAST LUMPECTOMY WITH SENTINEL LYMPH NODE BIOPSY; Right     Comment:  Procedure: BREAST LUMPECTOMY WITH SENTINEL LYMPH NODE               BX;  Surgeon: Marshall Skeeter, MD;  Location: [facility]               ORS;  Service: General;  Laterality: Right; 08/28/2017: CARPAL TUNNEL RELEASE; Right     Comment:  Procedure: CARPAL TUNNEL RELEASE ENDOSCOPIC;  Surgeon:               Elner Hahn, MD;  Location: [facility];                Service: Orthopedics;  Laterality: Right; 11/06/2017: CARPAL TUNNEL RELEASE; Left     Comment:  Procedure: CARPAL TUNNEL RELEASE ENDOSCOPIC;  Surgeon:               Elner Hahn, MD;  Location: [facility];                Service: Orthopedics;  Laterality: Left; 09/26/2015: COLONOSCOPY; N/A     Comment:  Procedure: COLONOSCOPY;  Surgeon: Cassie Click, MD;  Location: [facility];  Service: Endoscopy;                Laterality: N/A; 04/16/2022: COLONOSCOPY WITH PROPOFOL ; N/A     Comment:  Procedure: COLONOSCOPY WITH PROPOFOL ;  Surgeon:               Shane Darling, MD;  Location: [facility];                Service: Endoscopy;  Laterality: N/A; 11/11/2023: COLONOSCOPY WITH PROPOFOL ; N/A     Comment:  Procedure: COLONOSCOPY WITH PROPOFOL ;  Surgeon:               Shane Darling, MD;  Location: [facility];                Service: Endoscopy;  Laterality: N/A; 11/11/2023: ESOPHAGOGASTRODUODENOSCOPY (EGD) WITH PROPOFOL ; N/A     Comment:  Procedure: ESOPHAGOGASTRODUODENOSCOPY (EGD) WITH               PROPOFOL ;  Surgeon: Shane Darling, MD;  Location:               [facility];  Service: Endoscopy;  Laterality: N/A; No date: TOOTH  EXTRACTION  BMI    Body Mass Index: 32.08 kg/m      Reproductive/Obstetrics negative OB ROS                              Anesthesia Physical Anesthesia Plan  ASA: 2  Anesthesia Plan: General   Post-op Pain Management: Ofirmev  IV (intra-op)* and Toradol  IV (intra-op)*   Induction: Intravenous  PONV Risk Score and Plan: Propofol  infusion and TIVA  Airway Management Planned: Natural Airway  Additional Equipment:   Intra-op Plan:   Post-operative Plan:   Informed Consent: I have reviewed the patients History and Physical, chart, labs and discussed the procedure including the risks, benefits and alternatives for the proposed anesthesia with the patient or authorized representative who has indicated his/her understanding and acceptance.     Dental Advisory Given  Plan Discussed with: CRNA and Surgeon  Anesthesia Plan Comments: (Back-up LMA with GA discussed)         Anesthesia Quick Evaluation

## 2024-02-13 NOTE — Interval H&P Note (Signed)
 History and Physical Interval Note:  02/13/2024 11:13 AM  Meghan Harper  has presented today for surgery, with the diagnosis of postmenopausal bleeding thickened stripe.  The various methods of treatment have been discussed with the patient and family. After consideration of risks, benefits and other options for treatment, the patient has consented to  Procedure(s) with comments: DILATATION AND CURETTAGE /HYSTEROSCOPY (N/A) - FRACTIONAL D&C, HYSTEROSCOPY, POSSIBLE POLYPECTOMY as a surgical intervention.  The patient's history has been reviewed, patient examined, no change in status, stable for surgery.  I have reviewed the patient's chart and labs.  Questions were answered to the patient's satisfaction.     [name]

## 2024-02-13 NOTE — Anesthesia Procedure Notes (Signed)
 Procedure Name: MAC Date/Time: 02/13/2024 11:22 AM  Performed by: Ulanda Gambles, CRNAPre-anesthesia Checklist: Patient identified, Emergency Drugs available, Suction available and Patient being monitored Oxygen Delivery Method: Simple face mask

## 2024-02-14 ENCOUNTER — Encounter: Payer: Self-pay | Admitting: Obstetrics and Gynecology

## 2024-02-14 LAB — SURGICAL PATHOLOGY

## 2024-03-11 ENCOUNTER — Encounter: Payer: Self-pay | Admitting: Internal Medicine

## 2024-03-12 ENCOUNTER — Ambulatory Visit
Admission: RE | Admit: 2024-03-12 | Discharge: 2024-03-12 | Disposition: A | Discharge status: Home / Self Care | Source: Ambulatory Visit | Attending: General Surgery | Admitting: General Surgery

## 2024-03-12 DIAGNOSIS — Z853 Personal history of malignant neoplasm of breast: Secondary | ICD-10-CM | POA: Diagnosis present

## 2024-03-18 ENCOUNTER — Inpatient Hospital Stay

## 2024-03-18 ENCOUNTER — Inpatient Hospital Stay: Attending: Internal Medicine | Admitting: Obstetrics and Gynecology

## 2024-03-18 VITALS — BP 138/84 | HR 72 | Temp 98.2°F | Resp 20 | Wt 185.4 lb

## 2024-03-18 DIAGNOSIS — N950 Postmenopausal bleeding: Secondary | ICD-10-CM

## 2024-03-18 DIAGNOSIS — N882 Stricture and stenosis of cervix uteri: Secondary | ICD-10-CM | POA: Diagnosis not present

## 2024-03-18 DIAGNOSIS — Z7189 Other specified counseling: Secondary | ICD-10-CM | POA: Diagnosis not present

## 2024-03-18 DIAGNOSIS — R9389 Abnormal findings on diagnostic imaging of other specified body structures: Secondary | ICD-10-CM | POA: Diagnosis not present

## 2024-03-18 NOTE — Progress Notes (Signed)
 Gynecologic Oncology Consult Visit   Referring Provider:   Chief Complaint: PMB   Subjective:  Meghan Harper is a 68 y.o. GOPO female who is seen in consultation from Dr. 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   Patient has history of abnormal pap in 1996 and underwent cryosurgery. Follow up paps have been negative/normal and negative for hpv. Last pap 10/24/21- NILM  She saw Dr Alvia Awkward in February 2025 for PMB, approximately 1 tablespoon of blood. Workup included thickened endometrium- 18.9 mm. Unable to dilate cervix for EMBx.   02/13/24- D&C/Hysteroscopy performed for thickened endometrial stripe and PMB  Op Note: An endocervical curette was performed to a depth of 3cm. Her cervix was serially dilated to 15 [place] using Hanks dilators. The hysteroscope was introduced under direct observation to allow visualization of the cervical canal. Using lactated ringers  as a distention medium to see, the canal was unable to be clearly visualized, but a false passage was suspected. I continued with vaginoscopy without success at finding the cervical passage, but was concerned that the false passage might lead into the abdomen and therefore after a thorough view, abandoned looking.   1. Endocervix, curettage:       - FEW FRAGMENTS OF METAPLASTIC SQUAMOUS EPITHELIUM.       - NEGATIVE FOR DYSPLASIA AND MALIGNANCY.   Patient wished to avoid hysterectomy d/t concern of prolapse or bladder issues long term. 4 mo u/s for monitoring of ES was discussed vs gyn onc. She elected to be seen by gyn onc for second opinion.   She has a history of iron  deficiency anemia and breast cancer s/p lumpectomy 2022, on Aromatase inhibitor.   She has not had additional episodes of vaginal bleeding.    Problem List: Patient Active Problem List   Diagnosis Date Noted   Abnormal finding present on diagnostic imaging of uterus 03/18/2024   Genetic testing 05/09/2021    Family history of breast cancer 04/18/2021   Family history of pancreatic cancer 04/18/2021   Family history of colon cancer 04/18/2021   Family history of melanoma 04/18/2021   Family history of CLL (chronic lymphoid leukemia) 04/18/2021   Carcinoma of upper-inner quadrant of right breast in female, estrogen receptor positive (HCC) 04/18/2021   Prediabetes 08/02/2017   Postmenopausal bleeding 08/02/2017   Primary osteoarthritis of both hands 02/27/2016   Osteoarthritis of spine with radiculopathy, cervical region 02/27/2016   Pure hypercholesterolemia 02/27/2016    Past Medical History: Past Medical History:  Diagnosis Date   Arthritis    Bilateral carpal tunnel syndrome    Breast cancer (HCC)    right 2022   Family history of breast cancer    Family history of CLL (chronic lymphoid leukemia)    Family history of colon cancer    Family history of melanoma    Family history of pancreatic cancer    Heart murmur    History of hiatal hernia    Iron  deficiency anemia    Irritable bowel syndrome with diarrhea    Malignant neoplasm of upper-inner quadrant of right female breast, unspecified estrogen receptor status (HCC) 2022   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 Excised: 04/21/2010   Osteoarthritis of spine with radiculopathy, cervical region    Personal history of radiation therapy    Post-menopausal bleeding    Pre-diabetes    Pure hypercholesterolemia    Wears contact lenses  Past Surgical History: Past Surgical History:  Procedure Laterality Date   BIOPSY  11/11/2023   Procedure: BIOPSY;  Surgeon: Shane Darling, MD;  Location: [facility];  Service: Endoscopy;;   BREAST BIOPSY Right 1987, 2013   fibroadenoma x 2   BREAST BIOPSY Right 04/06/2021   u/s bx-"venus" clip-positive   BREAST EXCISIONAL BIOPSY Left 1978   BREAST FIBROADENOMA SURGERY     BREAST LUMPECTOMY WITH SENTINEL LYMPH NODE BIOPSY Right 05/08/2021    Procedure: BREAST LUMPECTOMY WITH SENTINEL LYMPH NODE BX;  Surgeon: Marshall Skeeter, MD;  Location: [facility];  Service: General;  Laterality: Right;   CARPAL TUNNEL RELEASE Right 08/28/2017   Procedure: CARPAL TUNNEL RELEASE ENDOSCOPIC;  Surgeon: Elner Hahn, MD;  Location: [facility];  Service: Orthopedics;  Laterality: Right;   CARPAL TUNNEL RELEASE Left 11/06/2017   Procedure: CARPAL TUNNEL RELEASE ENDOSCOPIC;  Surgeon: Elner Hahn, MD;  Location: [facility];  Service: Orthopedics;  Laterality: Left;   COLONOSCOPY N/A 09/26/2015   Procedure: COLONOSCOPY;  Surgeon: Cassie Click, MD;  Location: [facility];  Service: Endoscopy;  Laterality: N/A;   COLONOSCOPY WITH PROPOFOL  N/A 04/16/2022   Procedure: COLONOSCOPY WITH PROPOFOL ;  Surgeon: Shane Darling, MD;  Location: [facility];  Service: Endoscopy;  Laterality: N/A;   COLONOSCOPY WITH PROPOFOL  N/A 11/11/2023   Procedure: COLONOSCOPY WITH PROPOFOL ;  Surgeon: Shane Darling, MD;  Location: [facility];  Service: Endoscopy;  Laterality: N/A;   ESOPHAGOGASTRODUODENOSCOPY (EGD) WITH PROPOFOL  N/A 11/11/2023   Procedure: ESOPHAGOGASTRODUODENOSCOPY (EGD) WITH PROPOFOL ;  Surgeon: Shane Darling, MD;  Location: [facility];  Service: Endoscopy;  Laterality: N/A;   HYSTEROSCOPY WITH D & C N/A 02/13/2024   Procedure: DILATATION AND CURETTAGE /HYSTEROSCOPY;  Surgeon: [name], MD;  Location: [facility];  Service: Gynecology;  Laterality: N/A;   TOOTH EXTRACTION      Past Gynecologic History:  Menarche: age 32 Post menopausal Sexually active.  Hx of abnormal pap smear & cryosurgery in 1996. Records not available.   OB History:  OB History  Gravida Para Term Preterm AB Living  0 0 0 0 0 0  SAB IAB Ectopic Multiple Live Births  0 0 0 0 0    Family History: Family History  Problem Relation Age of Onset   Colon cancer Mother        dx 62s   Other Brother        CLL and skin cancers    Breast cancer Maternal Aunt 2   Lung cancer Maternal Aunt    Breast cancer Paternal Aunt 68   Pancreatic cancer Paternal Aunt    Melanoma Maternal Uncle    Breast cancer Niece        dx 30s, "BRCA pos"    Social History: Social History   Socioeconomic History   Marital status: Single    Spouse name: Not on file   Number of children: 0   Years of education: Not on file   Highest education level: Not on file  Occupational History   Not on file  Tobacco Use   Smoking status: Never   Smokeless tobacco: Never  Vaping Use   Vaping status: Never Used  Substance and Sexual Activity   Alcohol use: Yes    Alcohol/week: 0.0 standard drinks of alcohol    Comment: may have a drink 3-4x/yr   Drug use: No   Sexual activity: Yes    Birth control/protection: Post-menopausal  Other Topics Concern  Not on file  Social History Narrative   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    Social Drivers of Health   Financial Resource Strain: Low Risk  (10/10/2023)   Received from [facility]   Overall Financial Resource Strain (CARDIA)    Difficulty of Paying Living Expenses: Not hard at all  Food Insecurity: No Food Insecurity (03/18/2024)   Hunger Vital Sign    Worried About Running Out of Food in the Last Year: Never true    Ran Out of Food in the Last Year: Never true  Transportation Needs: No Transportation Needs (03/18/2024)   PRAPARE - Administrator, Civil Service (Medical): No    Lack of Transportation (Non-Medical): No  Physical Activity: Not on file  Stress: Not on file  Social Connections: Not on file  Intimate Partner Violence: Not At Risk (03/18/2024)   Humiliation, Afraid, Rape, and Kick questionnaire    Fear of Current or Ex-Partner: No    Emotionally Abused: No    Physically Abused: No    Sexually Abused: No    Allergies: Allergies  Allergen Reactions   Wound Dressing Adhesive Itching    Sensitive to Steri strips and Band aids  adhesive   Meloxicam Itching    Current Medications: Current Outpatient Medications  Medication Sig Dispense Refill   anastrozole  (ARIMIDEX ) 1 MG tablet TAKE 1 TABLET BY MOUTH DAILY (Patient taking differently: Take 1 mg by mouth at bedtime.) 90 tablet 1   Biotin 2500 MCG CHEW Chew 2,500 mcg by mouth 2 (two) times daily.     Calcium Carb-Cholecalciferol (CALCIUM 600 + D PO) Take 1 tablet by mouth 2 (two) times daily.     Coenzyme Q10 (COQ10) 100 MG CAPS Take 100 mg by mouth daily.     Collagen-Boron-Hyaluronic Acid (MOVE FREE ULTRA JOINT HEALTH PO) Take 1 tablet by mouth daily.     Cyanocobalamin  (B-12) 5000 MCG SUBL Place 5,000 mcg under the tongue daily.     DM-Phenylephrine -Acetaminophen  10-5-325 MG TABS Take 2 tablets by mouth every 4 (four) hours as needed (congestion).     Fe Bisgly-Vit C-Vit B12-FA (GENTLE IRON ) 28-60-0.008-0.4 MG CAPS Take 1 tablet by mouth daily.     fexofenadine (ALLEGRA) 180 MG tablet Take 180 mg by mouth daily.     KRILL OIL OMEGA-3 PO Take 1 capsule by mouth daily.     Magnesium Chloride-Calcium (SLOW MAGNESIUM/CALCIUM PO) Take 1 tablet by mouth 2 (two) times daily.     Multiple Vitamin (MULTIVITAMIN) capsule Take 1 capsule by mouth daily. Centrum Silver     Multiple Vitamins-Minerals (AIRBORNE PO) Take 3 capsules by mouth daily as needed (immune support).     Pamabrom 50 MG CAPS Take 50 mg by mouth daily as needed (ankle swelling).     Polyvinyl Alcohol-Povidone (CLEAR EYES ALL SEASONS) 5-6 MG/ML SOLN Place 1 drop into both eyes daily. May use a second time as needed for dryness     rosuvastatin (CRESTOR) 5 MG tablet Take 5 mg by mouth at bedtime.     Turmeric 500 MG CAPS Take 500 mg by mouth 2 (two) times daily.     No current facility-administered medications for this visit.   Review of Systems General: negative for fevers, changes in weight or night sweats Skin: negative for changes in moles or sores or rash Eyes: negative for changes in vision HEENT:  negative for change in hearing, tinnitus, voice changes Pulmonary: negative for dyspnea, orthopnea, productive cough, wheezing  Cardiac: negative for palpitations, pain Gastrointestinal: negative for nausea, vomiting, constipation, diarrhea, hematemesis, hematochezia Genitourinary/Sexual: negative for dysuria, retention, hematuria, incontinence Ob/Gyn:  negative for pain. Positive for bleeding Musculoskeletal: negative for pain, joint pain, back pain Hematology: negative for easy bruising Neurologic/Psych: negative for headaches, seizures, paralysis, weakness, numbness   Objective:  Physical Examination:  BP 138/84   Pulse 72   Temp 98.2 F (36.8 C)   Resp 20   Wt 185 lb 6.4 oz (84.1 kg)   SpO2 100%   BMI 32.33 kg/m     ECOG Performance Status: 0 - Asymptomatic  GENERAL: Patient is a well appearing female in no acute distress HEENT:  Sclerae anicteric.  Oropharynx clear and moist. No ulcerations or evidence of oropharyngeal candidiasis. Neck is supple.  NODES:  No cervical, supraclavicular, or axillary lymphadenopathy palpated.  LUNGS:  Clear to auscultation bilaterally.  No wheezes or rhonchi. HEART:  Regular rate and rhythm. No murmur appreciated. ABDOMEN:  Soft, nontender.  Positive, normoactive bowel sounds. No organomegaly palpated. MSK:  No focal spinal tenderness to palpation. Full range of motion bilaterally in the upper extremities. EXTREMITIES:  No peripheral edema.   SKIN:  Clear with no obvious rashes or skin changes. NEURO:  Nonfocal. Well oriented.  Appropriate affect.  Pelvic: Exam chaperoned by NP EGBUS: no lesions Cervix: atrophic. Flush. The os is not open and appears as a slit.  Vagina: no lesions, no discharge or bleeding Uterus: normal size, nontender, mobile Adnexa: no palpable masses Rectovaginal: confirmatory  Lab Review No labs on site  Radiologic Imaging: Per hpi    Assessment:  Meghan Harper is a 68 y.o. 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   S/p cryotherapy  Medical co-morbidities complicating care: h/o breast cancer, h/o melanoma in situ, pre-diabetes, Body mass index is 32.33 kg/m.  Plan:   Problem List Items Addressed This Visit       Other   Abnormal finding present on diagnostic imaging of uterus   Postmenopausal bleeding - Primary   Other Visit Diagnoses       Counseling and coordination of care           We discussed options for management including observation with serial imaging; repeat exam under anesthesia with a LEEP procedure to remove the outer part of the cervix and hopefully identify the endocervical canal with operative hysteroscopy and MyoSure; versus hysterectomy with sentinel lymph node injection and mapping and possible node sampling based on intraoperative frozen section.   She does have risk factors for endometrial cancer and unfortunately due to anatomy and prior cryotherapy has such severe cervical stenosis that evaluation of the endometrial cavity is not possible.  We do not favor observation with serial imaging given her risk factors for endometrial cancer.  Is very concerned about complications of hysterectomy and pelvic organ prolapse.  The other option discussed was obtaining a pet scan to evaluate if the intrauterine contents were FDG avid.  She stated that if the PET scanning's were positive she would be more inclined to proceed with surgery.  Given her history of breast cancer and melanoma as well as extensive family history and this abnormal uterine finding on imaging with vaginal bleeding and inability to evaluate the endometrial canal PET scan is an option to assess further for malignancy.  Moreover is a situation where definitive surgery is not acceptable to the patient.  Risks of hysterectomy were discussed in detail.  These include infection, anesthesia, bleeding, transfusion, wound separation, vaginal cuff dehiscence, medical issues (blood clots, stroke, heart attack, fluid in the lungs, pneumonia, abnormal heart rhythm, death), possible re-exploratory surgery or readmission, injury to adjacent organs (bowel, bladder, blood vessels, nerves, ureters).  If she does opt to proceed with hysterectomy will have to review further the risks associated with sentinel lymph node injection mapping and biopsy which include lymphocyst and lymphedema and allergic reaction.  Suggested return to clinic after PET imaging.  The patient's diagnosis, an outline of the further diagnostic and laboratory studies which will be required, the recommendation for surgery, and alternatives were discussed with her and her accompanying family members.  All questions were answered to their satisfaction.  A total of 45 minutes were spent with the patient/family today;>50% was spent in education, counseling and coordination of care for postmenopausal bleeding, abnormal uterine findings on imaging with thickened endometrial stripe, severe cervical stenosis that precludes the ability to sample the uterus.   Kenney Peacemaker, DNP, AGNP-C, AOCNP [facility] [phone] (clinic)  I personally had a face to face interaction and evaluated the patient jointly with the NP, Ms. Kenney Peacemaker.  I have reviewed her history and available records and have performed the key portions of the physical exam including inguinal lymph node survey, abdominal exam, pelvic exam with my findings confirming those documented above by the APP.  I have discussed the case with the APP and the patient.  I agree with the above documentation, assessment and plan which was fully formulated by me.  Counseling was completed by me.   I personally saw the patient and performed a substantive portion of this encounter in conjunction with the listed APP as documented  above.  Jamela Cumbo [place], MD      CC:  [name], MD [address] [place],  [place] [address] [phone]

## 2024-03-24 ENCOUNTER — Encounter
Admission: RE | Admit: 2024-03-24 | Discharge: 2024-03-24 | Disposition: A | Discharge status: Home / Self Care | Source: Ambulatory Visit | Attending: Nurse Practitioner | Admitting: Nurse Practitioner

## 2024-03-24 DIAGNOSIS — N950 Postmenopausal bleeding: Secondary | ICD-10-CM | POA: Diagnosis present

## 2024-03-24 DIAGNOSIS — N882 Stricture and stenosis of cervix uteri: Secondary | ICD-10-CM | POA: Insufficient documentation

## 2024-03-24 DIAGNOSIS — Z923 Personal history of irradiation: Secondary | ICD-10-CM | POA: Insufficient documentation

## 2024-03-24 DIAGNOSIS — K449 Diaphragmatic hernia without obstruction or gangrene: Secondary | ICD-10-CM | POA: Diagnosis not present

## 2024-03-24 DIAGNOSIS — R9389 Abnormal findings on diagnostic imaging of other specified body structures: Secondary | ICD-10-CM | POA: Diagnosis present

## 2024-03-24 DIAGNOSIS — Z853 Personal history of malignant neoplasm of breast: Secondary | ICD-10-CM | POA: Insufficient documentation

## 2024-03-24 LAB — GLUCOSE, CAPILLARY: Glucose-Capillary: 112 mg/dL — ABNORMAL HIGH (ref 70–99)

## 2024-03-24 MED ORDER — FLUDEOXYGLUCOSE F - 18 (FDG) INJECTION
10.0200 | Freq: Once | INTRAVENOUS | Status: AC | PRN
  Administered 2024-03-24: 10.02 via INTRAVENOUS

## 2024-04-01 ENCOUNTER — Inpatient Hospital Stay: Attending: Internal Medicine | Admitting: Obstetrics and Gynecology

## 2024-04-01 VITALS — BP 143/85 | HR 73 | Resp 18 | Ht 63.5 in | Wt 184.0 lb

## 2024-04-01 DIAGNOSIS — Z806 Family history of leukemia: Secondary | ICD-10-CM | POA: Insufficient documentation

## 2024-04-01 DIAGNOSIS — Z800 Family history of malignant neoplasm of digestive organs: Secondary | ICD-10-CM | POA: Insufficient documentation

## 2024-04-01 DIAGNOSIS — Z803 Family history of malignant neoplasm of breast: Secondary | ICD-10-CM | POA: Insufficient documentation

## 2024-04-01 DIAGNOSIS — R9389 Abnormal findings on diagnostic imaging of other specified body structures: Secondary | ICD-10-CM | POA: Diagnosis present

## 2024-04-01 DIAGNOSIS — C50911 Malignant neoplasm of unspecified site of right female breast: Secondary | ICD-10-CM | POA: Insufficient documentation

## 2024-04-01 DIAGNOSIS — Z79811 Long term (current) use of aromatase inhibitors: Secondary | ICD-10-CM | POA: Insufficient documentation

## 2024-04-01 DIAGNOSIS — N882 Stricture and stenosis of cervix uteri: Secondary | ICD-10-CM | POA: Insufficient documentation

## 2024-04-01 NOTE — Progress Notes (Signed)
 Gynecologic Oncology Interval Visit   Referring Provider:   Chief Complaint: PMB   Subjective:  Meghan Harper is a 68 y.o. GOPO female who is seen in consultation from Dr. Alfredo Inch for PMB, severe cervical stenosis that precludes the ability to sample the uterus.  PET 03/24/2024 IMPRESSION: No specific areas of abnormal radiotracer uptake. This includes along the uterus. Please correlate with known history and dedicated further evaluation such as ultrasound or MRI as clinically appropriate.   Large hiatal hernia. Mild uptake along herniated stomach, nonspecific    Gynecologic Oncology History: Meghan Harper is a pleasant female who is seen in consultation from Dr. Alfredo Inch for PMB, severe cervical stenosis that precludes the ability to sample the uterus.  Patient has history of abnormal pap in 1996 and underwent cryosurgery. Follow up paps have been negative/normal and negative for hpv. Last pap 10/24/21- NILM  She saw Dr Alvia Awkward in February 2025 for PMB, approximately 1 tablespoon of blood. Workup included thickened endometrium- 18.9 mm. Unable to dilate cervix for EMBx.   02/13/24- D&C/Hysteroscopy performed for thickened endometrial stripe and PMB  Op Note: An endocervical curette was performed to a depth of 3cm. Her cervix was serially dilated to 15 [place] using Hanks dilators. The hysteroscope was introduced under direct observation to allow visualization of the cervical canal. Using lactated ringers  as a distention medium to see, the canal was unable to be clearly visualized, but a false passage was suspected. I continued with vaginoscopy without success at finding the cervical passage, but was concerned that the false passage might lead into the abdomen and therefore after a thorough view, abandoned looking.   1. Endocervix, curettage:       - FEW FRAGMENTS OF METAPLASTIC SQUAMOUS EPITHELIUM.       - NEGATIVE FOR DYSPLASIA AND MALIGNANCY.   Patient wished to avoid  hysterectomy d/t concern of prolapse or bladder issues long term. 4 mo u/s for monitoring of ES was discussed vs gyn onc. She elected to be seen by gyn onc for second opinion.   We recommended a PET scan for further evaluation.     She has a history of iron  deficiency anemia and breast cancer s/p lumpectomy 2022, on Aromatase inhibitor.      Problem List: Patient Active Problem List   Diagnosis Date Noted   Abnormal finding present on diagnostic imaging of uterus 03/18/2024   Genetic testing 05/09/2021   Family history of breast cancer 04/18/2021   Family history of pancreatic cancer 04/18/2021   Family history of colon cancer 04/18/2021   Family history of melanoma 04/18/2021   Family history of CLL (chronic lymphoid leukemia) 04/18/2021   Carcinoma of upper-inner quadrant of right breast in female, estrogen receptor positive (HCC) 04/18/2021   Prediabetes 08/02/2017   Postmenopausal bleeding 08/02/2017   Primary osteoarthritis of both hands 02/27/2016   Osteoarthritis of spine with radiculopathy, cervical region 02/27/2016   Pure hypercholesterolemia 02/27/2016    Past Medical History: Past Medical History:  Diagnosis Date   Arthritis    Bilateral carpal tunnel syndrome    Breast cancer (HCC)    right 2022   Family history of breast cancer    Family history of CLL (chronic lymphoid leukemia)    Family history of colon cancer    Family history of melanoma    Family history of pancreatic cancer    Heart murmur    History of hiatal hernia    Iron  deficiency anemia    Irritable bowel syndrome  with diarrhea    Malignant neoplasm of upper-inner quadrant of right female breast, unspecified estrogen receptor status (HCC) 2022   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 Excised: 04/21/2010   Osteoarthritis of spine with radiculopathy, cervical region    Personal history of radiation therapy    Post-menopausal bleeding    Pre-diabetes     Pure hypercholesterolemia    Wears contact lenses     Past Surgical History: Past Surgical History:  Procedure Laterality Date   BIOPSY  11/11/2023   Procedure: BIOPSY;  Surgeon: Shane Darling, MD;  Location: [facility];  Service: Endoscopy;;   BREAST BIOPSY Right 1987, 2013   fibroadenoma x 2   BREAST BIOPSY Right 04/06/2021   u/s bx-venus clip-positive   BREAST EXCISIONAL BIOPSY Left 1978   BREAST FIBROADENOMA SURGERY     BREAST LUMPECTOMY WITH SENTINEL LYMPH NODE BIOPSY Right 05/08/2021   Procedure: BREAST LUMPECTOMY WITH SENTINEL LYMPH NODE BX;  Surgeon: Marshall Skeeter, MD;  Location: [facility] OR[facility];  Service: General;  Laterality: Right;   CARPAL TUNNEL RELEASE Right 08/28/2017   Procedure: CARPAL TUNNEL RELEASE ENDOSCOPIC;  Surgeon: Elner Hahn, MD;  Location: [facility];  Service: Orthopedics;  Laterality: Right;   CARPAL TUNNEL RELEASE Left 11/06/2017   Procedure: CARPAL TUNNEL RELEASE ENDOSCOPIC;  Surgeon: Elner Hahn, MD;  Location: [facility];  Service: Orthopedics;  Laterality: Left;   COLONOSCOPY N/A 09/26/2015   Procedure: COLONOSCOPY;  Surgeon: Cassie Click, MD;  Location: [facility];  Service: Endoscopy;  Laterality: N/A;   COLONOSCOPY WITH PROPOFOL  N/A 04/16/2022   Procedure: COLONOSCOPY WITH PROPOFOL ;  Surgeon: Shane Darling, MD;  Location: [facility];  Service: Endoscopy;  Laterality: N/A;   COLONOSCOPY WITH PROPOFOL  N/A 11/11/2023   Procedure: COLONOSCOPY WITH PROPOFOL ;  Surgeon: Shane Darling, MD;  Location: [facility];  Service: Endoscopy;  Laterality: N/A;   ESOPHAGOGASTRODUODENOSCOPY (EGD) WITH PROPOFOL  N/A 11/11/2023   Procedure: ESOPHAGOGASTRODUODENOSCOPY (EGD) WITH PROPOFOL ;  Surgeon: Shane Darling, MD;  Location: [facility];  Service: Endoscopy;  Laterality: N/A;   HYSTEROSCOPY WITH D & C N/A 02/13/2024   Procedure: DILATATION AND CURETTAGE /HYSTEROSCOPY;  Surgeon: [name], MD;   Location: [facility] ORS;  Service: Gynecology;  Laterality: N/A;   TOOTH EXTRACTION      Past Gynecologic History:  Menarche: age 62 Post menopausal Sexually active.  Hx of abnormal pap smear & cryosurgery in 1996. Records not available.   OB History:  OB History  Gravida Para Term Preterm AB Living  0 0 0 0 0 0  SAB IAB Ectopic Multiple Live Births  0 0 0 0 0    Family History: Family History  Problem Relation Age of Onset   Colon cancer Mother        dx 76s   Other Brother        CLL and skin cancers   Breast cancer Maternal Aunt 74   Lung cancer Maternal Aunt    Breast cancer Paternal Aunt 97   Pancreatic cancer Paternal Aunt    Melanoma Maternal Uncle    Breast cancer Niece        dx 30s, BRCA pos    Social History: Social History   Socioeconomic History   Marital status: Single    Spouse name: Not on file   Number of children: 0   Years of education: Not on file   Highest education level: Not on  file  Occupational History   Not on file  Tobacco Use   Smoking status: Never   Smokeless tobacco: Never  Vaping Use   Vaping status: Never Used  Substance and Sexual Activity   Alcohol use: Yes    Alcohol/week: 0.0 standard drinks of alcohol    Comment: may have a drink 3-4x/yr   Drug use: No   Sexual activity: Yes    Birth control/protection: Post-menopausal  Other Topics Concern   Not on file  Social History Narrative   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    Social Drivers of Health   Financial Resource Strain: Low Risk  (10/10/2023)   Received from [facility]   Overall Financial Resource Strain (CARDIA)    Difficulty of Paying Living Expenses: Not hard at all  Food Insecurity: No Food Insecurity (03/18/2024)   Hunger Vital Sign    Worried About Running Out of Food in the Last Year: Never true    Ran Out of Food in the Last Year: Never true  Transportation Needs: No Transportation Needs (03/18/2024)   PRAPARE - Therapist, art (Medical): No    Lack of Transportation (Non-Medical): No  Physical Activity: Not on file  Stress: Not on file  Social Connections: Not on file  Intimate Partner Violence: Not At Risk (03/18/2024)   Humiliation, Afraid, Rape, and Kick questionnaire    Fear of Current or Ex-Partner: No    Emotionally Abused: No    Physically Abused: No    Sexually Abused: No    Allergies: Allergies  Allergen Reactions   Wound Dressing Adhesive Itching    Sensitive to Steri strips and Band aids adhesive   Meloxicam Itching    Current Medications: Current Outpatient Medications  Medication Sig Dispense Refill   anastrozole  (ARIMIDEX ) 1 MG tablet TAKE 1 TABLET BY MOUTH DAILY (Patient taking differently: Take 1 mg by mouth at bedtime.) 90 tablet 1   Biotin 2500 MCG CHEW Chew 2,500 mcg by mouth 2 (two) times daily.     Calcium Carb-Cholecalciferol (CALCIUM 600 + D PO) Take 1 tablet by mouth 2 (two) times daily.     Coenzyme Q10 (COQ10) 100 MG CAPS Take 100 mg by mouth daily.     Collagen-Boron-Hyaluronic Acid (MOVE FREE ULTRA JOINT HEALTH PO) Take 1 tablet by mouth daily.     Cyanocobalamin  (B-12) 5000 MCG SUBL Place 5,000 mcg under the tongue daily.     DM-Phenylephrine -Acetaminophen  10-5-325 MG TABS Take 2 tablets by mouth every 4 (four) hours as needed (congestion).     Fe Bisgly-Vit C-Vit B12-FA (GENTLE IRON ) 28-60-0.008-0.4 MG CAPS Take 1 tablet by mouth daily.     fexofenadine (ALLEGRA) 180 MG tablet Take 180 mg by mouth daily.     KRILL OIL OMEGA-3 PO Take 1 capsule by mouth daily.     Magnesium Chloride-Calcium (SLOW MAGNESIUM/CALCIUM PO) Take 1 tablet by mouth 2 (two) times daily.     Multiple Vitamin (MULTIVITAMIN) capsule Take 1 capsule by mouth daily. Centrum Silver     Multiple Vitamins-Minerals (AIRBORNE PO) Take 3 capsules by mouth daily as needed (immune support).     Pamabrom 50 MG CAPS Take 50 mg by mouth daily as needed (ankle swelling).     Polyvinyl  Alcohol-Povidone (CLEAR EYES ALL SEASONS) 5-6 MG/ML SOLN Place 1 drop into both eyes daily. May use a second time as needed for dryness     rosuvastatin (CRESTOR) 5 MG tablet Take 5  mg by mouth at bedtime.     Turmeric 500 MG CAPS Take 500 mg by mouth 2 (two) times daily.     No current facility-administered medications for this visit.   Review of Systems General: no complaints  HEENT: no complaints  Lungs: no complaints  Cardiac: no complaints  GI: no complaints  GU: no complaints  GYN: no further bleeding  Musculoskeletal: no complaints  Extremities: no complaints  Skin: no complaints  Neuro: no complaints  Endocrine: no complaints  Psych: no complaints        Objective:  Physical Examination:  BP (!) 143/85 (BP Location: Left Arm, Patient Position: Sitting)   Pulse 73   Resp 18   Ht 5' 3.5 (1.613 m)   Wt 184 lb (83.5 kg)   SpO2 98%   BMI 32.08 kg/m     Deferred  Lab Review No labs on site  Radiologic Imaging: 03/24/2024 CLINICAL DATA:  Initial treatment strategy for postmenopausal bleeding. Severe cervical stenosis. Remote history of right-sided breast cancer as well with radiation.   EXAM: NUCLEAR MEDICINE PET SKULL BASE TO THIGH   TECHNIQUE: 10.02 mCi F-18 FDG was injected intravenously. Full-ring PET imaging was performed from the skull base to thigh after the radiotracer. CT data was obtained and used for attenuation correction and anatomic localization.   Fasting blood glucose: 112 mg/dl   COMPARISON:  None Available.   FINDINGS: Mediastinal blood pool activity: SUV max 3.1   Liver activity: SUV max 3.2   NECK: No specific abnormal uptake in the neck including along lymph node change of the submandibular, posterior triangle or internal jugular regions. Near symmetric uptake of the intracranial compartment.   Incidental CT findings: The parotid glands, submandibular glands and thyroid glands preserved. Streak artifact related to the  patient's dental hardware. Paranasal sinuses and mastoid air cells are clear.   CHEST: Mild uptake along the large hiatal hernia. Otherwise no specific abnormal uptake seen above blood pool in the axillary regions, hilum or mediastinum. No abnormal lung uptake.   Incidental CT findings: Heart is not enlarged. No pericardial effusion. The thoracic aorta is normal course and caliber. Mild scattered calcified plaque. Breathing motion. Mild linear opacity lung bases likely scar or atelectasis. No consolidation, pneumothorax or effusion.   ABDOMEN/PELVIS: There is physiologic distribution radiotracer along the parenchymal organs, bowel and renal collecting systems. No abnormal uptake in the area of the uterus this time.   Incidental CT findings: Grossly, the liver, spleen, adrenal glands, pancreas are preserved. Gallbladder is nondilated. No abnormal calcification seen within either kidney nor along the course of either ureter. Underdistended urinary bladder. Uterus is present. No separate adnexal mass. Large bowel has a normal course and caliber with scattered colonic stool. Appendix is poorly seen. Stomach and small bowel are nondilated. Once again there is a large hiatal hernia. No obvious free air or free fluid.   SKELETON: No specific abnormal uptake along the visualized osseous structures.   Incidental CT findings: Curvature of the spine. Multifocal degenerative changes seen. Trace retrolisthesis at the upper lumbar spine. Areas of canal encroachment.   IMPRESSION: No specific areas of abnormal radiotracer uptake. This includes along the uterus. Please correlate with known history and dedicated further evaluation such as ultrasound or MRI as clinically appropriate.   Large hiatal hernia. Mild uptake along herniated stomach, nonspecific        Assessment:  Meghan Harper is a 68 y.o. female diagnosed with postmenopausal bleeding, abnormal uterine findings on imaging  with  thickened endometrial stripe, severe cervical stenosis that precludes the ability to sample the uterus.  Negative PET scan  S/p cryotherapy  Medical co-morbidities complicating care: h/o breast cancer, h/o melanoma in situ, pre-diabetes, Body mass index is 32.08 kg/m.  Plan:   Problem List Items Addressed This Visit   None Visit Diagnoses       Thickened endometrium    -  Primary   Relevant Orders   US  PELVIC COMPLETE WITH TRANSVAGINAL      We reviewed the negative PET scan results which are really reassuring.  I recommended continued surveillance with repeat imaging with transvaginal ultrasound to assess the endometrial stripe in 3 months.  If she has any concerning new symptoms another episode of vaginal bleeding pelvic pain or fullness or any other concerns she will contact us .  Suggested return to clinic in 3 months.  The patient's diagnosis, an outline of the further diagnostic and laboratory studies which will be required, the recommendation for surgery, and alternatives were discussed with her and her accompanying family members.  All questions were answered to their satisfaction.  A total of 15 minutes were spent with the patient/family today;>50% was spent in education, counseling and coordination of care.  Time spent included review of the imaging studies, discussion of the radiology report with the patient and recommendations for management.   Roniel Halloran Iola [place], MD      CC:  [name], MD [address] [place],  [place] [address] [phone]

## 2024-04-15 ENCOUNTER — Inpatient Hospital Stay: Payer: Federal, State, Local not specified - PPO

## 2024-04-15 ENCOUNTER — Inpatient Hospital Stay (HOSPITAL_BASED_OUTPATIENT_CLINIC_OR_DEPARTMENT_OTHER): Payer: Federal, State, Local not specified - PPO | Admitting: Internal Medicine

## 2024-04-15 ENCOUNTER — Encounter: Payer: Self-pay | Admitting: Internal Medicine

## 2024-04-15 DIAGNOSIS — Z170 Estrogen receptor positive status [ER+]: Secondary | ICD-10-CM

## 2024-04-15 DIAGNOSIS — C50211 Malignant neoplasm of upper-inner quadrant of right female breast: Secondary | ICD-10-CM | POA: Diagnosis not present

## 2024-04-15 DIAGNOSIS — R9389 Abnormal findings on diagnostic imaging of other specified body structures: Secondary | ICD-10-CM | POA: Diagnosis not present

## 2024-04-15 LAB — CMP (CANCER CENTER ONLY)
ALT: 22 U/L (ref 0–44)
AST: 24 U/L (ref 15–41)
Albumin: 4.2 g/dL (ref 3.5–5.0)
Alkaline Phosphatase: 55 U/L (ref 38–126)
Anion gap: 6 (ref 5–15)
BUN: 17 mg/dL (ref 8–23)
CO2: 27 mmol/L (ref 22–32)
Calcium: 9.1 mg/dL (ref 8.9–10.3)
Chloride: 100 mmol/L (ref 98–111)
Creatinine: 0.71 mg/dL (ref 0.44–1.00)
GFR, Estimated: >60 mL/min (ref >60)
Glucose, Bld: 113 mg/dL — ABNORMAL HIGH (ref 70–99)
Potassium: 3.9 mmol/L (ref 3.5–5.1)
Sodium: 133 mmol/L — ABNORMAL LOW (ref 135–145)
Total Bilirubin: 0.7 mg/dL (ref 0.0–1.2)
Total Protein: 6.7 g/dL (ref 6.5–8.1)

## 2024-04-15 LAB — CBC WITH DIFFERENTIAL (CANCER CENTER ONLY)
Abs Immature Granulocytes: 0.01 10*3/uL (ref 0.00–0.07)
Basophils Absolute: 0.1 10*3/uL (ref 0.0–0.1)
Basophils Relative: 1 %
Eosinophils Absolute: 0.1 10*3/uL (ref 0.0–0.5)
Eosinophils Relative: 2 %
HCT: 40.3 % (ref 36.0–46.0)
Hemoglobin: 13.5 g/dL (ref 12.0–15.0)
Immature Granulocytes: 0 %
Lymphocytes Relative: 21 %
Lymphs Abs: 1.3 10*3/uL (ref 0.7–4.0)
MCH: 29.2 pg (ref 26.0–34.0)
MCHC: 33.5 g/dL (ref 30.0–36.0)
MCV: 87.2 fL (ref 80.0–100.0)
Monocytes Absolute: 0.5 10*3/uL (ref 0.1–1.0)
Monocytes Relative: 9 %
Neutro Abs: 4 10*3/uL (ref 1.7–7.7)
Neutrophils Relative %: 67 %
Platelet Count: 213 10*3/uL (ref 150–400)
RBC: 4.62 MIL/uL (ref 3.87–5.11)
RDW: 11.8 % (ref 11.5–15.5)
WBC Count: 6 10*3/uL (ref 4.0–10.5)
nRBC: 0 % (ref 0.0–0.2)

## 2024-04-15 LAB — IRON AND TIBC
Iron: 179 ug/dL — ABNORMAL HIGH (ref 28–170)
Saturation Ratios: 44 % — ABNORMAL HIGH (ref 10.4–31.8)
TIBC: 409 ug/dL (ref 250–450)
UIBC: 230 ug/dL

## 2024-04-15 LAB — FERRITIN: Ferritin: 10 ng/mL — ABNORMAL LOW (ref 11–307)

## 2024-04-15 NOTE — Assessment & Plan Note (Addendum)
#  Stage I T1c- ER positive; PR negative; her 2 negative; s/p lumpectomy clear margins [Dr,Byrnett/ Dr.Sam ]. ONCOTYPE-LOW RISK- s/p RT. June 2022. MAy 2025 [Dr.Cintron]- Bil Mamm-WNL; stable.   # ON anastrazole- tolerating well except for hot flashes- see below.  # Hot flashes grade 1-recommend monitor for now; stable.   # severe anemia- hb- 6.0 [OCT, 2024]- sec to severe  iron  studies; ferritin- colonoscopy- negative-EGD- Hiatal hernia- ? etiology. Discussed with [Dr.Locklear];Today hemoglobin is 13.2.  continue gentle iron - Hold off Venofer .  # Endometrial thickening [Dr.Beasley/ Dr.Secord]-Difficult to Biopsy.  PET 2025- NEG- continue surveillance US  in 6 months.   # BMD- OCT 2024- [Dr.Moriyati; [facility]]-The BMD measured at Femur Neck Right is 0.940 g/cm2 with a T-score of -0.7.  Continue Calcium plus vitamin D ; weightbearing exercises.   stable  q6M # DISPOSITION: # HOLD venofer  today # Follow up in mid JAN 2025-- MD; cbc/cmp; iron  studies; ferritin- possible venofer -- -Dr.B

## 2024-04-15 NOTE — Progress Notes (Signed)
 [facility] CONSULT NOTE  Patient Care Team: Marikay Eva POUR, [name] as PCP - General (Physician Assistant) Dannielle Arlean FALCON, RN (Inactive) as Oncology Nurse Navigator Lenn Aran, MD as Consulting Physician (Radiation Oncology) Rennie Cindy SAUNDERS, MD as Consulting Physician (Internal Medicine) Dessa Reyes ORN, MD as Consulting Physician (General Surgery)  CHIEF COMPLAINTS/PURPOSE OF CONSULTATION: Breast cancer  Oncology History Overview Note  # June 2022- Targeted ultrasound was performed of the RIGHT upper inner breast. At 1:30 8 cm from the nipple, there is an irregular hypoechoic mass with indistinct margins. It measures approximately 10 x 5 x 6 mm. This corresponds to the site of screening mammographic concern.   Targeted ultrasound was performed of the RIGHT axilla. No suspicious axillary lymph nodes are seen.   IMPRESSION: 1. There is a 10 mm mass in the RIGHT upper inner breast which is concerning for malignancy. Recommend ultrasound-guided biopsy for definitive characterization.  DIAGNOSIS:  A. BREAST, RIGHT, 1:30 O'CLOCK 8 CM FROM NIPPLE; ULTRASOUND-GUIDED CORE  BIOPSY:  - INVASIVE MAMMARY CARCINOMA, NO SPECIAL TYPE.   Size of invasive carcinoma: 7 mm in this sample  Histologic grade of invasive carcinoma: Grade 2                       Glandular/tubular differentiation score: 3                       Nuclear pleomorphism score: 2                       Mitotic rate score: 1                       Total score: 6  Ductal carcinoma in situ: Present, intermediate grade  Lymphovascular invasion: Not identified   CASE SUMMARY: BREAST BIOMARKER TESTS  Estrogen Receptor (ER) Status: POSITIVE          Percentage of cells with nuclear positivity: Greater than 90%          Average intensity of staining: Strong   Progesterone Receptor (PgR) Status: NEGATIVE (less than 1%)          Internal control cells present and stain as expected   HER2 (by  immunohistochemistry): NEGATIVE (Score 0)  TNM Descriptors: Not applicable  pT1c  Regional Lymph Nodes Modifier: Not applicable  pN0  pM - Not applicable  #  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   #Right breast cancer status postlumpectomy-Oncotype low risk; no chemotherapy.  # NOV 14th, 2022-start anastrozole  for 5 years.   Carcinoma of upper-inner quadrant of right breast in female, estrogen receptor positive (HCC)  04/18/2021 Initial Diagnosis   Carcinoma of upper-inner quadrant of right breast in female, estrogen receptor positive (HCC)    Genetic Testing   Negative genetic testing. No pathogenic variants identified on the [employer] CancerNext-Expanded+RNA Panel. The report date is 04/30/2021.   The CancerNext-Expanded + RNAinsight gene panel offered by [employer] and includes sequencing and rearrangement analysis for the following 77 genes: IP, ALK, APC*, ATM*, AXIN2, BAP1, BARD1, BLM, BMPR1A, BRCA1*, BRCA2*, BRIP1*, CDC73, CDH1*,CDK4, CDKN1B, CDKN2A, CHEK2*, CTNNA1, DICER1, FANCC, FH, FLCN, GALNT12, KIF1B, LZTR1, MAX, MEN1, MET, MLH1*, MSH2*, MSH3, MSH6*, MUTYH*, NBN, NF1*, NF2, NTHL1, PALB2*, PHOX2B, PMS2*, POT1, PRKAR1A, PTCH1, PTEN*, RAD51C*, RAD51D*,RB1, RECQL, RET, SDHA, SDHAF2, SDHB, SDHC, SDHD, SMAD4, SMARCA4, SMARCB1, SMARCE1, STK11, SUFU, TMEM127, TP53*,TSC1, TSC2, VHL and XRCC2 (sequencing and deletion/duplication); EGFR, EGLN1,  HOXB13, KIT, MITF, PDGFRA, POLD1 and POLE (sequencing only); EPCAM and GREM1 (deletion/duplication only).   05/25/2021 Cancer Staging   Staging form: Breast, AJCC 8th Edition - Pathologic: Stage IA (pT1c, pN0, cM0, G2, ER+, PR-, HER2-) - Signed by Rennie Cindy SAUNDERS, MD on 05/25/2021 Histologic grading system: 3 grade system     HISTORY OF PRESENTING ILLNESS: Ambulating independently.  Alone.  Meghan Harper 68 y.o.  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 Hiatal hernia on oral iron  is here for a follow-up.  In the interim evaluated by gynecology oncology-no concerns for endometrial cancer.    Continues to complain of anastrozole . Denies any blood in stools or black-colored stool.  Patient denies any worsening hot flashes or joint pains.  Denies any nausea vomiting.   Review of Systems  Constitutional:  Positive for malaise/fatigue. Negative for chills, diaphoresis and fever.  HENT:  Negative for nosebleeds and sore throat.   Eyes:  Negative for double vision.  Respiratory:  Positive for shortness of breath. Negative for hemoptysis, sputum production and wheezing.   Cardiovascular:  Negative for chest pain, palpitations, orthopnea and leg swelling.  Gastrointestinal:  Negative for blood in stool, constipation, diarrhea, heartburn, melena and vomiting.  Genitourinary:  Negative for dysuria, frequency and urgency.  Musculoskeletal:  Positive for joint pain. Negative for back pain.  Skin: Negative.  Negative for itching and rash.  Neurological:  Negative for dizziness, tingling, focal weakness, weakness and headaches.  Endo/Heme/Allergies:  Does not bruise/bleed easily.  Psychiatric/Behavioral:  Negative for depression. The patient is not nervous/anxious and does not have insomnia.      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  Excised: 04/21/2010   Osteoarthritis of spine with radiculopathy, cervical region    Personal history of radiation therapy    Post-menopausal bleeding    Pre-diabetes    Pure hypercholesterolemia    Wears contact lenses     SURGICAL HISTORY: Past Surgical History:  Procedure Laterality Date   BIOPSY  11/11/2023   Procedure: BIOPSY;  Surgeon: Maryruth Ole DASEN, MD;  Location: [facility];  Service: Endoscopy;;   BREAST BIOPSY Right 1987, 2013   fibroadenoma x 2   BREAST BIOPSY Right 04/06/2021   u/s bx-venus clip-positive   BREAST EXCISIONAL BIOPSY Left 1978   BREAST FIBROADENOMA SURGERY     BREAST LUMPECTOMY WITH SENTINEL LYMPH NODE BIOPSY Right 05/08/2021   Procedure: BREAST LUMPECTOMY WITH SENTINEL LYMPH NODE BX;  Surgeon: Dessa Reyes ORN, MD;  Location: [facility];  Service: General;  Laterality: Right;   CARPAL TUNNEL RELEASE Right 08/28/2017   Procedure: CARPAL TUNNEL RELEASE ENDOSCOPIC;  Surgeon: Edie Norleen PARAS, MD;  Location: [facility];  Service: Orthopedics;  Laterality: Right;   CARPAL TUNNEL RELEASE Left 11/06/2017   Procedure: CARPAL TUNNEL RELEASE ENDOSCOPIC;  Surgeon: Edie Norleen PARAS, MD;  Location: [facility];  Service: Orthopedics;  Laterality: Left;   COLONOSCOPY N/A 09/26/2015   Procedure: COLONOSCOPY;  Surgeon: Lamar ONEIDA Holmes, MD;  Location: [facility];  Service: Endoscopy;  Laterality: N/A;   COLONOSCOPY WITH PROPOFOL  N/A 04/16/2022   Procedure: COLONOSCOPY WITH PROPOFOL ;  Surgeon: Maryruth Ole ONEIDA, MD;  Location: [facility];  Service: Endoscopy;  Laterality: N/A;   COLONOSCOPY WITH PROPOFOL  N/A 11/11/2023   Procedure: COLONOSCOPY WITH PROPOFOL ;  Surgeon: Maryruth Ole ONEIDA, MD;  Location: [facility];  Service: Endoscopy;  Laterality: N/A;   ESOPHAGOGASTRODUODENOSCOPY (EGD) WITH PROPOFOL  N/A 11/11/2023   Procedure: ESOPHAGOGASTRODUODENOSCOPY (EGD) WITH PROPOFOL ;  Surgeon: Maryruth Ole ONEIDA, MD;  Location: [facility];   Service: Endoscopy;  Laterality: N/A;   HYSTEROSCOPY WITH D & C N/A 02/13/2024   Procedure: DILATATION AND CURETTAGE /HYSTEROSCOPY;  Surgeon: Verdon Keen, MD;  Location: [facility];  Service: Gynecology;  Laterality: N/A;   TOOTH EXTRACTION      SOCIAL HISTORY: Social History   Socioeconomic History   Marital status: Single    Spouse name: Not on file   Number of children: 0   Years of education: Not on file   Highest education level: Not on file  Occupational History   Not on file  Tobacco Use   Smoking status: Never   Smokeless tobacco: Never  Vaping Use   Vaping status: Never Used  Substance and Sexual Activity   Alcohol use: Yes    Alcohol/week: 0.0 standard drinks of alcohol    Comment: may have a drink 3-4x/yr   Drug use: No   Sexual activity: Yes    Birth control/protection: Post-menopausal  Other Topics Concern   Not on file  Social History Narrative   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    Social Drivers of Health   Financial Resource Strain: Low Risk  (04/14/2024)   Received from [facility]   Overall Financial Resource Strain (CARDIA)    Difficulty of Paying Living Expenses: Not hard at all  Food Insecurity: No Food Insecurity (04/14/2024)   Received from [name]   Hunger Vital Sign    Within the past 12 months, you worried that your food would run out before you got the money to buy more.: Never true    Within the past 12 months, the food you bought just didn't last and you didn't have money to get more.: Never true  Transportation Needs: No Transportation Needs (04/14/2024)   Received from [facility] - Transportation    In the past 12 months, has lack of transportation kept you from medical appointments or from getting medications?: No    Lack of Transportation (Non-Medical): No  Physical Activity: Not on file  Stress: Not on file  Social Connections: Not on file  Intimate  Partner Violence: Not At Risk (03/18/2024)   Humiliation, Afraid, Rape, and Kick questionnaire    Fear of Current or Ex-Partner: No    Emotionally Abused: No    Physically Abused: No    Sexually Abused: No    FAMILY HISTORY: Family History  Problem Relation Age of Onset   Colon cancer Mother        dx 61s   Other Brother        CLL and skin cancers   Breast cancer  Maternal Aunt 60   Lung cancer Maternal Aunt    Breast cancer Paternal Aunt 75   Pancreatic cancer Paternal Aunt    Melanoma Maternal Uncle    Breast cancer Niece        dx 30s, BRCA pos    ALLERGIES:  is allergic to wound dressing adhesive and meloxicam.  MEDICATIONS:  Current Outpatient Medications  Medication Sig Dispense Refill   anastrozole  (ARIMIDEX ) 1 MG tablet TAKE 1 TABLET BY MOUTH DAILY (Patient taking differently: Take 1 mg by mouth at bedtime.) 90 tablet 1   Calcium Carb-Cholecalciferol (CALCIUM 600 + D PO) Take 1 tablet by mouth 2 (two) times daily.     Coenzyme Q10 (COQ10) 100 MG CAPS Take 100 mg by mouth daily.     Collagen-Boron-Hyaluronic Acid (MOVE FREE ULTRA JOINT HEALTH PO) Take 1 tablet by mouth daily.     Cyanocobalamin  (B-12) 5000 MCG SUBL Place 5,000 mcg under the tongue daily.     DM-Phenylephrine -Acetaminophen  10-5-325 MG TABS Take 2 tablets by mouth every 4 (four) hours as needed (congestion).     Fe Bisgly-Vit C-Vit B12-FA (GENTLE IRON ) 28-60-0.008-0.4 MG CAPS Take 1 tablet by mouth daily.     fexofenadine (ALLEGRA) 180 MG tablet Take 180 mg by mouth daily.     KRILL OIL OMEGA-3 PO Take 1 capsule by mouth daily.     Magnesium Chloride-Calcium (SLOW MAGNESIUM/CALCIUM PO) Take 1 tablet by mouth 2 (two) times daily.     Multiple Vitamin (MULTIVITAMIN) capsule Take 1 capsule by mouth daily. Centrum Silver     Multiple Vitamins-Minerals (AIRBORNE PO) Take 3 capsules by mouth daily as needed (immune support).     Multiple Vitamins-Minerals (HAIR SKIN AND NAILS FORMULA PO) Take 1 tablet by  mouth 2 (two) times daily.     Pamabrom 50 MG CAPS Take 50 mg by mouth daily as needed (ankle swelling).     Polyvinyl Alcohol-Povidone (CLEAR EYES ALL SEASONS) 5-6 MG/ML SOLN Place 1 drop into both eyes daily. May use a second time as needed for dryness     rosuvastatin (CRESTOR) 5 MG tablet Take 5 mg by mouth at bedtime.     Turmeric 500 MG CAPS Take 500 mg by mouth 2 (two) times daily.     Biotin 2500 MCG CHEW Chew 2,500 mcg by mouth 2 (two) times daily. (Patient not taking: Reported on 04/15/2024)     No current facility-administered medications for this visit.      SABRA  PHYSICAL EXAMINATION: ECOG PERFORMANCE STATUS: 0 - Asymptomatic  Vitals:   04/15/24 1400  BP: (!) 115/59  Pulse: 73  Resp: 18  Temp: 98.7 F (37.1 C)  SpO2: 99%     Filed Weights   04/15/24 1400  Weight: 183 lb 14.4 oz (83.4 kg)      Physical Exam Vitals and nursing note reviewed.  HENT:     Head: Normocephalic and atraumatic.     Mouth/Throat:     Pharynx: Oropharynx is clear.   Eyes:     Extraocular Movements: Extraocular movements intact.     Pupils: Pupils are equal, round, and reactive to light.    Cardiovascular:     Rate and Rhythm: Normal rate and regular rhythm.  Pulmonary:     Comments: Decreased breath sounds bilaterally.  Abdominal:     Palpations: Abdomen is soft.   Musculoskeletal:        General: Normal range of motion.     Cervical back: Normal range of motion.  Skin:    General: Skin is warm.   Neurological:     General: No focal deficit present.     Mental Status: She is alert and oriented to person, place, and time.   Psychiatric:        Behavior: Behavior normal.        Judgment: Judgment normal.      LABORATORY DATA:  I have reviewed the data as listed Lab Results  Component Value Date   WBC 6.0 04/15/2024   HGB 13.5 04/15/2024   HCT 40.3 04/15/2024   MCV 87.2 04/15/2024   PLT 213 04/15/2024   Recent Labs    09/16/23 1243 12/17/23 1410  04/15/24 1349  NA 141 137 133*  K 4.2 4.4 3.9  CL 103 103 100  CO2 26 26 27   GLUCOSE 122* 113* 113*  BUN 18 18 17   CREATININE 0.53 0.53 0.71  CALCIUM 9.3 9.0 9.1  GFRNONAA >60 >60 >60  PROT 6.8 6.8 6.7  ALBUMIN 4.2 4.1 4.2  AST 27 25 24   ALT 23 28 22   ALKPHOS 47 48 55  BILITOT 0.4 0.5 0.7     RADIOGRAPHIC STUDIES: I have personally reviewed the radiological images as listed and agreed with the findings in the report. NM PET Image Initial (PI) Skull Base To Thigh Result Date: 03/31/2024 CLINICAL DATA:  Initial treatment strategy for postmenopausal bleeding. Severe cervical stenosis. Remote history of right-sided breast cancer as well with radiation. EXAM: NUCLEAR MEDICINE PET SKULL BASE TO THIGH TECHNIQUE: 10.02 mCi F-18 FDG was injected intravenously. Full-ring PET imaging was performed from the skull base to thigh after the radiotracer. CT data was obtained and used for attenuation correction and anatomic localization. Fasting blood glucose: 112 mg/dl COMPARISON:  None Available. FINDINGS: Mediastinal blood pool activity: SUV max 3.1 Liver activity: SUV max 3.2 NECK: No specific abnormal uptake in the neck including along lymph node change of the submandibular, posterior triangle or internal jugular regions. Near symmetric uptake of the intracranial compartment. Incidental CT findings: The parotid glands, submandibular glands and thyroid glands preserved. Streak artifact related to the patient's dental hardware. Paranasal sinuses and mastoid air cells are clear. CHEST: Mild uptake along the large hiatal hernia. Otherwise no specific abnormal uptake seen above blood pool in the axillary regions, hilum or mediastinum. No abnormal lung uptake. Incidental CT findings: Heart is not enlarged. No pericardial effusion. The thoracic aorta is normal course and caliber. Mild scattered calcified plaque. Breathing motion. Mild linear opacity lung bases likely scar or atelectasis. No consolidation,  pneumothorax or effusion. ABDOMEN/PELVIS: There is physiologic distribution radiotracer along the parenchymal organs, bowel and renal collecting systems. No abnormal uptake in the area of the uterus this time. Incidental CT findings: Grossly, the liver, spleen, adrenal glands, pancreas are preserved. Gallbladder is nondilated. No abnormal calcification seen within either kidney nor along the course of either ureter. Underdistended urinary bladder. Uterus is present. No separate adnexal mass. Large bowel has a normal course and caliber with scattered colonic stool. Appendix is poorly seen. Stomach and small bowel are nondilated. Once again there is a large hiatal hernia. No obvious free air or free fluid. SKELETON: No specific abnormal uptake along the visualized osseous structures. Incidental CT findings: Curvature of the spine. Multifocal degenerative changes seen. Trace retrolisthesis at the upper lumbar spine. Areas of canal encroachment. IMPRESSION: No specific areas of abnormal radiotracer uptake. This includes along the uterus. Please correlate with known history and dedicated further evaluation such as ultrasound or MRI  as clinically appropriate. Large hiatal hernia. Mild uptake along herniated stomach, nonspecific Electronically Signed   By: Ranell Bring M.D.   On: 03/31/2024 18:26    ASSESSMENT & PLAN:   Carcinoma of upper-inner quadrant of right breast in female, estrogen receptor positive (HCC) #Stage I T1c- ER positive; PR negative; her 2 negative; s/p lumpectomy clear margins [Dr,Byrnett/ Dr.Sam ]. ONCOTYPE-LOW RISK- s/p RT. June 2022. MAy 2025 [Dr.Cintron]- Bil Mamm-WNL; stable.   # ON anastrazole- tolerating well except for hot flashes- see below.  # Hot flashes grade 1-recommend monitor for now; stable.   # severe anemia- hb- 6.0 [OCT, 2024]- sec to severe  iron  studies; ferritin- colonoscopy- negative-EGD- Hiatal hernia- ? etiology. Discussed with [Dr.Locklear];Today hemoglobin is 13.2.   continue gentle iron - Hold off Venofer .  # Endometrial thickening [Dr.Beasley/ Dr.Secord]-Difficult to Biopsy.  PET 2025- NEG- continue surveillance US  in 6 months.   # BMD- OCT 2024- [Dr.Moriyati; [facility]]-The BMD measured at Femur Neck Right is 0.940 g/cm2 with a T-score of -0.7.  Continue Calcium plus vitamin D ; weightbearing exercises.   stable  q6M # DISPOSITION: # HOLD venofer  today # Follow up in mid JAN 2025-- MD; cbc/cmp; iron  studies; ferritin- possible venofer -- -Dr.B   All questions were answered. The patient/family knows to call the clinic with any problems, questions or concerns.    Cindy JONELLE Joe, MD 04/15/2024 2:48 PM

## 2024-04-15 NOTE — Progress Notes (Signed)
 Fatigue/weakness: YES-HEAT Dyspena: NO Light headedness:NO Blood in stool: NO   She had a [facility] 01/2024, Dr. Verdon.  PET 03/24/24.

## 2024-06-29 ENCOUNTER — Ambulatory Visit
Admission: RE | Admit: 2024-06-29 | Discharge: 2024-06-29 | Disposition: A | Discharge status: Home / Self Care | Source: Ambulatory Visit | Attending: Obstetrics and Gynecology | Admitting: Obstetrics and Gynecology

## 2024-06-29 DIAGNOSIS — R9389 Abnormal findings on diagnostic imaging of other specified body structures: Secondary | ICD-10-CM | POA: Diagnosis present

## 2024-07-15 ENCOUNTER — Encounter: Payer: Self-pay | Admitting: Obstetrics and Gynecology

## 2024-07-15 ENCOUNTER — Inpatient Hospital Stay: Attending: Obstetrics and Gynecology | Admitting: Obstetrics and Gynecology

## 2024-07-15 VITALS — BP 126/73 | HR 72 | Temp 97.8°F | Resp 16 | Wt 185.9 lb

## 2024-07-15 DIAGNOSIS — Z86006 Personal history of melanoma in-situ: Secondary | ICD-10-CM | POA: Diagnosis not present

## 2024-07-15 DIAGNOSIS — N882 Stricture and stenosis of cervix uteri: Secondary | ICD-10-CM | POA: Insufficient documentation

## 2024-07-15 DIAGNOSIS — N950 Postmenopausal bleeding: Secondary | ICD-10-CM | POA: Diagnosis present

## 2024-07-15 DIAGNOSIS — Z853 Personal history of malignant neoplasm of breast: Secondary | ICD-10-CM | POA: Insufficient documentation

## 2024-07-15 DIAGNOSIS — R9389 Abnormal findings on diagnostic imaging of other specified body structures: Secondary | ICD-10-CM

## 2024-07-15 NOTE — Progress Notes (Signed)
 Gynecologic Oncology Interval Visit   Referring Provider:   Chief Complaint: PMB   Subjective:  Meghan Harper is a 68 y.o. GOPO female who is seen in consultation from Dr. 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  06/29/2024 US  Pelvis, Complete Transvaginal and Transabdominal without Doppler UTERUS: The uterus is anteverted. The uterus measures 5.8 x 4.2 x 4.2 cm with a volume of 53 cc. A solitary uterine mass is identified within the anterior fundus which is mildly heterogeneously hypoechoic and well circumscribed in keeping with an intramural/submucosal uterine fibroid. This mass measures 3.6 x 3.2 x 3.8 cm. This demonstrates mass effect upon the endometrium.   ENDOMETRIAL STRIPE: The endometrium appears uniform measuring up to 2 mm in thickness.   RIGHT OVARY: Not visualized.   LEFT OVARY: Not visualized.   FREE FLUID: A small amount of simple-appearing free fluid is seen within the endocervical canal. No free fluid within the pelvis.   IMPRESSION: 1. Intramural/submucosal uterine fibroid in the anterior fundus measuring 3.6 x 3.2 x 3.8 cm, causing mass effect on the endometrium. 2. Uniform endometrium measuring up to 2 mm in thickness.     Gynecologic Oncology History: Meghan Harper is a pleasant female who is seen in consultation from Dr. 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  Patient has history of abnormal pap in 1996 and underwent cryosurgery. Follow up paps have been negative/normal and negative for hpv. Last pap 10/24/21- NILM  She saw Dr Verdon in February 2025 for PMB, approximately 1 tablespoon of blood. Workup included thickened endometrium- 18.9 mm. Unable to dilate cervix for EMBx.   02/13/24- D&C/Hysteroscopy performed for thickened endometrial stripe and PMB  Op Note: An endocervical curette was performed to a depth of 3cm. Her cervix was serially  dilated to 15 [place] using Hanks dilators. The hysteroscope was introduced under direct observation to allow visualization of the cervical canal. Using lactated ringers  as a distention medium to see, the canal was unable to be clearly visualized, but a false passage was suspected. I continued with vaginoscopy without success at finding the cervical passage, but was concerned that the false passage might lead into the abdomen and therefore after a thorough view, abandoned looking.   1. Endocervix, curettage:       - FEW FRAGMENTS OF METAPLASTIC SQUAMOUS EPITHELIUM.       - NEGATIVE FOR DYSPLASIA AND MALIGNANCY.   Patient wished to avoid hysterectomy d/t concern of prolapse or bladder issues long term. 4 mo u/s for monitoring of ES was discussed vs gyn onc. She elected to be seen by gyn onc for second opinion.   We recommended a PET scan for further evaluation.  03/24/2024 PET Initial  No specific areas of abnormal radiotracer uptake.  No abnormal uptake in the area of the uterus this time. Large hiatal hernia. Mild uptake along herniated stomach, nonspecific   She desired continued close surveillance.   History:  She has a history of iron  deficiency anemia and breast cancer s/p lumpectomy 2022, on Aromatase inhibitor.      Problem List: Patient Active Problem List   Diagnosis Date Noted   Thickened endometrium 03/18/2024   Genetic testing 05/09/2021   Family history of breast cancer 04/18/2021   Family history of pancreatic cancer 04/18/2021   Family history of colon cancer 04/18/2021   Family history of melanoma 04/18/2021   Family history of CLL (chronic lymphoid leukemia)  04/18/2021   Carcinoma of upper-inner quadrant of right breast in female, estrogen receptor positive (HCC) 04/18/2021   Prediabetes 08/02/2017   Postmenopausal bleeding 08/02/2017   Primary osteoarthritis of both hands 02/27/2016   Osteoarthritis of spine with radiculopathy, cervical region 02/27/2016   Pure  hypercholesterolemia 02/27/2016    Past Medical History: Past Medical History:  Diagnosis Date   Arthritis    Bilateral carpal tunnel syndrome    Breast cancer (HCC)    right 2022   Family history of breast cancer    Family history of CLL (chronic lymphoid leukemia)    Family history of colon cancer    Family history of melanoma    Family history of pancreatic cancer    Heart murmur    History of hiatal hernia    Iron  deficiency anemia    Irritable bowel syndrome with diarrhea    Malignant neoplasm of upper-inner quadrant of right female breast, unspecified estrogen receptor status (HCC) 2022   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 Excised: 04/21/2010   Osteoarthritis of spine with radiculopathy, cervical region    Personal history of radiation therapy    Post-menopausal bleeding    Pre-diabetes    Pure hypercholesterolemia    Wears contact lenses     Past Surgical History: Past Surgical History:  Procedure Laterality Date   BIOPSY  11/11/2023   Procedure: BIOPSY;  Surgeon: Maryruth Ole DASEN, MD;  Location: [facility];  Service: Endoscopy;;   BREAST BIOPSY Right 1987, 2013   fibroadenoma x 2   BREAST BIOPSY Right 04/06/2021   u/s bx-venus clip-positive   BREAST EXCISIONAL BIOPSY Left 1978   BREAST FIBROADENOMA SURGERY     BREAST LUMPECTOMY WITH SENTINEL LYMPH NODE BIOPSY Right 05/08/2021   Procedure: BREAST LUMPECTOMY WITH SENTINEL LYMPH NODE BX;  Surgeon: Dessa Reyes ORN, MD;  Location: [facility] ORS;  Service: General;  Laterality: Right;   CARPAL TUNNEL RELEASE Right 08/28/2017   Procedure: CARPAL TUNNEL RELEASE ENDOSCOPIC;  Surgeon: Edie Norleen PARAS, MD;  Location: [facility];  Service: Orthopedics;  Laterality: Right;   CARPAL TUNNEL RELEASE Left 11/06/2017   Procedure: CARPAL TUNNEL RELEASE ENDOSCOPIC;  Surgeon: Edie Norleen PARAS, MD;  Location: [facility];  Service: Orthopedics;  Laterality: Left;    COLONOSCOPY N/A 09/26/2015   Procedure: COLONOSCOPY;  Surgeon: Lamar DASEN Holmes, MD;  Location: [facility];  Service: Endoscopy;  Laterality: N/A;   COLONOSCOPY WITH PROPOFOL  N/A 04/16/2022   Procedure: COLONOSCOPY WITH PROPOFOL ;  Surgeon: Maryruth Ole DASEN, MD;  Location: [facility];  Service: Endoscopy;  Laterality: N/A;   COLONOSCOPY WITH PROPOFOL  N/A 11/11/2023   Procedure: COLONOSCOPY WITH PROPOFOL ;  Surgeon: Maryruth Ole DASEN, MD;  Location: [facility];  Service: Endoscopy;  Laterality: N/A;   ESOPHAGOGASTRODUODENOSCOPY (EGD) WITH PROPOFOL  N/A 11/11/2023   Procedure: ESOPHAGOGASTRODUODENOSCOPY (EGD) WITH PROPOFOL ;  Surgeon: Maryruth Ole DASEN, MD;  Location: [facility];  Service: Endoscopy;  Laterality: N/A;   HYSTEROSCOPY WITH D & C N/A 02/13/2024   Procedure: DILATATION AND CURETTAGE /HYSTEROSCOPY;  Surgeon: Verdon Keen, MD;  Location: [facility] OR[facility];  Service: Gynecology;  Laterality: N/A;   TOOTH EXTRACTION      Past Gynecologic History:  Menarche: age 62 Post menopausal Sexually active.  Hx of abnormal pap smear & cryosurgery in 1996. Records not available.   OB History:  OB History  Gravida Para Term Preterm AB Living  0 0 0 0 0 0  SAB IAB Ectopic  Multiple Live Births  0 0 0 0 0    Family History: Family History  Problem Relation Age of Onset   Colon cancer Mother        dx 4s   Other Brother        CLL and skin cancers   Breast cancer Maternal Aunt 75   Lung cancer Maternal Aunt    Breast cancer Paternal Aunt 39   Pancreatic cancer Paternal Aunt    Melanoma Maternal Uncle    Breast cancer Niece        dx 30s, BRCA pos    Social History: Social History   Socioeconomic History   Marital status: Single    Spouse name: Not on file   Number of children: 0   Years of education: Not on file   Highest education level: Not on file  Occupational History   Not on file  Tobacco Use   Smoking status: Never   Smokeless tobacco: Never  Vaping  Use   Vaping status: Never Used  Substance and Sexual Activity   Alcohol use: Yes    Alcohol/week: 0.0 standard drinks of alcohol    Comment: may have a drink 3-4x/yr   Drug use: No   Sexual activity: Yes    Birth control/protection: Post-menopausal  Other Topics Concern   Not on file  Social History Narrative   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    Social Drivers of Health   Financial Resource Strain: Low Risk  (04/14/2024)   Received from [facility]   Overall Financial Resource Strain (CARDIA)    Difficulty of Paying Living Expenses: Not hard at all  Food Insecurity: No Food Insecurity (04/14/2024)   Received from [facility]   Hunger Vital Sign    Within the past 12 months, you worried that your food would run out before you got the money to buy more.: Never true    Within the past 12 months, the food you bought just didn't last and you didn't have money to get more.: Never true  Transportation Needs: No Transportation Needs (04/14/2024)   Received from [facility] - Transportation    In the past 12 months, has lack of transportation kept you from medical appointments or from getting medications?: No    Lack of Transportation (Non-Medical): No  Physical Activity: Not on file  Stress: Not on file  Social Connections: Not on file  Intimate Partner Violence: Not At Risk (03/18/2024)   Humiliation, Afraid, Rape, and Kick questionnaire    Fear of Current or Ex-Partner: No    Emotionally Abused: No    Physically Abused: No    Sexually Abused: No    Allergies: Allergies  Allergen Reactions   Wound Dressing Adhesive Itching    Sensitive to Steri strips and Band aids adhesive   Meloxicam Itching    Current Medications: Current Outpatient Medications  Medication Sig Dispense Refill   anastrozole  (ARIMIDEX ) 1 MG tablet TAKE 1 TABLET BY MOUTH DAILY (Patient taking differently: Take 1 mg by mouth at  bedtime.) 90 tablet 1   Calcium Carb-Cholecalciferol (CALCIUM 600 + D PO) Take 1 tablet by mouth 2 (two) times daily.     Coenzyme Q10 (COQ10) 100 MG CAPS Take 100 mg by mouth daily.     Collagen-Boron-Hyaluronic Acid (MOVE FREE ULTRA JOINT HEALTH PO) Take 1 tablet by mouth daily.     Cyanocobalamin  (B-12) 5000 MCG SUBL Place 5,000 mcg  under the tongue daily.     DM-Phenylephrine -Acetaminophen  10-5-325 MG TABS Take 2 tablets by mouth every 4 (four) hours as needed (congestion).     Fe Bisgly-Vit C-Vit B12-FA (GENTLE IRON ) 28-60-0.008-0.4 MG CAPS Take 1 tablet by mouth daily.     fexofenadine (ALLEGRA) 180 MG tablet Take 180 mg by mouth daily.     KRILL OIL OMEGA-3 PO Take 1 capsule by mouth daily.     Magnesium Chloride-Calcium (SLOW MAGNESIUM/CALCIUM PO) Take 1 tablet by mouth 2 (two) times daily.     Multiple Vitamin (MULTIVITAMIN) capsule Take 1 capsule by mouth daily. Centrum Silver     Multiple Vitamins-Minerals (AIRBORNE PO) Take 3 capsules by mouth daily as needed (immune support).     Multiple Vitamins-Minerals (HAIR SKIN AND NAILS FORMULA PO) Take 1 tablet by mouth 2 (two) times daily.     Pamabrom 50 MG CAPS Take 50 mg by mouth daily as needed (ankle swelling).     Polyvinyl Alcohol-Povidone (CLEAR EYES ALL SEASONS) 5-6 MG/ML SOLN Place 1 drop into both eyes daily. May use a second time as needed for dryness     rosuvastatin (CRESTOR) 5 MG tablet Take 5 mg by mouth at bedtime.     Turmeric 500 MG CAPS Take 500 mg by mouth 2 (two) times daily.     Biotin 2500 MCG CHEW Chew 2,500 mcg by mouth 2 (two) times daily. (Patient not taking: Reported on 07/15/2024)     No current facility-administered medications for this visit.   Review of Systems General: no complaints  HEENT: no complaints  Lungs: no complaints  Cardiac: no complaints  GI: no complaints  GYN: no complaints  Musculoskeletal: no complaints  Extremities: no complaints  Skin: no complaints  Neuro: no complaints   Endocrine: no complaints  Psych: no complaints         Objective:  Physical Examination:  BP 126/73   Pulse 72   Temp 97.8 F (36.6 C)   Resp 16   Wt 185 lb 14.4 oz (84.3 kg)   SpO2 97%   BMI 32.41 kg/m    GENERAL: Patient is a well appearing female in no acute distress HEENT: Normocephalic and atraumatic LUNGS:  Normal respiratory effort ABDOMEN:  Soft, nontender, nondistended. No masses or ascites EXTREMITIES:  No peripheral edema.   NEURO:  Nonfocal. Well oriented.  Appropriate affect.  Pelvic: chaperoned by CMA EGBUS: no lesions Cervix: no lesions, nontender, mobile; flush with vagina, stenotic cervical os Vagina: no lesions, no discharge or bleeding Uterus: not enlarged unable to determine size, nontender Adnexa: no palpable masses    Lab Review No labs on site  Radiologic Imaging: As per HPI      Assessment:  Meghan Harper is a 68 y.o. female diagnosed with postmenopausal bleeding, abnormal uterine findings on imaging with thickened endometrial stripe, severe cervical stenosis that precludes the ability to sample the uterus.  Negative PET scan. Repeat US  06/2024 reassuring with 2 mm stripe confirmed by radiology   S/p cryotherapy  Medical co-morbidities complicating care: h/o breast cancer, h/o melanoma in situ, pre-diabetes, Body mass index is 32.41 kg/m.  Plan:   Problem List Items Addressed This Visit       Other   Thickened endometrium - Primary   Relevant Orders   US  PELVIC COMPLETE WITH TRANSVAGINAL   We reviewed the negative US  scan results and confirmed with a second radiologist and agrees with the findings which are really reassuring.  I recommended continued surveillance  with repeat imaging with transvaginal ultrasound to assess the endometrial stripe in 3-6 months to confirm these findings.  If she has any concerning new symptoms another episode of vaginal bleeding pelvic pain or fullness or any other concerns she will contact  us .  Suggested return to clinic in 3-6 months.  The patient's diagnosis, an outline of the further diagnostic and laboratory studies which will be required, the recommendation for surgery, and alternatives were discussed with her and her accompanying family members.  All questions were answered to their satisfaction.  Tinnie Dawn, DNP, AGNP-C, AOCNP scribed this note.   I personally had a face to face interaction and evaluated the patient. I have reviewed her history and available records and have performed all portions of the physical exam. I agree with the above documentation, assessment and plan which was fully formulated by me.  Counseling was completed by me.   I personally saw the patient and performed a substantive portion of this encounter in conjunction with the listed APP as documented above.  Carlene Bickley Isidor Constable, MD   CC:  Verdon Keen, MD [address] [place],  [place] [address] [phone]

## 2024-07-20 ENCOUNTER — Other Ambulatory Visit: Payer: Self-pay | Admitting: Internal Medicine

## 2024-08-11 ENCOUNTER — Ambulatory Visit: Payer: Federal, State, Local not specified - PPO | Admitting: Dermatology

## 2024-08-11 DIAGNOSIS — L738 Other specified follicular disorders: Secondary | ICD-10-CM

## 2024-08-11 DIAGNOSIS — Z86006 Personal history of melanoma in-situ: Secondary | ICD-10-CM

## 2024-08-11 DIAGNOSIS — L578 Other skin changes due to chronic exposure to nonionizing radiation: Secondary | ICD-10-CM | POA: Diagnosis not present

## 2024-08-11 DIAGNOSIS — D2271 Melanocytic nevi of right lower limb, including hip: Secondary | ICD-10-CM

## 2024-08-11 DIAGNOSIS — W908XXA Exposure to other nonionizing radiation, initial encounter: Secondary | ICD-10-CM

## 2024-08-11 DIAGNOSIS — D225 Melanocytic nevi of trunk: Secondary | ICD-10-CM

## 2024-08-11 DIAGNOSIS — L814 Other melanin hyperpigmentation: Secondary | ICD-10-CM | POA: Diagnosis not present

## 2024-08-11 DIAGNOSIS — Z1283 Encounter for screening for malignant neoplasm of skin: Secondary | ICD-10-CM | POA: Diagnosis not present

## 2024-08-11 DIAGNOSIS — D229 Melanocytic nevi, unspecified: Secondary | ICD-10-CM

## 2024-08-11 DIAGNOSIS — L821 Other seborrheic keratosis: Secondary | ICD-10-CM

## 2024-08-11 DIAGNOSIS — I781 Nevus, non-neoplastic: Secondary | ICD-10-CM

## 2024-08-11 DIAGNOSIS — D1801 Hemangioma of skin and subcutaneous tissue: Secondary | ICD-10-CM

## 2024-08-11 NOTE — Patient Instructions (Addendum)
 Discussed cosmetic procedure, noncovered.  $60 for 1st lesion and $15 for each additional lesion if done on the same day.  Maximum charge $350.  One touch-up treatment included no charge. Discussed risks of treatment including dyspigmentation, small scar, and/or recurrence. Recommend daily broad spectrum sunscreen SPF 30+/photoprotection to treated areas once healed.    Due to recent changes in healthcare laws, you may see results of your pathology and/or laboratory studies on MyChart before the doctors have had a chance to review them. We understand that in some cases there may be results that are confusing or concerning to you. Please understand that not all results are received at the same time and often the doctors may need to interpret multiple results in order to provide you with the best plan of care or course of treatment. Therefore, we ask that you please give us  2 business days to thoroughly review all your results before contacting the office for clarification. Should we see a critical lab result, you will be contacted sooner.   If You Need Anything After Your Visit  If you have any questions or concerns for your doctor, please call our main line at [phone] and press option 4 to reach your doctor's medical assistant. If no one answers, please leave a voicemail as directed and we will return your call as soon as possible. Messages left after 4 pm will be answered the following business day.   You may also send us  a message via MyChart. We typically respond to MyChart messages within 1-2 business days.  For prescription refills, please ask your pharmacy to contact our office. Our fax number is [phone].  If you have an urgent issue when the clinic is closed that cannot wait until the next business day, you can page your doctor at the number below.    Please note that while we do our best to be available for urgent issues outside of office hours, we are not available 24/7.   If  you have an urgent issue and are unable to reach us , you may choose to seek medical care at your doctor's office, retail clinic, urgent care center, or emergency room.  If you have a medical emergency, please immediately call 911 or go to the emergency department.  Pager Numbers  - Dr. Hester: [phone]  - Dr. Jackquline: [phone]  - Dr. Claudene: [phone]   - Dr. Raymund: [phone]  In the event of inclement weather, please call our main line at [phone] for an update on the status of any delays or closures.  Dermatology Medication Tips: Please keep the boxes that topical medications come in in order to help keep track of the instructions about where and how to use these. Pharmacies typically print the medication instructions only on the boxes and not directly on the medication tubes.   If your medication is too expensive, please contact our office at [phone] option 4 or send us  a message through MyChart.   We are unable to tell what your co-pay for medications will be in advance as this is different depending on your insurance coverage. However, we may be able to find a substitute medication at lower cost or fill out paperwork to get insurance to cover a needed medication.   If a prior authorization is required to get your medication covered by your insurance company, please allow us  1-2 business days to complete this process.  Drug prices often vary depending on where the prescription is filled and some pharmacies may offer  cheaper prices.  The website www.goodrx.com contains coupons for medications through different pharmacies. The prices here do not account for what the cost may be with help from insurance (it may be cheaper with your insurance), but the website can give you the price if you did not use any insurance.  - You can print the associated coupon and take it with your prescription to the pharmacy.  - You may also stop by our office during regular business  hours and pick up a GoodRx coupon card.  - If you need your prescription sent electronically to a different pharmacy, notify our office through [facility] or by phone at [phone] option 4.     Si Usted Necesita Algo Despus de Su Visita  Tambin puede enviarnos un mensaje a travs de Clinical cytogeneticist. Por lo general respondemos a los mensajes de MyChart en el transcurso de 1 a 2 das hbiles.  Para renovar recetas, por favor pida a su farmacia que se ponga en contacto con nuestra oficina. Randi lakes de fax es [place] [phone].  Si tiene un asunto urgente cuando la clnica est cerrada y que no puede esperar hasta el siguiente da hbil, puede llamar/localizar a su doctor(a) al nmero que aparece a continuacin.   Por favor, tenga en cuenta que aunque hacemos todo lo posible para estar disponibles para asuntos urgentes fuera del horario de [place], no estamos disponibles las 24 horas del da, los 7 [address]  [address] de la [place].   Si tiene un problema urgente y no puede comunicarse con nosotros, puede optar por buscar atencin mdica  en el consultorio de su doctor(a), en una clnica privada, en un centro de atencin urgente o en una sala de emergencias.  Si tiene Engineer, drilling, por favor llame inmediatamente al 911 o vaya a la sala de emergencias.  Nmeros de bper  - Dr. Hester: [phone]  - Dra. Jackquline: [phone]  - Dr. Claudene: [phone]  - Dra. Kitts: [phone]  En caso de inclemencias del [place], por favor llame a nuestra lnea principal al [phone] para una actualizacin sobre el estado de cualquier retraso o cierre.  Consejos para la medicacin en dermatologa: Por favor, guarde las cajas en las que vienen los medicamentos de uso tpico para ayudarle a seguir las instrucciones sobre dnde y cmo usarlos. Las farmacias generalmente imprimen las instrucciones del medicamento slo en las cajas y no directamente en los tubos del [place].   Si su  medicamento es muy caro, por favor, pngase en contacto con landry rieger llamando al [phone] y presione la opcin 4 o envenos un mensaje a travs de Clinical cytogeneticist.   No podemos decirle cul ser su copago por los medicamentos por adelantado ya que esto es diferente dependiendo de la cobertura de su seguro. Sin embargo, es posible que podamos encontrar un medicamento sustituto a Audiological scientist un formulario para que el seguro cubra el medicamento que se considera necesario.   Si se requiere una autorizacin previa para que su compaa de seguros malta su medicamento, por favor permtanos de 1 a 2 das hbiles para completar este proceso.  Los precios de los medicamentos varan con frecuencia dependiendo del Environmental consultant de dnde se surte la receta y alguna farmacias pueden ofrecer precios ms baratos.  El sitio web www.goodrx.com tiene cupones para medicamentos de Health and safety inspector. Los precios aqu no tienen en cuenta lo que podra costar con la ayuda del seguro (puede ser ms barato con su seguro), pero el sitio web puede darle el  precio si no Visual merchandiser.  - Puede imprimir el cupn correspondiente y llevarlo con su receta a la farmacia.  - Tambin puede pasar por nuestra oficina durante el horario de atencin regular y Education officer, museum una tarjeta de cupones de GoodRx.  - Si necesita que su receta se enve electrnicamente a una farmacia diferente, informe a nuestra oficina a travs de MyChart de [place] o por telfono llamando al [phone] y presione la opcin 4.

## 2024-08-11 NOTE — Progress Notes (Signed)
 Follow-Up Visit   Subjective  Meghan Harper is a 68 y.o. female who presents for the following: Skin Cancer Screening and Full Body Skin Exam  The patient presents for Total-Body Skin Exam (TBSE) for skin cancer screening and mole check. The patient has spots, moles and lesions to be evaluated, some may be new or changing. She has several rough growths on the legs.     The following portions of the chart were reviewed this encounter and updated as appropriate: medications, allergies, medical history  Review of Systems:  No other skin or systemic complaints except as noted in HPI or Assessment and Plan.  Objective  Well appearing patient in no apparent distress; mood and affect are within normal limits.  A full examination was performed including scalp, head, eyes, ears, nose, lips, neck, chest, axillae, abdomen, back, buttocks, bilateral upper extremities, bilateral lower extremities, hands, feet, fingers, toes, fingernails, and toenails. All findings within normal limits unless otherwise noted below.   Relevant physical exam findings are noted in the Assessment and Plan.    Assessment & Plan   SKIN CANCER SCREENING PERFORMED TODAY.  ACTINIC DAMAGE - Chronic condition, secondary to cumulative UV/sun exposure - diffuse scaly erythematous macules with underlying dyspigmentation - Recommend daily broad spectrum sunscreen SPF 30+ to sun-exposed areas, reapply every 2 hours as needed.  - Staying in the shade or wearing long sleeves, sun glasses (UVA+UVB protection) and wide brim hats (4-inch brim around the entire circumference of the hat) are also recommended for sun protection.  - Call for new or changing lesions.  LENTIGINES, SEBORRHEIC KERATOSES, HEMANGIOMAS - Benign normal skin lesions - Benign-appearing - Call for any changes  SEBORRHEIC KERATOSIS - right medial clavicle 8 x 3 mm waxy tan macule two toned - Benign-appearing - Discussed benign etiology and  prognosis. - Observe - Call for any changes  MELANOCYTIC NEVI - Tan-brown and/or pink-flesh-colored symmetric macules and papules, including lower sternum - Left buttock 7 x 4 mm medium dark brown macule- present for yrs per pt, no changes   - Right lower calf  2 mm medium brown macule  - Benign appearing on exam today - Observation - Call clinic for new or changing moles - Recommend daily use of broad spectrum spf 30+ sunscreen to sun-exposed areas.   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  - Call if any new or changing lesions are noted between office visits  Sebaceous Hyperplasia - Small yellow papules with a central dell - Benign-appearing - Observe. Call for changes. - Discussed cosmetic ED. Discussed cosmetic procedure, noncovered.  $60 for 1st lesion and $15 for each additional lesion if done on the same day.  Maximum charge $350.  One touch-up treatment included no charge. Discussed risks of treatment including dyspigmentation, small scar, and/or recurrence. Recommend daily broad spectrum sunscreen SPF 30+/photoprotection to treated areas once healed.  TELANGIECTASIA Exam: dilated blood vessels on the nose  Treatment Plan: Benign appearing on exam Call for changes   Return in about 1 year (around 08/11/2025) for TBSE, Hx melanoma IS.  IAndrea Kerns, CMA, am acting as scribe for Rexene Rattler, MD .   Documentation: I have reviewed the above documentation for accuracy and completeness, and I agree with the above.  Rexene Rattler, MD

## 2024-10-20 ENCOUNTER — Inpatient Hospital Stay (HOSPITAL_BASED_OUTPATIENT_CLINIC_OR_DEPARTMENT_OTHER): Admitting: Internal Medicine

## 2024-10-20 ENCOUNTER — Encounter: Payer: Self-pay | Admitting: Internal Medicine

## 2024-10-20 ENCOUNTER — Inpatient Hospital Stay

## 2024-10-20 ENCOUNTER — Inpatient Hospital Stay: Attending: Internal Medicine

## 2024-10-20 VITALS — BP 126/74 | HR 83 | Temp 98.6°F | Resp 20 | Ht 63.5 in | Wt 191.0 lb

## 2024-10-20 VITALS — BP 127/65 | HR 79 | Resp 18

## 2024-10-20 DIAGNOSIS — Z170 Estrogen receptor positive status [ER+]: Secondary | ICD-10-CM

## 2024-10-20 DIAGNOSIS — R9389 Abnormal findings on diagnostic imaging of other specified body structures: Secondary | ICD-10-CM | POA: Diagnosis not present

## 2024-10-20 DIAGNOSIS — C50211 Malignant neoplasm of upper-inner quadrant of right female breast: Secondary | ICD-10-CM

## 2024-10-20 DIAGNOSIS — Z79811 Long term (current) use of aromatase inhibitors: Secondary | ICD-10-CM | POA: Diagnosis not present

## 2024-10-20 DIAGNOSIS — D509 Iron deficiency anemia, unspecified: Secondary | ICD-10-CM | POA: Insufficient documentation

## 2024-10-20 LAB — IRON AND TIBC
Iron: 119 ug/dL (ref 28–170)
Saturation Ratios: 28 % (ref 10.4–31.8)
TIBC: 419 ug/dL (ref 250–450)
UIBC: 300 ug/dL

## 2024-10-20 LAB — CBC WITH DIFFERENTIAL (CANCER CENTER ONLY)
Abs Immature Granulocytes: 0.01 K/uL (ref 0.00–0.07)
Basophils Absolute: 0.1 K/uL (ref 0.0–0.1)
Basophils Relative: 1 %
Eosinophils Absolute: 0.1 K/uL (ref 0.0–0.5)
Eosinophils Relative: 2 %
HCT: 38.2 % (ref 36.0–46.0)
Hemoglobin: 12.9 g/dL (ref 12.0–15.0)
Immature Granulocytes: 0 %
Lymphocytes Relative: 22 %
Lymphs Abs: 1.4 K/uL (ref 0.7–4.0)
MCH: 29.5 pg (ref 26.0–34.0)
MCHC: 33.8 g/dL (ref 30.0–36.0)
MCV: 87.2 fL (ref 80.0–100.0)
Monocytes Absolute: 0.6 K/uL (ref 0.1–1.0)
Monocytes Relative: 9 %
Neutro Abs: 4.2 K/uL (ref 1.7–7.7)
Neutrophils Relative %: 66 %
Platelet Count: 227 K/uL (ref 150–400)
RBC: 4.38 MIL/uL (ref 3.87–5.11)
RDW: 11.9 % (ref 11.5–15.5)
WBC Count: 6.4 K/uL (ref 4.0–10.5)
nRBC: 0 % (ref 0.0–0.2)

## 2024-10-20 LAB — CMP (CANCER CENTER ONLY)
ALT: 28 U/L (ref 0–44)
AST: 25 U/L (ref 15–41)
Albumin: 4.5 g/dL (ref 3.5–5.0)
Alkaline Phosphatase: 62 U/L (ref 38–126)
Anion gap: 12 (ref 5–15)
BUN: 25 mg/dL — ABNORMAL HIGH (ref 8–23)
CO2: 23 mmol/L (ref 22–32)
Calcium: 9.3 mg/dL (ref 8.9–10.3)
Chloride: 105 mmol/L (ref 98–111)
Creatinine: 0.64 mg/dL (ref 0.44–1.00)
GFR, Estimated: >60 mL/min
Glucose, Bld: 113 mg/dL — ABNORMAL HIGH (ref 70–99)
Potassium: 4.2 mmol/L (ref 3.5–5.1)
Sodium: 141 mmol/L (ref 135–145)
Total Bilirubin: 0.3 mg/dL (ref 0.0–1.2)
Total Protein: 6.8 g/dL (ref 6.5–8.1)

## 2024-10-20 LAB — FERRITIN: Ferritin: 24 ng/mL (ref 11–307)

## 2024-10-20 MED ORDER — IRON SUCROSE 20 MG/ML IV SOLN
200.0000 mg | Freq: Once | INTRAVENOUS | Status: AC
  Administered 2024-10-20: 200 mg via INTRAVENOUS

## 2024-10-20 NOTE — Progress Notes (Signed)
 Patient states she has had some episodes of shortness of breath.

## 2024-10-20 NOTE — Assessment & Plan Note (Signed)
#  Stage I T1c- ER positive; PR negative; her 2 negative; s/p lumpectomy clear margins [Dr,Byrnett/ Dr.Sam ]. ONCOTYPE-LOW RISK- s/p RT. June 2022. MAy 2025 [Dr.Cintron]- Bil Mamm-WNL; stable.   # ON anastrazole- tolerating well except for hot flashes- see below.  # Hot flashes grade 1-recommend monitor for now; stable.   # severe anemia- hb- 6.0 [OCT, 2024]- sec to severe  iron  studies; ferritin- colonoscopy- negative-EGD- Hiatal hernia- ? etiology. Discussed with [Dr.Locklear];Today hemoglobin is 12- ok to take extra pill along with daily gentle iron -will proceed with Venofer .  # Endometrial thickening [Dr.Beasley/ Dr.Secord]-Difficult to Biopsy.  PET 2025- NEG- continue surveillance US  in 6 months.  stable.   # BMD- OCT 2024- [Dr.Moriyati; [facility]]-The BMD measured at Femur Neck Right is 0.940 g/cm2 with a T-score of -0.7.  Continue Calcium plus vitamin D ; weightbearing exercises.   stable  q6M # DISPOSITION: # venofer  today # Follow up in 4 month- MD; cbc/cmp; iron  studies; ferritin- possible venofer -- -Dr.B

## 2024-10-20 NOTE — Patient Instructions (Signed)

## 2024-10-20 NOTE — Progress Notes (Signed)
 [facility] CONSULT NOTE  Patient Care Team: Meghan Harper, Meghan Harper as PCP - General (Physician Assistant) Meghan Arlean FALCON, RN (Inactive) as Oncology Nurse Navigator Meghan Aran, MD as Consulting Physician (Radiation Oncology) Meghan Meghan SAUNDERS, MD as Consulting Physician (Oncology) Meghan Reyes ORN, MD as Consulting Physician (General Surgery)  CHIEF COMPLAINTS/PURPOSE OF CONSULTATION: Breast cancer  Oncology History Overview Note  # June 2022- Targeted ultrasound was performed of the RIGHT upper inner breast. At 1:30 8 cm from the nipple, there is an irregular hypoechoic mass with indistinct margins. It measures approximately 10 x 5 x 6 mm. This corresponds to the site of screening mammographic concern.   Targeted ultrasound was performed of the RIGHT axilla. No suspicious axillary lymph nodes are seen.   IMPRESSION: 1. There is a 10 mm mass in the RIGHT upper inner breast which is concerning for malignancy. Recommend ultrasound-guided biopsy for definitive characterization.  DIAGNOSIS:  A. BREAST, RIGHT, 1:30 O'CLOCK 8 CM FROM NIPPLE; ULTRASOUND-GUIDED CORE  BIOPSY:  - INVASIVE MAMMARY CARCINOMA, NO SPECIAL TYPE.   Size of invasive carcinoma: 7 mm in this sample  Histologic grade of invasive carcinoma: Grade 2                       Glandular/tubular differentiation score: 3                       Nuclear pleomorphism score: 2                       Mitotic rate score: 1                       Total score: 6  Ductal carcinoma in situ: Present, intermediate grade  Lymphovascular invasion: Not identified   CASE SUMMARY: BREAST BIOMARKER TESTS  Estrogen Receptor (ER) Status: POSITIVE          Percentage of cells with nuclear positivity: Greater than 90%          Average intensity of staining: Strong   Progesterone Receptor (PgR) Status: NEGATIVE (less than 1%)          Internal control cells present and stain as expected   HER2 (by  immunohistochemistry): NEGATIVE (Score 0)  TNM Descriptors: Not applicable  pT1c  Regional Lymph Nodes Modifier: Not applicable  pN0  pM - Not applicable  #  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   #Right breast cancer status postlumpectomy-Oncotype low risk; no chemotherapy.  # NOV 14th, 2022-start anastrozole  for 5 years.   Carcinoma of upper-inner quadrant of right breast in female, estrogen receptor positive (HCC)  04/18/2021 Initial Diagnosis   Carcinoma of upper-inner quadrant of right breast in female, estrogen receptor positive (HCC)    Genetic Testing   Negative genetic testing. No pathogenic variants identified on the [employer] CancerNext-Expanded+RNA Panel. The report date is 04/30/2021.   The CancerNext-Expanded + RNAinsight gene panel offered by [employer] and includes sequencing and rearrangement analysis for the following 77 genes: IP, ALK, APC*, ATM*, AXIN2, BAP1, BARD1, BLM, BMPR1A, BRCA1*, BRCA2*, BRIP1*, CDC73, CDH1*,CDK4, CDKN1B, CDKN2A, CHEK2*, CTNNA1, DICER1, FANCC, FH, FLCN, GALNT12, KIF1B, LZTR1, MAX, MEN1, MET, MLH1*, MSH2*, MSH3, MSH6*, MUTYH*, NBN, NF1*, NF2, NTHL1, PALB2*, PHOX2B, PMS2*, POT1, PRKAR1A, PTCH1, PTEN*, RAD51C*, RAD51D*,RB1, RECQL, RET, SDHA, SDHAF2, SDHB, SDHC, SDHD, SMAD4, SMARCA4, SMARCB1, SMARCE1, STK11, SUFU, TMEM127, TP53*,TSC1, TSC2, VHL and XRCC2 (sequencing and deletion/duplication); EGFR, EGLN1, HOXB13,  KIT, MITF, PDGFRA, POLD1 and POLE (sequencing only); EPCAM and GREM1 (deletion/duplication only).   05/25/2021 Cancer Staging   Staging form: Breast, AJCC 8th Edition - Pathologic: Stage IA (pT1c, pN0, cM0, G2, ER+, PR-, HER2-) - Signed by Meghan Meghan SAUNDERS, MD on 05/25/2021 Histologic grading system: 3 grade system     HISTORY OF PRESENTING ILLNESS: Ambulating independently.  Alone.  Meghan Harper 67 y.o.  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 Hiatal hernia on oral iron  is here for a follow-up.  Discussed the use of AI scribe software for clinical note transcription with the patient, who gave verbal consent to proceed.  History of Present Illness   Meghan Harper is a 68 year old female with ER-positive right breast cancer and iron  deficiency anemia who presents for routine hematology/oncology follow-up.  She is currently receiving adjuvant anastrozole  for ER-positive right breast cancer. Her most recent mammogram in May was unremarkable. She experiences hot flashes, which are more frequent in warm weather but remain tolerable and improve with colder temperatures. She is under ongoing gynecologic surveillance, with a pelvic ultrasound scheduled.  She has iron  deficiency anemia with a gradual decline in hemoglobin over the past year (most recently 12.3 g/dL in December, previously 12.9 and 13). She takes gentle, slow-release oral iron  daily without gastrointestinal side effects. She describes intermittent dyspnea, which has improved, and occasional fatigue and weakness. She has increased her dietary intake of red meat, which she feels has been beneficial, but prefers not to continue this change. She inquires about increasing oral iron  supplementation to twice daily if symptoms worsen. Her last iron  infusion was approximately one year ago.       Review of Systems  Constitutional:  Positive for malaise/fatigue. Negative for chills, diaphoresis and fever.  HENT:  Negative for nosebleeds and sore throat.   Eyes:  Negative for double vision.  Respiratory:  Positive for shortness of breath. Negative for hemoptysis, sputum production and wheezing.   Cardiovascular:  Negative for chest pain, palpitations, orthopnea and leg swelling.  Gastrointestinal:  Negative for blood in stool, constipation, diarrhea, heartburn, melena and vomiting.  Genitourinary:  Negative for dysuria, frequency and urgency.  Musculoskeletal:   Positive for joint pain. Negative for back pain.  Skin: Negative.  Negative for itching and rash.  Neurological:  Negative for dizziness, tingling, focal weakness, weakness and headaches.  Endo/Heme/Allergies:  Does not bruise/bleed easily.  Psychiatric/Behavioral:  Negative for depression. The patient is not nervous/anxious and does not have insomnia.      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 Excised: 04/21/2010   Osteoarthritis of spine with radiculopathy, cervical region    Personal history of radiation therapy  Post-menopausal bleeding    Pre-diabetes    Pure hypercholesterolemia    Wears contact lenses     SURGICAL HISTORY: Past Surgical History:  Procedure Laterality Date   BIOPSY  11/11/2023   Procedure: BIOPSY;  Surgeon: Maryruth Ole DASEN, MD;  Location: [facility];  Service: Endoscopy;;   BREAST BIOPSY Right 1987, 2013   fibroadenoma x 2   BREAST BIOPSY Right 04/06/2021   u/s bx-venus clip-positive   BREAST EXCISIONAL BIOPSY Left 1978   BREAST FIBROADENOMA SURGERY     BREAST LUMPECTOMY WITH SENTINEL LYMPH NODE BIOPSY Right 05/08/2021   Procedure: BREAST LUMPECTOMY WITH SENTINEL LYMPH NODE BX;  Surgeon: Meghan Reyes ORN, MD;  Location: [facility] OR[facility];  Service: General;  Laterality: Right;   CARPAL TUNNEL RELEASE Right 08/28/2017   Procedure: CARPAL TUNNEL  RELEASE ENDOSCOPIC;  Surgeon: Edie Norleen PARAS, MD;  Location: [facility];  Service: Orthopedics;  Laterality: Right;   CARPAL TUNNEL RELEASE Left 11/06/2017   Procedure: CARPAL TUNNEL RELEASE ENDOSCOPIC;  Surgeon: Edie Norleen PARAS, MD;  Location: [facility];  Service: Orthopedics;  Laterality: Left;   COLONOSCOPY N/A 09/26/2015   Procedure: COLONOSCOPY;  Surgeon: Lamar DASEN Holmes, MD;  Location: [facility];  Service: Endoscopy;  Laterality: N/A;   COLONOSCOPY WITH PROPOFOL  N/A 04/16/2022   Procedure: COLONOSCOPY WITH PROPOFOL ;  Surgeon: Maryruth Ole DASEN, MD;  Location: [facility];  Service: Endoscopy;  Laterality: N/A;   COLONOSCOPY WITH PROPOFOL  N/A 11/11/2023   Procedure: COLONOSCOPY WITH PROPOFOL ;  Surgeon: Maryruth Ole DASEN, MD;  Location: [facility];  Service: Endoscopy;  Laterality: N/A;   ESOPHAGOGASTRODUODENOSCOPY (EGD) WITH PROPOFOL  N/A 11/11/2023   Procedure: ESOPHAGOGASTRODUODENOSCOPY (EGD) WITH PROPOFOL ;  Surgeon: Maryruth Ole DASEN, MD;  Location: [facility];  Service: Endoscopy;  Laterality: N/A;   HYSTEROSCOPY WITH D & C N/A 02/13/2024   Procedure: DILATATION AND CURETTAGE /HYSTEROSCOPY;  Surgeon: Verdon Keen, MD;  Location: [facility];  Service: Gynecology;  Laterality: N/A;   TOOTH EXTRACTION      SOCIAL HISTORY: Social History   Socioeconomic History   Marital status: Single    Spouse name: Not on file   Number of children: 0   Years of education: Not on file   Highest education level: Not on file  Occupational History   Not on file  Tobacco Use   Smoking status: Never   Smokeless tobacco: Never  Vaping Use   Vaping status: Never Used  Substance and Sexual Activity   Alcohol use: Yes    Alcohol/week: 0.0 standard drinks of alcohol    Comment: may have a drink 3-4x/yr   Drug use: No   Sexual activity: Yes    Birth control/protection: Post-menopausal  Other Topics Concern   Not on file  Social History Narrative   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    Social Drivers of Health   Tobacco Use: Low Risk (10/20/2024)   Patient History    Smoking Tobacco Use: Never    Smokeless Tobacco Use: Never    Passive Exposure: Not on file  Financial Resource Strain: Low Risk  (04/14/2024)   Received from [facility]   Overall Financial Resource Strain (CARDIA)    Difficulty of Paying Living Expenses: Not hard at all  Food Insecurity: No Food Insecurity (04/14/2024)   Received from [facility]   Epic    Within the past 12 months, you worried that your food would run out before you got the money to buy more.: Never  true    Within the past 12 months, the food you bought just didn't last and you didn't have money to get more.: Never true  Transportation Needs: No Transportation Needs (04/14/2024)   Received from [facility] - Transportation    In the past 12 months, has lack of transportation kept you from medical appointments or from getting medications?: No    Lack of Transportation (Non-Medical): No  Physical Activity: Not on file  Stress: Not on file  Social Connections: Not on file  Intimate Partner Violence: Not At Risk (03/18/2024)   Humiliation, Afraid, Rape, and Kick questionnaire    Fear of Current or Ex-Partner: No    Emotionally Abused: No    Physically Abused: No    Sexually Abused: No  Depression (PHQ2-9): Not on file  Alcohol Screen: Not on file  Housing: Low Risk  (04/14/2024)   Received [facility]   Epic    At any time in the past 12 months, were you homeless or living in a shelter (including now)?: No    In the past 12 months, how many times have you moved where you were living?: 0    In the last 12 months, was there a time when you were not able to pay the mortgage or rent on time?: No  Utilities: Not At Risk (04/14/2024)   Received from [facility]   Epic    In the past 12 months has the  electric, gas, oil, or water  company threatened to shut off services in your home?: No  Health Literacy: Not on file    FAMILY HISTORY: Family History  Problem Relation Age of Onset   Colon cancer Mother        dx 55s   Other Brother        CLL and skin cancers   Breast cancer Maternal Aunt 60   Lung cancer Maternal Aunt    Breast cancer Paternal Aunt 45   Pancreatic cancer Paternal Aunt    Melanoma Maternal Uncle    Breast cancer Niece        dx 30s, BRCA pos    ALLERGIES:  is allergic to wound dressing adhesive and meloxicam.  MEDICATIONS:  Current Outpatient Medications  Medication Sig Dispense Refill   anastrozole  (ARIMIDEX ) 1 MG tablet Take 1 tablet (1 mg total) by mouth at bedtime. 90 tablet 1   Calcium Carb-Cholecalciferol (CALCIUM 600 + D PO) Take 1 tablet by mouth 2 (two) times daily.     Coenzyme Q10 (COQ10) 100 MG CAPS Take 100 mg by mouth daily.     Collagen-Boron-Hyaluronic Acid (MOVE FREE ULTRA JOINT HEALTH PO) Take 1 tablet by mouth daily.     Cyanocobalamin  (B-12) 5000 MCG SUBL Place 5,000 mcg under the tongue daily.     DM-Phenylephrine -Acetaminophen  10-5-325 MG TABS Take 2 tablets by mouth every 4 (four) hours as needed (congestion).     Fe Bisgly-Vit C-Vit B12-FA (GENTLE IRON ) 28-60-0.008-0.4 MG CAPS Take 1 tablet by mouth daily.     fexofenadine (ALLEGRA) 180 MG tablet Take 180 mg by mouth daily.     KRILL OIL OMEGA-3 PO Take 1 capsule by mouth daily.     Magnesium Glycinate 100 MG CAPS      Multiple Vitamin (MULTIVITAMIN) capsule Take 1 capsule by mouth daily. Centrum Silver     Multiple Vitamins-Minerals (AIRBORNE PO) Take 3 capsules by mouth daily as needed (immune support).  Multiple Vitamins-Minerals (HAIR SKIN AND NAILS FORMULA PO) Take 1 tablet by mouth 2 (two) times daily.     Pamabrom 50 MG CAPS Take 50 mg by mouth daily as needed (ankle swelling).     Polyvinyl Alcohol-Povidone (CLEAR EYES ALL SEASONS) 5-6 MG/ML SOLN Place 1 drop into both  eyes daily. May use a second time as needed for dryness     rosuvastatin (CRESTOR) 5 MG tablet Take 5 mg by mouth at bedtime.     Turmeric 500 MG CAPS Take 500 mg by mouth 2 (two) times daily.     Magnesium Chloride-Calcium (SLOW MAGNESIUM/CALCIUM PO) Take 1 tablet by mouth 2 (two) times daily. (Patient not taking: Reported on 10/20/2024)     No current facility-administered medications for this visit.      SABRA  PHYSICAL EXAMINATION: ECOG PERFORMANCE STATUS: 0 - Asymptomatic  Vitals:   10/20/24 1000  BP: 126/74  Pulse: 83  Resp: 20  Temp: 98.6 F (37 C)  SpO2: 100%     Filed Weights   10/20/24 1000  Weight: 191 lb (86.6 kg)      Physical Exam Vitals and nursing note reviewed.  HENT:     Head: Normocephalic and atraumatic.     Mouth/Throat:     Pharynx: Oropharynx is clear.  Eyes:     Extraocular Movements: Extraocular movements intact.     Pupils: Pupils are equal, round, and reactive to light.  Cardiovascular:     Rate and Rhythm: Normal rate and regular rhythm.  Pulmonary:     Comments: Decreased breath sounds bilaterally.  Abdominal:     Palpations: Abdomen is soft.  Musculoskeletal:        General: Normal range of motion.     Cervical back: Normal range of motion.  Skin:    General: Skin is warm.  Neurological:     General: No focal deficit present.     Mental Status: She is alert and oriented to person, place, and time.  Psychiatric:        Behavior: Behavior normal.        Judgment: Judgment normal.      LABORATORY DATA:  I have reviewed the data as listed Lab Results  Component Value Date   WBC 6.4 10/20/2024   HGB 12.9 10/20/2024   HCT 38.2 10/20/2024   MCV 87.2 10/20/2024   PLT 227 10/20/2024   Recent Labs    12/17/23 1410 04/15/24 1349  NA 137 133*  K 4.4 3.9  CL 103 100  CO2 26 27  GLUCOSE 113* 113*  BUN 18 17  CREATININE 0.53 0.71  CALCIUM 9.0 9.1  GFRNONAA >60 >60  PROT 6.8 6.7  ALBUMIN 4.1 4.2  AST 25 24  ALT 28 22   ALKPHOS 48 55  BILITOT 0.5 0.7     RADIOGRAPHIC STUDIES: I have personally reviewed the radiological images as listed and agreed with the findings in the report. No results found.   ASSESSMENT & PLAN:   Carcinoma of upper-inner quadrant of right breast in female, estrogen receptor positive (HCC) #Stage I T1c- ER positive; PR negative; her 2 negative; s/p lumpectomy clear margins [Dr,Byrnett/ Dr.Sam ]. ONCOTYPE-LOW RISK- s/p RT. June 2022. MAy 2025 [Dr.Cintron]- Bil Mamm-WNL; stable.   # ON anastrazole- tolerating well except for hot flashes- see below.  # Hot flashes grade 1-recommend monitor for now; stable.   # severe anemia- hb- 6.0 [OCT, 2024]- sec to severe  iron  studies; ferritin- colonoscopy- negative-EGD- Hiatal hernia- ?  etiology. Discussed with [Dr.Locklear];Today hemoglobin is 12- ok to take extra pill along with daily gentle iron -will proceed with Venofer .  # Endometrial thickening [Dr.Beasley/ Dr.Secord]-Difficult to Biopsy.  PET 2025- NEG- continue surveillance US  in 6 months.  stable.   # BMD- OCT 2024- [Dr.Moriyati; [facility]]-The BMD measured at Femur Neck Right is 0.940 g/cm2 with a T-score of -0.7.  Continue Calcium plus vitamin D ; weightbearing exercises.   stable  q6M # DISPOSITION: # venofer  today # Follow up in 4 month- MD; cbc/cmp; iron  studies; ferritin- possible venofer -- -Dr.B   All questions were answered. The patient/family knows to call the clinic with any problems, questions or concerns.    Meghan JONELLE Joe, MD 10/20/2024 10:30 AM

## 2024-10-26 ENCOUNTER — Ambulatory Visit
Admission: RE | Admit: 2024-10-26 | Discharge: 2024-10-26 | Disposition: A | Discharge status: Home / Self Care | Source: Ambulatory Visit | Attending: Nurse Practitioner | Admitting: Nurse Practitioner

## 2024-10-26 DIAGNOSIS — R9389 Abnormal findings on diagnostic imaging of other specified body structures: Secondary | ICD-10-CM | POA: Insufficient documentation

## 2024-11-03 ENCOUNTER — Telehealth: Payer: Self-pay

## 2024-11-03 NOTE — Telephone Encounter (Signed)
 Radiology called for read on 10/26/24 US  prior to appointment tomorrow.

## 2024-11-04 ENCOUNTER — Inpatient Hospital Stay: Attending: Internal Medicine | Admitting: Obstetrics and Gynecology

## 2024-11-04 VITALS — BP 140/59 | HR 92 | Temp 98.9°F | Ht 63.5 in | Wt 193.0 lb

## 2024-11-04 DIAGNOSIS — N950 Postmenopausal bleeding: Secondary | ICD-10-CM | POA: Insufficient documentation

## 2024-11-04 DIAGNOSIS — R9389 Abnormal findings on diagnostic imaging of other specified body structures: Secondary | ICD-10-CM | POA: Insufficient documentation

## 2024-11-04 DIAGNOSIS — Z79811 Long term (current) use of aromatase inhibitors: Secondary | ICD-10-CM | POA: Insufficient documentation

## 2024-11-04 DIAGNOSIS — N882 Stricture and stenosis of cervix uteri: Secondary | ICD-10-CM | POA: Insufficient documentation

## 2024-11-04 DIAGNOSIS — C50919 Malignant neoplasm of unspecified site of unspecified female breast: Secondary | ICD-10-CM | POA: Insufficient documentation

## 2024-11-04 NOTE — Progress Notes (Signed)
 Gynecologic Oncology Interval Visit   Referring Provider:   Chief Complaint: Possible thickened endometrial stripe, cervical stenosis  Subjective:  Meghan Harper is a 69 y.o. GOPO female who is seen in consultation from Dr. 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  No bleeding or other complaints. Continues on AI for breast cancer.  TVUS 10/26/24 Uterus Measurements: 6.2 cm in sagittal dimension. Heterogeneously hypoechoic mass expanding the fundus measures 3.5 x 3.5 x 3.4 cm, previously 3.8 x 3.6 x 3.2 cm, not substantially changed in size allowing for differences in technique.   Endometrium Thickness: 2 mm posterior to the fundal mass and within the lower uterine segment. Again seen is trace fluid within the lower uterine segment/endocervical canal.   Right ovary Not seen.   Left ovary Not seen.   Other findings:  No abnormal free fluid.   IMPRESSION: 1. No substantial change in size of fundal intramural mass, likely leiomyoma. 2. Unchanged endometrial thickness measuring 2 mm. 3. Ovaries are not seen due to bowel gas.     Gynecologic Oncology History: Meghan Harper was seen in consultation from Dr. 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  Patient has history of abnormal pap in 1996 and underwent cryosurgery. Follow up paps have been negative/normal and negative for hpv. Last pap 10/24/21- NILM  She saw Dr Verdon in February 2025 for PMB, approximately 1 tablespoon of blood. Workup included thickened endometrium- 18.9 mm. Unable to dilate cervix for EMBx.   02/13/24- D&C/Hysteroscopy performed for thickened endometrial stripe and PMB  Op Note: An endocervical curette was performed to a depth of 3cm. Her cervix was serially dilated to 15 French using Hanks dilators. The hysteroscope was introduced under direct observation to allow visualization of the cervical  canal. Using lactated ringers  as a distention medium to see, the canal was unable to be clearly visualized, but a false passage was suspected. I continued with vaginoscopy without success at finding the cervical passage, but was concerned that the false passage might lead into the abdomen and therefore after a thorough view, abandoned looking.   1. Endocervix, curettage:       - FEW FRAGMENTS OF METAPLASTIC SQUAMOUS EPITHELIUM.       - NEGATIVE FOR DYSPLASIA AND MALIGNANCY.   Patient wished to avoid hysterectomy d/t concern of prolapse or bladder issues long term. 4 mo u/s for monitoring of ES was discussed vs gyn onc. She elected to be seen by gyn onc for second opinion.   We recommended a PET scan for further evaluation.  03/24/2024 PET Initial  No specific areas of abnormal radiotracer uptake.  No abnormal uptake in the area of the uterus this time. Large hiatal hernia. Mild uptake along herniated stomach, nonspecific   She desired continued close surveillance.   06/29/2024 US  Pelvis, Complete Transvaginal and Transabdominal without Doppler UTERUS: The uterus is anteverted. The uterus measures 5.8 x 4.2 x 4.2 cm with a volume of 53 cc. A solitary uterine mass is identified within the anterior fundus which is mildly heterogeneously hypoechoic and well circumscribed in keeping with an intramural/submucosal uterine fibroid. This mass measures 3.6 x 3.2 x 3.8 cm. This demonstrates mass effect upon the endometrium.   ENDOMETRIAL STRIPE: The endometrium appears uniform measuring up to 2 mm in thickness.   RIGHT OVARY: Not visualized.   LEFT OVARY: Not visualized.   FREE FLUID: A small amount of simple-appearing free fluid is  seen within the endocervical canal. No free fluid within the pelvis.   IMPRESSION: 1. Intramural/submucosal uterine fibroid in the anterior fundus measuring 3.6 x 3.2 x 3.8 cm, causing mass effect on the endometrium. 2. Uniform endometrium measuring up to 2 mm in  thickness.  History:  She has a history of iron  deficiency anemia and breast cancer s/p lumpectomy 2022, on Aromatase inhibitor.   Problem List: Patient Active Problem List   Diagnosis Date Noted   Thickened endometrium 03/18/2024   Genetic testing 05/09/2021   Family history of breast cancer 04/18/2021   Family history of pancreatic cancer 04/18/2021   Family history of colon cancer 04/18/2021   Family history of melanoma 04/18/2021   Family history of CLL (chronic lymphoid leukemia) 04/18/2021   Carcinoma of upper-inner quadrant of right breast in female, estrogen receptor positive (HCC) 04/18/2021   Prediabetes 08/02/2017   Postmenopausal bleeding 08/02/2017   Allergic rhinitis 05/05/2017   Primary osteoarthritis of both hands 02/27/2016   Osteoarthritis of spine with radiculopathy, cervical region 02/27/2016   Pure hypercholesterolemia 02/27/2016    Past Medical History: Past Medical History:  Diagnosis Date   Arthritis    Bilateral carpal tunnel syndrome    Breast cancer (HCC)    right 2022   Family history of breast cancer    Family history of CLL (chronic lymphoid leukemia)    Family history of colon cancer    Family history of melanoma    Family history of pancreatic cancer    Heart murmur    History of hiatal hernia    Iron  deficiency anemia    Irritable bowel syndrome with diarrhea    Malignant neoplasm of upper-inner quadrant of right female breast, unspecified estrogen receptor status (HCC) 2022   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 Excised: 04/21/2010   Osteoarthritis of spine with radiculopathy, cervical region    Personal history of radiation therapy    Post-menopausal bleeding    Pre-diabetes    Pure hypercholesterolemia    Wears contact lenses     Past Surgical History: Past Surgical History:  Procedure Laterality Date   BIOPSY  11/11/2023   Procedure: BIOPSY;  Surgeon: Maryruth Ole DASEN, MD;   Location: [facility];  Service: Endoscopy;;   BREAST BIOPSY Right 1987, 2013   fibroadenoma x 2   BREAST BIOPSY Right 04/06/2021   u/s bx-venus clip-positive   BREAST EXCISIONAL BIOPSY Left 1978   BREAST FIBROADENOMA SURGERY     BREAST LUMPECTOMY WITH SENTINEL LYMPH NODE BIOPSY Right 05/08/2021   Procedure: BREAST LUMPECTOMY WITH SENTINEL LYMPH NODE BX;  Surgeon: Dessa Reyes ORN, MD;  Location: [facility] ORS;  Service: General;  Laterality: Right;   CARPAL TUNNEL RELEASE Right 08/28/2017   Procedure: CARPAL TUNNEL RELEASE ENDOSCOPIC;  Surgeon: Edie Norleen PARAS, MD;  Location: [facility];  Service: Orthopedics;  Laterality: Right;   CARPAL TUNNEL RELEASE Left 11/06/2017   Procedure: CARPAL TUNNEL RELEASE ENDOSCOPIC;  Surgeon: Edie Norleen PARAS, MD;  Location: [facility];  Service: Orthopedics;  Laterality: Left;   COLONOSCOPY N/A 09/26/2015   Procedure: COLONOSCOPY;  Surgeon: Lamar DASEN Holmes, MD;  Location: [facility];  Service: Endoscopy;  Laterality: N/A;   COLONOSCOPY WITH PROPOFOL  N/A 04/16/2022   Procedure: COLONOSCOPY WITH PROPOFOL ;  Surgeon: Maryruth Ole DASEN, MD;  Location: [facility];  Service: Endoscopy;  Laterality: N/A;   COLONOSCOPY WITH PROPOFOL  N/A 11/11/2023   Procedure: COLONOSCOPY WITH PROPOFOL ;  Surgeon: Maryruth,  Ole DASEN, MD;  Location: [facility];  Service: Endoscopy;  Laterality: N/A;   ESOPHAGOGASTRODUODENOSCOPY (EGD) WITH PROPOFOL  N/A 11/11/2023   Procedure: ESOPHAGOGASTRODUODENOSCOPY (EGD) WITH PROPOFOL ;  Surgeon: Maryruth Ole DASEN, MD;  Location: [facility];  Service: Endoscopy;  Laterality: N/A;   HYSTEROSCOPY WITH D & C N/A 02/13/2024   Procedure: DILATATION AND CURETTAGE /HYSTEROSCOPY;  Surgeon: Verdon Keen, MD;  Location: [facility];  Service: Gynecology;  Laterality: N/A;   TOOTH EXTRACTION      Past Gynecologic History:  Menarche: age 12 Post menopausal Sexually active.  Hx of abnormal pap smear & cryosurgery in 1996.  Records not available.   OB History:  OB History  Gravida Para Term Preterm AB Living  0 0 0 0 0 0  SAB IAB Ectopic Multiple Live Births  0 0 0 0 0    Family History: Family History  Problem Relation Age of Onset   Colon cancer Mother        dx 54s   Other Brother        CLL and skin cancers   Breast cancer Maternal Aunt 43   Lung cancer Maternal Aunt    Breast cancer Paternal Aunt 49   Pancreatic cancer Paternal Aunt    Melanoma Maternal Uncle    Breast cancer Niece        dx 30s, BRCA pos    Social History: Social History   Socioeconomic History   Marital status: Single    Spouse name: Not on file   Number of children: 0   Years of education: Not on file   Highest education level: Not on file  Occupational History   Not on file  Tobacco Use   Smoking status: Never   Smokeless tobacco: Never  Vaping Use   Vaping status: Never Used  Substance and Sexual Activity   Alcohol use: Yes    Alcohol/week: 0.0 standard drinks of alcohol    Comment: may have a drink 3-4x/yr   Drug use: No   Sexual activity: Yes    Birth control/protection: Post-menopausal  Other Topics Concern   Not on file  Social History Narrative   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    Social Drivers of Health   Tobacco Use: Low Risk (11/04/2024)   Patient History    Smoking Tobacco Use: Never    Smokeless Tobacco Use: Never    Passive Exposure: Not on file  Financial Resource Strain: Low Risk  (10/27/2024)   Received from [facility]   Overall Financial Resource Strain (CARDIA)    Difficulty of Paying Living Expenses: Not hard at all  Food Insecurity: No Food Insecurity (10/27/2024)   Received from [facility]   Epic    Within the past 12 months, you worried that your food would run out before you got the money to buy more.: Never true    Within the past 12 months, the food you bought just didn't last and you didn't have money to get more.: Never  true  Transportation Needs: No Transportation Needs (10/27/2024)   Received from [facility] - Transportation    In the past 12 months, has lack of transportation kept you from medical appointments or from getting medications?: No    Lack of Transportation (Non-Medical): No  Physical Activity: Not on file  Stress: Not on file  Social Connections: Not on file  Intimate Partner Violence: Not At Risk (03/18/2024)   Humiliation, Afraid,  Rape, and Kick questionnaire    Fear of Current or Ex-Partner: No    Emotionally Abused: No    Physically Abused: No    Sexually Abused: No  Depression (PHQ2-9): Low Risk (10/20/2024)   Depression (PHQ2-9)    PHQ-2 Score: 0  Alcohol Screen: Not on file  Housing: Low Risk  (10/27/2024)   Received from [facility]   Epic    In the last 12 months, was there a time when you were not able to pay the mortgage or rent on time?: No    In the past 12 months, how many times have you moved where you were living?: 0    At any time in the past 12 months, were you homeless or living in a shelter (including now)?: No  Utilities: Not At Risk (10/27/2024)   Received from [facility]   Epic    In the past 12 months has the electric, gas, oil, or water  company threatened to shut off services in your home?: No  Health Literacy: Not on file    Allergies: Allergies  Allergen Reactions   Wound Dressing Adhesive Itching    Sensitive to Steri strips and Band aids adhesive   Meloxicam Itching    Current Medications: Current Outpatient Medications  Medication Sig Dispense Refill   anastrozole  (ARIMIDEX ) 1 MG tablet Take 1 tablet (1 mg total) by mouth at bedtime. 90 tablet 1   Calcium Carb-Cholecalciferol (CALCIUM 600 + D PO) Take 1 tablet by mouth 2 (two) times daily.     Coenzyme Q10 (COQ10) 100 MG CAPS Take 100 mg by mouth daily.     Collagen-Boron-Hyaluronic Acid (MOVE FREE ULTRA JOINT HEALTH PO) Take 1 tablet  by mouth daily.     Cyanocobalamin  (B-12) 5000 MCG SUBL Place 5,000 mcg under the tongue daily.     DM-Phenylephrine -Acetaminophen  10-5-325 MG TABS Take 2 tablets by mouth every 4 (four) hours as needed (congestion).     Fe Bisgly-Vit C-Vit B12-FA (GENTLE IRON ) 28-60-0.008-0.4 MG CAPS Take 1 tablet by mouth daily.     fexofenadine (ALLEGRA) 180 MG tablet Take 180 mg by mouth daily.     KRILL OIL OMEGA-3 PO Take 1 capsule by mouth daily.     Magnesium Chloride-Calcium (SLOW MAGNESIUM/CALCIUM PO) Take 1 tablet by mouth 2 (two) times daily. (Patient not taking: Reported on 10/20/2024)     Magnesium Glycinate 100 MG CAPS      Multiple Vitamin (MULTIVITAMIN) capsule Take 1 capsule by mouth daily. Centrum Silver     Multiple Vitamins-Minerals (AIRBORNE PO) Take 3 capsules by mouth daily as needed (immune support).     Multiple Vitamins-Minerals (HAIR SKIN AND NAILS FORMULA PO) Take 1 tablet by mouth 2 (two) times daily.     Pamabrom 50 MG CAPS Take 50 mg by mouth daily as needed (ankle swelling).     Polyvinyl Alcohol-Povidone (CLEAR EYES ALL SEASONS) 5-6 MG/ML SOLN Place 1 drop into both eyes daily. May use a second time as needed for dryness     rosuvastatin (CRESTOR) 5 MG tablet Take 5 mg by mouth at bedtime.     Turmeric 500 MG CAPS Take 500 mg by mouth 2 (two) times daily.     No current facility-administered medications for this visit.   Review of Systems General:  no complaints Skin: no complaints Eyes: no complaints HEENT: no complaints Breasts: no complaints Pulmonary: no complaints Cardiac: no complaints Gastrointestinal: no complaints Genitourinary/Sexual: no complaints Ob/Gyn: no complaints  Musculoskeletal: no complaints Hematology: no complaints Neurologic/Psych: no complaints   Objective:  Physical Examination:  Ht 5' 3.5 (1.613 m)   BMI 33.30 kg/m    GENERAL: Patient is a well appearing female in no acute distress HEENT: Normocephalic and atraumatic LUNGS:  Normal  respiratory effort ABDOMEN:  Soft, nontender, nondistended. No masses or ascites EXTREMITIES:  No peripheral edema.   NEURO:  Nonfocal. Well oriented.  Appropriate affect.  Pelvic: per last exam EGBUS: no lesions Cervix: no lesions, nontender, mobile; flush with vagina, stenotic cervical os Vagina: no lesions, no discharge or bleeding Uterus: not enlarged unable to determine size, nontender Adnexa: no palpable masses  Lab Review No labs on site  Radiologic Imaging: As per HPI    Assessment:  Veanna Dower is a 69 y.o. G0 female diagnosed with one episode of postmenopausal bleeding 2/25, abnormal uterine findings on imaging with thickened endometrial stripe and fibroid, severe cervical stenosis precluded the ability to sample the uterus on D&C.  Negative PET scan. Repeat US  06/2024 reassuring with 2 mm stripe confirmed by radiology.  US  1/26 unchanged.  No new bleeding or other symptoms.     s/p cervical cryotherapy  Medical co-morbidities complicating care: h/o breast cancer, h/o melanoma in situ, pre-diabetes, Body mass index is 33.3 kg/m.  Plan:   Problem List Items Addressed This Visit       Other   Postmenopausal bleeding - Primary   Thickened endometrium    We reviewed the stable US  results which is reassuring.  Told her she does not need to follow up routinely, just in the event of new symptoms or bleeding.   She will follow up with her PCP.    The patient's diagnosis, an outline of the further diagnostic and laboratory studies which will be required, the recommendation for surgery, and alternatives were discussed with her and her accompanying family members.  All questions were answered to their satisfaction.  Tinnie Dawn, DNP, AGNP-C, AOCNP scribed this note.   I personally had a face to face interaction and evaluated the patient jointly with the NP, Ms. Tinnie Dawn.  I have reviewed her history and available records and have performed the key portions of the  physical exam including lymph node survey, abdominal exam, pelvic exam with my findings confirming those documented above by the APP.  I have discussed the case with the APP and the patient.  I agree with the above documentation, assessment and plan which was fully formulated by me.  Counseling was completed by me.   I personally saw the patient and performed a substantive portion of this encounter in conjunction with the listed APP as documented above.  Prentice Agent, MD   CC:  Verdon Keen, MD [address] [place],  [place] [address] [phone]

## 2025-02-17 ENCOUNTER — Inpatient Hospital Stay

## 2025-02-17 ENCOUNTER — Inpatient Hospital Stay: Admitting: Internal Medicine

## 2025-08-10 ENCOUNTER — Ambulatory Visit: Admitting: Dermatology
